# Patient Record
Sex: Male | Born: 1945 | Race: White | Hispanic: No | Marital: Married | Smoking: Former smoker
Health system: Southern US, Community
[De-identification: ages and names within clinical notes are randomized; demographics above are authoritative.]

## PROBLEM LIST (undated history)

## (undated) DIAGNOSIS — E785 Hyperlipidemia, unspecified: Secondary | ICD-10-CM

## (undated) DIAGNOSIS — N189 Chronic kidney disease, unspecified: Secondary | ICD-10-CM

## (undated) DIAGNOSIS — E119 Type 2 diabetes mellitus without complications: Secondary | ICD-10-CM

## (undated) DIAGNOSIS — K56609 Unspecified intestinal obstruction, unspecified as to partial versus complete obstruction: Secondary | ICD-10-CM

## (undated) DIAGNOSIS — N200 Calculus of kidney: Secondary | ICD-10-CM

## (undated) DIAGNOSIS — M1990 Unspecified osteoarthritis, unspecified site: Secondary | ICD-10-CM

## (undated) DIAGNOSIS — I2510 Atherosclerotic heart disease of native coronary artery without angina pectoris: Secondary | ICD-10-CM

## (undated) DIAGNOSIS — I10 Essential (primary) hypertension: Secondary | ICD-10-CM

## (undated) HISTORY — PX: ELBOW SURGERY: SHX618

## (undated) HISTORY — DX: Unspecified intestinal obstruction, unspecified as to partial versus complete obstruction: K56.609

## (undated) HISTORY — PX: ESOPHAGOGASTRODUODENOSCOPY: SHX1529

## (undated) HISTORY — DX: Unspecified osteoarthritis, unspecified site: M19.90

## (undated) HISTORY — PX: CARDIAC CATHETERIZATION: SHX172

## (undated) HISTORY — PX: FOOT SURGERY: SHX648

---

## 1998-05-14 ENCOUNTER — Ambulatory Visit (HOSPITAL_BASED_OUTPATIENT_CLINIC_OR_DEPARTMENT_OTHER): Admission: RE | Admit: 1998-05-14 | Discharge: 1998-05-14 | Payer: Self-pay | Admitting: Orthopedic Surgery

## 1999-04-25 ENCOUNTER — Encounter: Payer: Self-pay | Admitting: Emergency Medicine

## 1999-04-26 ENCOUNTER — Encounter: Payer: Self-pay | Admitting: Emergency Medicine

## 1999-04-26 ENCOUNTER — Inpatient Hospital Stay (HOSPITAL_COMMUNITY): Admission: EM | Admit: 1999-04-26 | Discharge: 1999-04-29 | Payer: Self-pay | Admitting: Emergency Medicine

## 1999-04-29 ENCOUNTER — Encounter: Payer: Self-pay | Admitting: Specialist

## 1999-05-10 ENCOUNTER — Encounter: Payer: Self-pay | Admitting: Specialist

## 1999-05-11 ENCOUNTER — Inpatient Hospital Stay (HOSPITAL_COMMUNITY): Admission: EM | Admit: 1999-05-11 | Discharge: 1999-05-13 | Payer: Self-pay | Admitting: Specialist

## 2000-07-22 ENCOUNTER — Ambulatory Visit (HOSPITAL_BASED_OUTPATIENT_CLINIC_OR_DEPARTMENT_OTHER): Admission: RE | Admit: 2000-07-22 | Discharge: 2000-07-22 | Payer: Self-pay | Admitting: Internal Medicine

## 2003-01-07 ENCOUNTER — Emergency Department (HOSPITAL_COMMUNITY): Admission: EM | Admit: 2003-01-07 | Discharge: 2003-01-07 | Payer: Self-pay | Admitting: *Deleted

## 2003-11-26 ENCOUNTER — Ambulatory Visit (HOSPITAL_BASED_OUTPATIENT_CLINIC_OR_DEPARTMENT_OTHER): Admission: RE | Admit: 2003-11-26 | Discharge: 2003-11-26 | Payer: Self-pay | Admitting: Urology

## 2003-11-26 ENCOUNTER — Ambulatory Visit (HOSPITAL_COMMUNITY): Admission: RE | Admit: 2003-11-26 | Discharge: 2003-11-26 | Payer: Self-pay | Admitting: Urology

## 2004-06-04 ENCOUNTER — Ambulatory Visit (HOSPITAL_COMMUNITY): Admission: RE | Admit: 2004-06-04 | Discharge: 2004-06-04 | Payer: Self-pay | Admitting: Gastroenterology

## 2004-06-12 ENCOUNTER — Emergency Department (HOSPITAL_COMMUNITY): Admission: EM | Admit: 2004-06-12 | Discharge: 2004-06-12 | Payer: Self-pay | Admitting: Emergency Medicine

## 2004-06-16 ENCOUNTER — Ambulatory Visit (HOSPITAL_BASED_OUTPATIENT_CLINIC_OR_DEPARTMENT_OTHER): Admission: RE | Admit: 2004-06-16 | Discharge: 2004-06-16 | Payer: Self-pay | Admitting: Urology

## 2004-06-16 ENCOUNTER — Ambulatory Visit (HOSPITAL_COMMUNITY): Admission: RE | Admit: 2004-06-16 | Discharge: 2004-06-16 | Payer: Self-pay | Admitting: Urology

## 2005-10-11 ENCOUNTER — Ambulatory Visit (HOSPITAL_COMMUNITY): Admission: RE | Admit: 2005-10-11 | Discharge: 2005-10-11 | Payer: Self-pay | Admitting: Internal Medicine

## 2009-07-17 ENCOUNTER — Emergency Department (HOSPITAL_BASED_OUTPATIENT_CLINIC_OR_DEPARTMENT_OTHER): Admission: EM | Admit: 2009-07-17 | Discharge: 2009-07-17 | Payer: Self-pay | Admitting: Emergency Medicine

## 2009-07-17 ENCOUNTER — Ambulatory Visit: Payer: Self-pay | Admitting: Radiology

## 2010-12-23 LAB — CBC
HCT: 42.6 % (ref 39.0–52.0)
Hemoglobin: 14.4 g/dL (ref 13.0–17.0)
MCHC: 33.8 g/dL (ref 30.0–36.0)
MCV: 92 fL (ref 78.0–100.0)
Platelets: 155 10*3/uL (ref 150–400)
RBC: 4.63 MIL/uL (ref 4.22–5.81)
RDW: 13.2 % (ref 11.5–15.5)
WBC: 5.8 10*3/uL (ref 4.0–10.5)

## 2010-12-23 LAB — URINE MICROSCOPIC-ADD ON

## 2010-12-23 LAB — URINALYSIS, ROUTINE W REFLEX MICROSCOPIC
Bilirubin Urine: NEGATIVE
Glucose, UA: 250 mg/dL — AB
Ketones, ur: 15 mg/dL — AB
Leukocytes, UA: NEGATIVE
Nitrite: NEGATIVE
Protein, ur: NEGATIVE mg/dL
Specific Gravity, Urine: 1.02 (ref 1.005–1.030)
Urobilinogen, UA: 0.2 mg/dL (ref 0.0–1.0)
pH: 6 (ref 5.0–8.0)

## 2010-12-23 LAB — DIFFERENTIAL
Basophils Absolute: 0.1 10*3/uL (ref 0.0–0.1)
Basophils Relative: 1 % (ref 0–1)
Eosinophils Absolute: 0.3 10*3/uL (ref 0.0–0.7)
Eosinophils Relative: 5 % (ref 0–5)
Lymphocytes Relative: 26 % (ref 12–46)
Lymphs Abs: 1.5 10*3/uL (ref 0.7–4.0)
Monocytes Absolute: 0.4 10*3/uL (ref 0.1–1.0)
Monocytes Relative: 6 % (ref 3–12)
Neutro Abs: 3.5 10*3/uL (ref 1.7–7.7)
Neutrophils Relative %: 62 % (ref 43–77)

## 2010-12-23 LAB — BASIC METABOLIC PANEL
BUN: 16 mg/dL (ref 6–23)
CO2: 26 mEq/L (ref 19–32)
Calcium: 9.4 mg/dL (ref 8.4–10.5)
Chloride: 106 mEq/L (ref 96–112)
Creatinine, Ser: 0.9 mg/dL (ref 0.4–1.5)
GFR calc Af Amer: >60 mL/min (ref >60)
GFR calc non Af Amer: >60 mL/min (ref >60)
Glucose, Bld: 219 mg/dL — ABNORMAL HIGH (ref 70–99)
Potassium: 4.2 mEq/L (ref 3.5–5.1)
Sodium: 142 mEq/L (ref 135–145)

## 2010-12-23 LAB — URINE CULTURE
Colony Count: NO GROWTH
Culture: NO GROWTH

## 2011-02-04 NOTE — Consult Note (Signed)
NAME:  Alex Mccarty, Alex Mccarty              ACCOUNT NO.:  [id number]   MEDICAL RECORD NO.:  [id number]          PATIENT TYPE:  EMS   LOCATION:  ED                           FACILITY:  [facility]   PHYSICIAN:  Sigmund I. Gaynelle Arabian, M.D.DATE OF BIRTH:  1946/04/14   DATE OF CONSULTATION:  06/12/2004  DATE OF DISCHARGE:                                   CONSULTATION   SUBJECTIVE:  This 65 year old married white male from [place], referred  by [facility] for evaluation of recurrent kidney stones.  The patient  awoke today with severe right flank pain and right lower quadrant pain.  His  pain level was 9/10, with nausea but no vomiting.  He was seen in the  emergency room.   PAST MEDICAL HISTORY:  1.  Kidney stones operated on by Dr. Serita Butcher in the past.  2.  GERD.  3.  Elevated cholesterol.  4.  Type 2 diabetes.   MEDICATIONS:  1.  Avandamet 2/500 mg b.i.d.  2.  Prevacid 30 mg/day.  3.  Mobic 15 mg/day.  4.  Vytorin 80 mg/day.  5.  Cosamine.   HABITS:  Alcohol is none.  Tobacco is none.   SOCIAL HISTORY:  The patient is married and lives at home with his wife.   ALLERGIES:  1.  PENICILLIN.  2.  CODEINE.   PHYSICAL EXAMINATION:  GENERAL:  A well-developed well-nourished white male  in no acute distress.  VITAL SIGNS:  Blood pressure 154/88, weight 196, pulse 77, temperature 97,  O2 saturation 98%.  NECK:  Supple and nontender.  No nodes.  CHEST:  Clear to P&A.  COR:  S1 and S2 normal without heaves, thrills, or murmurs.  ABDOMEN:  Soft, plus bowel sounds.  No organomegaly.  Without masses.  LYMPH NODES:  Nonpalpable.  GENITALIA:  Shows normal male external genitalia with testicles descended  bilaterally measuring 4 x 4 cm each.  The epididymis is normal.  The scrotum  is normal.  RECTAL:  Not indicated this examination.  EXTREMITIES:  No cyanosis or edema.  PSYCHOLOGIC:  Normal orientation to time, person, and place.   LABORATORIES:  A CT scan shows a 4.5 mm right lower  pole stone and a 4 mm  left lower pole stone.  There is also a 4 mm right UVJ stone.  There is mild  hydronephrosis.   ASSESSMENT:  The patient feels certainly better and I believe that his stone  may have turned.  He has not passed a stone in the emergency room.  He has  mild hydronephrosis and I have offered surgical relief of this kidney stone  versus conservative therapy with Flomax and Toradol.  The patient would like  to try to pass the stone tonight.  If he has another episode of colic he  will return to the emergency room.  However, if he does well, he will follow  up with Dr. Serita Butcher on Monday.      SIT/MEDQ  D:  06/12/2004  T:  06/13/2004  Job:  [id number]   cc:   Shanda Bumps., M.D.  [address] [place]  [place] [address]  Fax: [phone]

## 2011-02-04 NOTE — Op Note (Signed)
NAME:  Alex, Mccarty              ACCOUNT NO.:  [id number]   MEDICAL RECORD NO.:  [id number]          PATIENT TYPE:  AMB   LOCATION:  NESC                         FACILITY:  [place]:  Shanda Bumps., M.D.DATE OF BIRTH:  [birth year]   DATE OF PROCEDURE:  06/16/2004  DATE OF DISCHARGE:                                 OPERATIVE REPORT   PREOPERATIVE DIAGNOSIS:  Right distal ureteral stones with obstruction.   POSTOPERATIVE DIAGNOSIS:  Right distal ureteral stones with obstruction.   OPERATION:  1.  Cysto.  2.  Retrograde.  3.  Right ureteroscopy with stone extraction and insertion of right ureteral      stent.   ANESTHESIA:  General.   SURGEON:  Corky Downs, M.D.   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  They each measure about 5 mm  down the distal right ureter.  Had ureteroscopy March 2005 with a stricture  on the left side that had to be stented for 2 weeks.  He has type 2  diabetes.  He has had stones 25 and 30 years ago and enters now to have  these removed by ureteroscopy.   The patient was placed on the operating table in dorsal lithotomy position.  After satisfactory induction of general anesthesia, was prepped with  Betadine in the usual sterile fashion and given IV Cipro.  The  She 21  panendoscope was used to inspect the anterior urethra; no strictures were  seen.  The prostate lateral lobes were kissing in midline, and the bladder  was entered.  No bladder mucosal lesions were seen.  The right orifice was a  little bit small, and I had to use a Glidewire through an open-ended 6  ureteral catheter to get this into the orifice and then remove the  Glidewire.  An occlusive retrograde demonstrated the stones about 4-5 cm  from the UV junction on the right side with some moderate hydronephrosis.  I  passed a guidewire through the open-end catheter under  fluoroscopy up to the  level of the kidney and then removed the open-end catheter.  Over the  guidewire, I passed a 4 cm ureteral balloon dilator, passed this up to the  stone, and then inflated to 12 atmospheres for 3 times minutes.  The balloon  was then deflated and removed, leaving the guidewire as a safety wire in  place.  The 6 short ureteroscope was then passed under direct vision into  the right ureter and past the stones.  The most proximal stone was a little  smaller, had a dark part to it, and I was able to engage this with a nitinol  basket and then removed this intact.  Another passage with the scope, I was  able to remove the other more distal stone which was more yellow in  appearance.  After this was done, a third pass was done demonstrating no  other stones were seen.  He did not seem to have a stricture on this side,  and the scope  was then removed.  A cystoscope was then back-loaded over the  guidewire, and a 6 French x 26 cm length double-J ureteral stent with a  string on the distal end was then passed over the guidewire under  fluoroscopy up to the level of the kidney, and the guidewire was removed  with a nice coil in the renal pelvis and another coil in the bladder.  The  bladder was drained.  The scope was then removed, leaving the string coming  out  through the urethra which was then taped to his penis.  The patient was  given some Toradol and a B&O suppository and then taken to the recovery room  in good condition to be later discharged as an outpatient and will come back  in 2 days for string stent removal.      HMK/MEDQ  D:  06/16/2004  T:  06/16/2004  Job:  [id number]

## 2011-02-04 NOTE — Op Note (Signed)
NAME:  Alex Mccarty, Alex Mccarty                        ACCOUNT NO.:  [id number]   MEDICAL RECORD NO.:  [id number]                   PATIENT TYPE:  AMB   LOCATION:  N[facility]C                                 [facility]ACILITY:  [facility]   PHYSICIAN:  Shanda Bumps., M.D.      DATE OF BIRTH:  03-Mar-1946   DATE OF PROCEDURE:  11/26/2003  DATE OF DISCHARGE:                                 OPERATIVE REPORT   PREOPERATIVE DIAGNOSES:  Left distal ureteral stone with obstruction.   POSTOPERATIVE DIAGNOSES:  Left distal ureteral stone with obstruction. Left  ureteral stricture distal.   PROCEDURE:  Cystoscopy retrograde, left ureteroscopy with stone removal and  dilation and ureteral stricture with stent.   ANESTHESIA:  General.   SURGEON:  Corky Downs, M.D.   BRIEF HISTORY:  This 65 year old patient is admitted for removal of a 4-5 mm  stone in the distal left ureter with obstruction. He presented in mid  February with urethral bleeding but no pain until about five days ago.  CT  showed a right renal stone, a large right renal cyst and a distal left stone  with hydronephrosis.  He has an allergy to PENICILLIN and CODEINE makes him  nauseated. He comes in now for removal of this stone which is causing  persistent pain.   The patient was placed on the operating table in the dorsal lithotomy  position and after satisfactory induction of general anesthesia was prepped  and draped with Betadine in the usual sterile fashion. A 22 panendoscope was  inserted, no anterior stricture seen. The posterior urethra was  nonobstructing and the bladder was entered. The left orifice was somewhat  small and there was a little bit of blood seen coming from this orifice. I  had to put a guidewire through the six open ended ureteral catheter to be  able to get this into the ureter and an occlusive retrograde was done. This  demonstrated a stone in the distal ureter but a stricture just distal to the  stone.  I  was able to get a Glidewire through the stricture and then was  able to pass the open ended catheter through the stricture up pass the  stone.  The Glidewire was then removed and a guidewire was then inserted.  This was passed up to the level of the kidney under fluoroscopy.  The open  ended catheter was then removed and a 4 cm ureteral balloon dilator was then  passed into the orifice. Under fluoroscopic guidance, the balloon was  inflated, first to 12 and the to 14 and then finally 18 atmospheres. There  was still a waist present of the balloon that would completely fill. This  was probably at the area of the stricture.  After three timed minutes, the  balloon was deflated, pulled back and again the stricture was dilated again  at this point at the distal end of the balloon. Again another three minutes  timed ureteral dilation  was done. The ureteral balloon dilator was removed  leaving the guidewire in place. A six short ureteroscope was then passed  into the orifice and I could see the stricture but I was able to get through  it fairly easily with the scope. I was able to get pass the stone and then  with a nitinol basket was able to engage the stone and remove it intact. I  passed another time with the scope and I was worried about the stricture  which looked somewhat ratty and I was afraid would probably cause some  obstruction postop if I didn't leave a stent and at least dilate this with  the ureteral stent.  Over the guidewire, a 6 French x 26 cm length double J  ureteral stent was passed up to the level of the kidney under fluoroscopy.  The guidewire was removed with a good coil in the stent both in the kidney,  renal pelvis and  then in the bladder. The bladder was then drained, he was given B&O  suppository, taken to the recovery room in good condition. Plan to leave the  stent for at least a week and to dilate the stricture a little further and  then remove it in the office at a  later time. The stone was sent for  analysis.                                               Shanda Bumps., M.D.    HMK/MEDQ  D:  11/26/2003  T:  11/26/2003  Job:  [id number]

## 2011-02-04 NOTE — Op Note (Signed)
NAME:  Alex Mccarty, Alex Mccarty                        ACCOUNT NO.:  [id number]   MEDICAL RECORD NO.:  [id number]                   PATIENT TYPE:  AMB   LOCATION:  ENDO                                 FACILITY:  [facility]   PHYSICIAN:  James L. Rolla Flatten., M.D.          DATE OF BIRTH:  Jul 30, 1946   DATE OF PROCEDURE:  06/04/2004  DATE OF DISCHARGE:                                 OPERATIVE REPORT   PROCEDURE:  Colonoscopy.   MEDICATIONS GIVEN:  Fentanyl 100 mcg, Versed 6 mg IV.   INDICATIONS FOR PROCEDURE:  Colon cancer screening.   INSTRUMENT USED:  Olympus pediatric colonoscope.   DESCRIPTION OF PROCEDURE:  The procedure was explained and patient consent  obtained.  With the patient in the left lateral decubitus position, the  Olympus pediatric scope was inserted and advanced.  The cecum was easily  reached.  The ileocecal valve and appendiceal orifice were seen, the scope  was withdrawn.  The cecum, ascending colon, transverse colon, splenic  flexure, descending and sigmoid colon were seen well.  No polyps or other  lesions were seen.  The rectum was free of polyps.  The scope was withdrawn.  The patient tolerated the procedure well.   ASSESSMENT:  Normal screen colonoscopy, V76.51.   RECOMMENDATIONS:  Yearly hemoccults and repeat procedure in ten years or as  needed.                                               James L. Rolla Flatten., M.D.    Jaynie Bream  D:  06/04/2004  T:  06/04/2004  Job:  [id number]   cc:   Bevelyn Buckles, M.D.

## 2013-03-09 ENCOUNTER — Encounter (HOSPITAL_BASED_OUTPATIENT_CLINIC_OR_DEPARTMENT_OTHER): Payer: Self-pay

## 2013-03-09 ENCOUNTER — Emergency Department (HOSPITAL_BASED_OUTPATIENT_CLINIC_OR_DEPARTMENT_OTHER)
Admission: EM | Admit: 2013-03-09 | Discharge: 2013-03-09 | Disposition: A | Discharge status: Home / Self Care | Payer: BC Managed Care – PPO | Attending: Emergency Medicine | Admitting: Emergency Medicine

## 2013-03-09 ENCOUNTER — Emergency Department (HOSPITAL_BASED_OUTPATIENT_CLINIC_OR_DEPARTMENT_OTHER): Payer: BC Managed Care – PPO

## 2013-03-09 DIAGNOSIS — N179 Acute kidney failure, unspecified: Secondary | ICD-10-CM

## 2013-03-09 DIAGNOSIS — E785 Hyperlipidemia, unspecified: Secondary | ICD-10-CM | POA: Insufficient documentation

## 2013-03-09 DIAGNOSIS — R110 Nausea: Secondary | ICD-10-CM | POA: Insufficient documentation

## 2013-03-09 DIAGNOSIS — E119 Type 2 diabetes mellitus without complications: Secondary | ICD-10-CM | POA: Insufficient documentation

## 2013-03-09 DIAGNOSIS — N200 Calculus of kidney: Secondary | ICD-10-CM | POA: Insufficient documentation

## 2013-03-09 DIAGNOSIS — N189 Chronic kidney disease, unspecified: Secondary | ICD-10-CM | POA: Insufficient documentation

## 2013-03-09 DIAGNOSIS — Z79899 Other long term (current) drug therapy: Secondary | ICD-10-CM | POA: Insufficient documentation

## 2013-03-09 DIAGNOSIS — I129 Hypertensive chronic kidney disease with stage 1 through stage 4 chronic kidney disease, or unspecified chronic kidney disease: Secondary | ICD-10-CM | POA: Insufficient documentation

## 2013-03-09 DIAGNOSIS — Z880 Allergy status to penicillin: Secondary | ICD-10-CM | POA: Insufficient documentation

## 2013-03-09 HISTORY — DX: Calculus of kidney: N20.0

## 2013-03-09 HISTORY — DX: Hyperlipidemia, unspecified: E78.5

## 2013-03-09 HISTORY — DX: Chronic kidney disease, unspecified: N18.9

## 2013-03-09 HISTORY — DX: Essential (primary) hypertension: I10

## 2013-03-09 HISTORY — DX: Type 2 diabetes mellitus without complications: E11.9

## 2013-03-09 LAB — URINALYSIS, ROUTINE W REFLEX MICROSCOPIC
Bilirubin Urine: NEGATIVE
Glucose, UA: 250 mg/dL — AB
Ketones, ur: NEGATIVE mg/dL
Nitrite: NEGATIVE
Protein, ur: NEGATIVE mg/dL
Specific Gravity, Urine: 1.02 (ref 1.005–1.030)
Urobilinogen, UA: 0.2 mg/dL (ref 0.0–1.0)
pH: 5 (ref 5.0–8.0)

## 2013-03-09 LAB — URINE MICROSCOPIC-ADD ON

## 2013-03-09 LAB — CBC WITH DIFFERENTIAL/PLATELET
Basophils Absolute: 0 10*3/uL (ref 0.0–0.1)
Basophils Relative: 0 % (ref 0–1)
Eosinophils Absolute: 0.2 10*3/uL (ref 0.0–0.7)
Eosinophils Relative: 2 % (ref 0–5)
HCT: 37.3 % — ABNORMAL LOW (ref 39.0–52.0)
Hemoglobin: 12.6 g/dL — ABNORMAL LOW (ref 13.0–17.0)
Lymphocytes Relative: 14 % (ref 12–46)
Lymphs Abs: 1.3 10*3/uL (ref 0.7–4.0)
MCH: 31.3 pg (ref 26.0–34.0)
MCHC: 33.8 g/dL (ref 30.0–36.0)
MCV: 92.8 fL (ref 78.0–100.0)
Monocytes Absolute: 0.8 10*3/uL (ref 0.1–1.0)
Monocytes Relative: 9 % (ref 3–12)
Neutro Abs: 6.7 10*3/uL (ref 1.7–7.7)
Neutrophils Relative %: 75 % (ref 43–77)
Platelets: 167 10*3/uL (ref 150–400)
RBC: 4.02 MIL/uL — ABNORMAL LOW (ref 4.22–5.81)
RDW: 12.8 % (ref 11.5–15.5)
WBC: 8.9 10*3/uL (ref 4.0–10.5)

## 2013-03-09 LAB — BASIC METABOLIC PANEL
BUN: 26 mg/dL — ABNORMAL HIGH (ref 6–23)
CO2: 23 mEq/L (ref 19–32)
Calcium: 10 mg/dL (ref 8.4–10.5)
Chloride: 107 mEq/L (ref 96–112)
Creatinine, Ser: 2.4 mg/dL — ABNORMAL HIGH (ref 0.50–1.35)
GFR calc Af Amer: 31 mL/min — ABNORMAL LOW (ref >90)
GFR calc non Af Amer: 27 mL/min — ABNORMAL LOW (ref >90)
Glucose, Bld: 236 mg/dL — ABNORMAL HIGH (ref 70–99)
Potassium: 4.4 mEq/L (ref 3.5–5.1)
Sodium: 140 mEq/L (ref 135–145)

## 2013-03-09 MED ORDER — FENTANYL CITRATE 0.05 MG/ML IJ SOLN
50.0000 ug | Freq: Once | INTRAMUSCULAR | Status: AC
  Administered 2013-03-09: 50 ug via INTRAVENOUS
  Filled 2013-03-09: qty 2

## 2013-03-09 MED ORDER — PROMETHAZINE HCL 25 MG/ML IJ SOLN
12.5000 mg | Freq: Once | INTRAMUSCULAR | Status: AC
  Administered 2013-03-09: 12.5 mg via INTRAVENOUS
  Filled 2013-03-09: qty 1

## 2013-03-09 MED ORDER — OXYCODONE-ACETAMINOPHEN 5-325 MG PO TABS
1.0000 | ORAL_TABLET | Freq: Once | ORAL | Status: AC
  Administered 2013-03-09: 1 via ORAL
  Filled 2013-03-09 (×2): qty 1

## 2013-03-09 MED ORDER — ONDANSETRON HCL 4 MG/2ML IJ SOLN
4.0000 mg | Freq: Once | INTRAMUSCULAR | Status: AC
  Administered 2013-03-09: 4 mg via INTRAVENOUS
  Filled 2013-03-09: qty 2

## 2013-03-09 NOTE — ED Provider Notes (Signed)
I saw and evaluated the patient, reviewed the resident's note and I agree with the findings and plan.   .Face to face Exam:  General:  Awake HEENT:  Atraumatic Resp:  Normal effort Abd:  Nondistended Neuro:No focal weakness    Dot Lanes, MD 03/09/13 1514

## 2013-03-09 NOTE — ED Provider Notes (Signed)
History     CSN: [id number] Arrival date & time 03/09/13  1144 First MD Initiated Contact with Patient 03/09/13 1212      Chief Complaint  Patient presents with  . Abdominal Pain   HPI 68 year old male with history of nephrolithiasis presenting with left sided flank pain.   Left sided flank pain for 1.5 days. Known history kidney stones. Associated with nausea. Sharp pain comes in waves up to 8/10.  Last night did have some slight epigastric pain as well but that resolved after an hour. This morning pain still in left flank (gone from epigastric area and radiating to LLQ and groin. Feels like typical kidney stones. No hematuria. No vomiting. Normal bowel movements. Afebrile. No pain, numbness, tingling in legs bilaterally. No back pain. No dysuria or polyuria.   Spoke with [facility] who mentions baseline Cr at approximately 1.6 and recent KUB in February that did not reveal stones.   Past Medical History  Diagnosis Date  . Kidney stones   . Hypertension   . Hyperlipidemia   . Diabetes mellitus   . CKD (chronic kidney disease)     unkknown stage per patient   Past Surgical History  Procedure Laterality Date  . Foot surgery      crushed heel  . Elbow surgery      ulnar nerve repair    Family History  Problem Relation Age of Onset  . Kidney Stones Mother     History  Substance Use Topics  . Smoking status: Never Smoker   . Smokeless tobacco: Never Used  . Alcohol Use: Yes     Comment: occasional   Review of Systems A full 10 point review of symptoms was performed and was negative except as noted in HPI.   Allergies  Codeine; Penicillins; and Sulfa antibiotics  Home Medications   Current Outpatient Rx  Name  Route  Sig  Dispense  Refill  . Atorvastatin Calcium (LIPITOR PO)   Oral   Take by mouth.         [name] glucosamine-chondroitin 500-400 MG tablet   Oral   Take 1 tablet by mouth 3 (three) times daily.         . lansoprazole (PREVACID) 30 MG  capsule   Oral   Take 30 mg by mouth daily.         [name] METFORMIN HCL PO   Oral   Take by mouth.         [name] PRESCRIPTION MEDICATION      Rx medication for hypertension         No longer taking metformin. New diabetic medication that he has not yet started taking.   BP 176/81  Pulse 75  Temp(Src) 98 F (36.7 C) (Oral)  Resp 18  Ht 5\' 9"  (1.753 m)  Wt 176 lb (79.833 kg)  BMI 25.98 kg/m2  SpO2 99% Repeat BP 145/78 when not acutely in pain Physical Exam  Constitutional: He is oriented to person, place, and time. He appears well-developed and well-nourished.  HENT:  Head: Normocephalic and atraumatic.  Eyes: EOM are normal. Pupils are equal, round, and reactive to light.  Neck: Normal range of motion. Neck supple.  Cardiovascular: Normal rate and regular rhythm.  Exam reveals no gallop and no friction rub.   No murmur heard. Pulmonary/Chest: Effort normal and breath sounds normal. He has no wheezes. He has no rales.  Abdominal: Soft. Bowel sounds are normal. There is no tenderness. There  is no rebound.  Musculoskeletal: Normal range of motion. He exhibits tenderness (slight left sided CVA discomfort). He exhibits no edema.  Neurological: He is alert and oriented to person, place, and time. He exhibits normal muscle tone. Coordination normal.  Skin: Skin is warm and dry.   ED Course  Procedures (including critical care time)  Labs Reviewed  CBC WITH DIFFERENTIAL - Abnormal; Notable for the following:    RBC 4.02 (*)    Hemoglobin 12.6 (*)    HCT 37.3 (*)    All other components within normal limits  URINALYSIS, ROUTINE W REFLEX MICROSCOPIC - Abnormal; Notable for the following:    APPearance CLOUDY (*)    Glucose, UA 250 (*)    Hgb urine dipstick LARGE (*)    Leukocytes, UA TRACE (*)    All other components within normal limits  BASIC METABOLIC PANEL - Abnormal; Notable for the following:    Glucose, Bld 236 (*)    BUN 26 (*)    Creatinine, Ser 2.40 (*)    GFR  calc non Af Amer 27 (*)    GFR calc Af Amer 31 (*)    All other components within normal limits  URINE MICROSCOPIC-ADD ON - Abnormal; Notable for the following:    Squamous Epithelial / LPF FEW (*)    Bacteria, UA MANY (*)    All other components within normal limits  URINE CULTURE   Ct Abdomen Pelvis Wo Contrast  03/09/2013   *RADIOLOGY REPORT*  Clinical Data: Abdominal pain  CT ABDOMEN AND PELVIS WITHOUT CONTRAST  Technique:  Multidetector CT imaging of the abdomen and pelvis was performed following the standard protocol without intravenous contrast.  Comparison: None  Findings: Clustered tree in bud nodules are noted in the right lung base.  No pleural or pericardial effusion noted.  There is no focal liver abnormality identified.  The gallbladder appears normal.  No biliary dilatation.  Normal appearance of the pancreas.  The spleen is normal.  The adrenal glands are both unremarkable.  Multiple right renal calculi are noted.  The largest is in the upper pole measuring 9 mm, image 27/series two.  There is a large cyst within the inferior pole of the right kidney measuring 7.9 cm.  Multiple left renal calculi are identified.  The largest left renal stone is in the inferior pole measuring 3 mm, image 39/series 2.  A cyst is identified within the upper pole of the left kidney measuring 3.8 cm.  Left-sided hydronephrosis and hydroureter is identified. Within the distal left ureter there is a 4 mm stone, image 78/series 2.  Urinary bladder appears normal.  Prostate gland and seminal vesicles are unremarkable.  Calcified atherosclerotic disease affects the abdominal aorta. There is no aneurysm.  No upper abdominal adenopathy.  No pelvic or inguinal adenopathy noted.  There is a small hiatal hernia.  The small bowel loops appear unremarkable.  Normal appearance of the appendix.  The proximal colon appears normal.  A few scattered distal colonic diverticula are noted.  Review of the visualized bony structures  is significant for mild multilevel spondylosis.  No aggressive lytic or sclerotic bone lesions.  IMPRESSION:  1.  Left-sided hydronephrosis and hydroureter.  There is a 4 mm stone in the distal left ureter. 2.  Bilateral nephrolithiasis. 3.  Bilateral renal cysts which are incompletely characterized without IV contrast material. 4.  Clustered tree in bud nodules are identified in the right base which are favored to represent the sequelae of infectious  or inflammatory bronchiolitis.   Original Report Authenticated By: Kerby Moors, M.D.   1. Nephrolithiasis   2. Acute kidney injury   3. Chronic kidney disease, unspecified stage    MDM  Spoke with [facility] Dr. Thomasene Mohair who given AKI and 85mm stone in left ureter with hydronephrosis and hydroureter suggests patient come to [facility] as he will need intervention. Patient is in stable medical condition and is safe for transport by personal vehicle to [place] with plan to go immediately to [facility], patient advised NPO. Will print labs and give disc of images for patient to take to [facility] with him.         Marin Olp, MD 03/09/13 [phone]

## 2013-03-09 NOTE — ED Notes (Signed)
Pt states that he has hx of kidney stones, onset of pain last night about midnight, states that he had L flank pain, and now has L lower abd pain.  Pt states that he has had severe kidney stones since last ER visit for same, but passed them without difficulty.  Pt presents today for severe pain.  C/o mild nausea.

## 2013-03-11 LAB — URINE CULTURE
Colony Count: NO GROWTH
Culture: NO GROWTH

## 2013-05-08 ENCOUNTER — Ambulatory Visit
Admission: RE | Admit: 2013-05-08 | Discharge: 2013-05-08 | Disposition: A | Discharge status: Home / Self Care | Payer: BC Managed Care – PPO | Source: Ambulatory Visit | Attending: Chiropractic Medicine | Admitting: Chiropractic Medicine

## 2013-05-08 ENCOUNTER — Other Ambulatory Visit: Payer: Self-pay | Admitting: Chiropractic Medicine

## 2013-05-08 DIAGNOSIS — M545 Low back pain, unspecified: Secondary | ICD-10-CM

## 2014-09-09 ENCOUNTER — Encounter: Payer: Self-pay | Admitting: Neurology

## 2014-09-09 ENCOUNTER — Ambulatory Visit (INDEPENDENT_AMBULATORY_CARE_PROVIDER_SITE_OTHER): Payer: Medicare HMO | Admitting: Neurology

## 2014-09-09 VITALS — BP 124/74 | HR 78 | Ht 70.0 in | Wt 187.8 lb

## 2014-09-09 DIAGNOSIS — R51 Headache: Secondary | ICD-10-CM

## 2014-09-09 DIAGNOSIS — G453 Amaurosis fugax: Secondary | ICD-10-CM

## 2014-09-09 DIAGNOSIS — G44229 Chronic tension-type headache, not intractable: Secondary | ICD-10-CM

## 2014-09-09 DIAGNOSIS — H8110 Benign paroxysmal vertigo, unspecified ear: Secondary | ICD-10-CM | POA: Insufficient documentation

## 2014-09-09 DIAGNOSIS — H9312 Tinnitus, left ear: Secondary | ICD-10-CM

## 2014-09-09 DIAGNOSIS — R42 Dizziness and giddiness: Secondary | ICD-10-CM

## 2014-09-09 DIAGNOSIS — R519 Headache, unspecified: Secondary | ICD-10-CM | POA: Insufficient documentation

## 2014-09-09 MED ORDER — MECLIZINE HCL 25 MG PO TABS: 25.0000 mg | ORAL_TABLET | Freq: Three times a day (TID) | ORAL | Status: DC | PRN

## 2014-09-09 NOTE — Progress Notes (Signed)
[facility]    Provider:  Dr Jaynee Eagles Referring Provider: Delilah Shan, MD Primary Care Physician:  No primary care provider on file.  CC:  Vision loss  HPI:  Alex Mccarty is a 68 y.o. male here as a referral from Dr. Claiborne Billings for vision loss. PMHx HTN, HLD, DM, CKD. He lost his vision for an hour, right eye and loss of the temporal field. Happened 3 weeks ago. Has not happened again. But a few days earlier he had pressure in the ears with muffled hearing which lasted for 30 minutes twice in one day. Denies any further vision changes. It was just in one eye. Everything was "invisible" on that side of the eye. He has chronic headaches described as pressure in his head and a sore spot on the top of the head. The headaches happen daily and they last a few minutes to an hour. Headaches are not positional or dependent on time of day. He gets dizzy if he bends down. Sometimes when he lays back he gets vertigo, the room is spinning. Vertigo happens even when he is standing still, not dependent on head movement, the spinning can last all day long and he has to stay home. He has vomited. He has seen a neurologist about a year ago and was given some exercises which did not help. He was given some meclizine but never tried it because the vertigo stopped. The vertigo is random, sometimes it is daily other times it doesn't happen for months. Last was a month ago and lasted for an hour. No migrainous features with the headaches. Pulsatile tinnitus in the left ear. No jaw claudication.   Review of Systems: Patient complains of symptoms per HPI as well as the following symptoms: no CP, no SOB. Pertinent negatives per HPI. All others negative.   History   Social History  . Marital Status: Married    Spouse Name: [name]     Number of Children: 1  . Years of Education: 12+   Occupational History  . Not on file.   Social History Main Topics  . Smoking status: Former Research scientist (life sciences)  .  Smokeless tobacco: Never Used  . Alcohol Use: 0.0 oz/week    0 Not specified per week     Comment: occasional  . Drug Use: No  . Sexual Activity: Not on file   Other Topics Concern  . Not on file   Social History Narrative   Lives in [place].    Patient lives at home with wife    Patient has 1 child   Patient works at [employer]    Patient has 12+ years of education        Family History  Problem Relation Age of Onset  . Kidney Stones Mother   . Heart disease Mother     Past Medical History  Diagnosis Date  . Kidney stones   . Hypertension   . Hyperlipidemia   . Diabetes mellitus   . CKD (chronic kidney disease)     unkknown stage per patient    Past Surgical History  Procedure Laterality Date  . Foot surgery      crushed heel  . Elbow surgery      ulnar nerve repair    Current Outpatient Prescriptions  Medication Sig Dispense Refill  . aspirin EC 81 MG tablet Take 81 mg by mouth.    . ASPIRIN LOW DOSE 81 MG EC tablet     .  atorvastatin (LIPITOR) 80 MG tablet     . Atorvastatin Calcium (LIPITOR PO) Take by mouth.    [name] glucosamine-chondroitin (GLUCOSAMINE-CHONDROITIN DS) 500-400 MG tablet Take by mouth.    [name] glucosamine-chondroitin 500-400 MG tablet Take 1 tablet by mouth 3 (three) times daily.    . lansoprazole (PREVACID) 30 MG capsule Take 30 mg by mouth daily.    [name] LANTUS SOLOSTAR 100 UNIT/ML Solostar Pen     . losartan (COZAAR) 100 MG tablet     . PRESCRIPTION MEDICATION Rx medication for hypertension    . Vitamin D, Ergocalciferol, (DRISDOL) 50000 UNITS CAPS capsule      No current facility-administered medications for this visit.    Allergies as of 09/09/2014 - Review Complete 09/09/2014  Allergen Reaction Noted  . Codeine Nausea And Vomiting 03/09/2013  . Penicillins  03/09/2013  . Sulfa antibiotics Nausea And Vomiting 03/09/2013    Vitals: BP 124/74 mmHg  Pulse 78  Ht '5\' 10"'  (1.778 m)  Wt 187 lb 12.8 oz (85.186 kg)  BMI 26.95  kg/m2 Last Weight:  Wt Readings from Last 1 Encounters:  09/09/14 187 lb 12.8 oz (85.186 kg)   Last Height:   Ht Readings from Last 1 Encounters:  09/09/14 '5\' 10"'  (1.778 m)    Physical exam: Exam: Gen: NAD, conversant, well nourised, well groomed                     CV: RRR, no MRG. No Carotid Bruits. No peripheral edema, warm, nontender Eyes: Conjunctivae clear without exudates or hemorrhage  Neuro: Detailed Neurologic Exam  Speech:    Speech is normal; fluent and spontaneous with normal comprehension.  Cognition:    The patient is oriented to person, place, and time;     recent and remote memory intact;     language fluent;     normal attention, concentration,     fund of knowledge Cranial Nerves:    The pupils are equal, round, and reactive to light. The fundi are normal and spontaneous venous pulsations are present. Visual fields are full to finger confrontation. Extraocular movements are intact. Trigeminal sensation is intact and the muscles of mastication are normal. The face is symmetric. The palate elevates in the midline. Voice is normal. Shoulder shrug is normal. The tongue has normal motion without fasciculations.   Coordination:    Normal finger to nose and heel to shin. Normal rapid alternating movements.   Gait:    Heel-toe and tandem gait are normal.   Motor Observation:    Mild postural and intention tremor Tone:    Normal muscle tone.    Posture:    Posture is normal. normal erect    Strength:    Strength is V/V in the upper and lower limbs.      Sensation:     Intact to LT  Reflex Exam:  DTR's:    Deep tendon reflexes in the upper and lower extremities are normal bilaterally.   Toes:    The toes are downgoing bilaterally.   Clonus:    Clonus is absent.      Assessment/Plan:  68 year old male with Transient right monocular hemifield vision loss. Happened once 3 weeks ago for an hour. He has vascular risk factors including DM, HTN, HLD. He  also describes chronic tension-type headaches and episodic vertigo. Neuro exam unremarkable.   - Follow closely with PCP to manage vascular risk factors - Daily ASA for stroke prevention. Continue statin goal LDL <  70 - MRI of the brain w/wo contrast. MRA of the head and neck. Looking for ischemic disease, vascular occlusion/disease, optic nerve compression and a variety of other etiologies for vision loss.  - CRP and ESR for temporal arteritis - meclizine prn for vertigo  Sarina Ill, MD  [facility] [address] [place], [place] [address]  Phone [phone] Fax [phone]

## 2014-09-09 NOTE — Patient Instructions (Addendum)
Overall you are doing fairly well but I do want to suggest a few things today:   Remember to drink plenty of fluid, eat healthy meals and do not skip any meals. Try to eat protein with a every meal and eat a healthy snack such as fruit or nuts in between meals. Try to keep a regular sleep-wake schedule and try to exercise daily, particularly in the form of walking, 20-30 minutes a day, if you can.   As far as your medications are concerned, I would like to suggest: continue aspirin and lipitor. Meclizine as needed for vertigo.  As far as diagnostic testing: MRi of the brain, MRA of the head, labs  I would like to see you back in 3 months, sooner if we need to. Please call us with any interim questions, concerns, problems, updates or refill requests.   Please also call us for any test results so we can go over those with you on the phone.  My clinical assistant and will answer any of your questions and relay your messages to me and also relay most of my messages to you.   Our phone number is [phone]. We also have an after hours call service for urgent matters and there is a physician on-call for urgent questions. For any emergencies you know to call 911 or go to the nearest emergency room

## 2014-09-10 ENCOUNTER — Encounter: Payer: Self-pay | Admitting: Neurology

## 2014-09-10 LAB — BASIC METABOLIC PANEL
BUN/Creatinine Ratio: 17 (ref 10–22)
BUN: 33 mg/dL — ABNORMAL HIGH (ref 8–27)
CO2: 22 mmol/L (ref 18–29)
Calcium: 9.4 mg/dL (ref 8.6–10.2)
Chloride: 107 mmol/L (ref 97–108)
Creatinine, Ser: 1.89 mg/dL — ABNORMAL HIGH (ref 0.76–1.27)
GFR calc Af Amer: 42 mL/min/{1.73_m2} — ABNORMAL LOW (ref >59)
GFR calc non Af Amer: 36 mL/min/{1.73_m2} — ABNORMAL LOW (ref >59)
Glucose: 118 mg/dL — ABNORMAL HIGH (ref 65–99)
Potassium: 4.8 mmol/L (ref 3.5–5.2)
Sodium: 142 mmol/L (ref 134–144)

## 2014-09-10 LAB — SEDIMENTATION RATE: Sed Rate: 5 mm/hr (ref 0–30)

## 2014-09-10 LAB — HIGH SENSITIVITY CRP: CRP, High Sensitivity: 0.66 mg/L (ref 0.00–3.00)

## 2014-09-15 NOTE — Progress Notes (Signed)
Called patient and advised of normal labs, same as a year ago.

## 2014-09-20 ENCOUNTER — Other Ambulatory Visit: Payer: BC Managed Care – PPO

## 2014-09-23 DIAGNOSIS — H53139 Sudden visual loss, unspecified eye: Secondary | ICD-10-CM | POA: Insufficient documentation

## 2014-09-24 ENCOUNTER — Ambulatory Visit
Admission: RE | Admit: 2014-09-24 | Discharge: 2014-09-24 | Disposition: A | Discharge status: Home / Self Care | Payer: Self-pay | Source: Ambulatory Visit | Attending: Neurology | Admitting: Neurology

## 2014-09-24 ENCOUNTER — Ambulatory Visit
Admission: RE | Admit: 2014-09-24 | Discharge: 2014-09-24 | Disposition: A | Discharge status: Home / Self Care | Payer: PRIVATE HEALTH INSURANCE | Source: Ambulatory Visit | Attending: Neurology | Admitting: Neurology

## 2014-09-24 DIAGNOSIS — H9312 Tinnitus, left ear: Secondary | ICD-10-CM

## 2014-09-24 DIAGNOSIS — G453 Amaurosis fugax: Secondary | ICD-10-CM

## 2014-09-24 DIAGNOSIS — G44229 Chronic tension-type headache, not intractable: Secondary | ICD-10-CM

## 2014-09-24 DIAGNOSIS — R42 Dizziness and giddiness: Secondary | ICD-10-CM

## 2014-09-24 MED ORDER — GADOBENATE DIMEGLUMINE 529 MG/ML IV SOLN
8.0000 mL | Freq: Once | INTRAVENOUS | Status: AC | PRN
  Administered 2014-09-24: 8 mL via INTRAVENOUS

## 2014-09-29 ENCOUNTER — Telehealth: Payer: Self-pay | Admitting: Neurology

## 2014-09-29 NOTE — Telephone Encounter (Signed)
Patient's wife is calling to get MRI results.Please call and advise.

## 2014-09-30 NOTE — Telephone Encounter (Signed)
Called and spoke to patient and his wife. He was seen for transient monocular vision loss. Patient had a repeat episode of some blurry vision in the same eye but no vision loss like the first episode. If it happens again patient is to call me and will consider changing aspirin to plavix. Will hold on aspirin at current time.

## 2015-07-16 NOTE — Telephone Encounter (Signed)
Error

## 2015-10-15 DIAGNOSIS — Z794 Long term (current) use of insulin: Secondary | ICD-10-CM | POA: Diagnosis not present

## 2015-10-15 DIAGNOSIS — R05 Cough: Secondary | ICD-10-CM | POA: Diagnosis not present

## 2015-10-15 DIAGNOSIS — E7800 Pure hypercholesterolemia, unspecified: Secondary | ICD-10-CM | POA: Diagnosis not present

## 2015-10-15 DIAGNOSIS — E1121 Type 2 diabetes mellitus with diabetic nephropathy: Secondary | ICD-10-CM | POA: Diagnosis not present

## 2015-10-15 DIAGNOSIS — I10 Essential (primary) hypertension: Secondary | ICD-10-CM | POA: Diagnosis not present

## 2015-10-15 DIAGNOSIS — J3089 Other allergic rhinitis: Secondary | ICD-10-CM | POA: Diagnosis not present

## 2015-10-15 DIAGNOSIS — Z1211 Encounter for screening for malignant neoplasm of colon: Secondary | ICD-10-CM | POA: Diagnosis not present

## 2015-10-15 DIAGNOSIS — K219 Gastro-esophageal reflux disease without esophagitis: Secondary | ICD-10-CM | POA: Diagnosis not present

## 2015-10-15 DIAGNOSIS — Z23 Encounter for immunization: Secondary | ICD-10-CM | POA: Diagnosis not present

## 2015-10-21 HISTORY — PX: COLONOSCOPY: SHX174

## 2015-11-03 DIAGNOSIS — K219 Gastro-esophageal reflux disease without esophagitis: Secondary | ICD-10-CM | POA: Diagnosis not present

## 2015-11-03 DIAGNOSIS — Z1211 Encounter for screening for malignant neoplasm of colon: Secondary | ICD-10-CM | POA: Diagnosis not present

## 2015-11-03 DIAGNOSIS — R112 Nausea with vomiting, unspecified: Secondary | ICD-10-CM | POA: Diagnosis not present

## 2015-11-11 DIAGNOSIS — K317 Polyp of stomach and duodenum: Secondary | ICD-10-CM | POA: Diagnosis not present

## 2015-11-11 DIAGNOSIS — K219 Gastro-esophageal reflux disease without esophagitis: Secondary | ICD-10-CM | POA: Diagnosis not present

## 2015-11-11 DIAGNOSIS — K649 Unspecified hemorrhoids: Secondary | ICD-10-CM | POA: Diagnosis not present

## 2015-11-11 DIAGNOSIS — Z1211 Encounter for screening for malignant neoplasm of colon: Secondary | ICD-10-CM | POA: Diagnosis not present

## 2015-11-11 DIAGNOSIS — Z8371 Family history of colonic polyps: Secondary | ICD-10-CM | POA: Diagnosis not present

## 2015-11-13 ENCOUNTER — Encounter (HOSPITAL_BASED_OUTPATIENT_CLINIC_OR_DEPARTMENT_OTHER): Payer: Self-pay | Admitting: *Deleted

## 2015-11-13 ENCOUNTER — Emergency Department (HOSPITAL_BASED_OUTPATIENT_CLINIC_OR_DEPARTMENT_OTHER)
Admission: EM | Admit: 2015-11-13 | Discharge: 2015-11-13 | Disposition: A | Discharge status: Home / Self Care | Payer: PPO | Attending: Emergency Medicine | Admitting: Emergency Medicine

## 2015-11-13 DIAGNOSIS — Z87891 Personal history of nicotine dependence: Secondary | ICD-10-CM | POA: Insufficient documentation

## 2015-11-13 DIAGNOSIS — Y998 Other external cause status: Secondary | ICD-10-CM | POA: Insufficient documentation

## 2015-11-13 DIAGNOSIS — N189 Chronic kidney disease, unspecified: Secondary | ICD-10-CM | POA: Insufficient documentation

## 2015-11-13 DIAGNOSIS — Y9289 Other specified places as the place of occurrence of the external cause: Secondary | ICD-10-CM | POA: Diagnosis not present

## 2015-11-13 DIAGNOSIS — S199XXA Unspecified injury of neck, initial encounter: Secondary | ICD-10-CM | POA: Diagnosis present

## 2015-11-13 DIAGNOSIS — I129 Hypertensive chronic kidney disease with stage 1 through stage 4 chronic kidney disease, or unspecified chronic kidney disease: Secondary | ICD-10-CM | POA: Diagnosis not present

## 2015-11-13 DIAGNOSIS — Z87442 Personal history of urinary calculi: Secondary | ICD-10-CM | POA: Diagnosis not present

## 2015-11-13 DIAGNOSIS — E785 Hyperlipidemia, unspecified: Secondary | ICD-10-CM | POA: Diagnosis not present

## 2015-11-13 DIAGNOSIS — Z79899 Other long term (current) drug therapy: Secondary | ICD-10-CM | POA: Insufficient documentation

## 2015-11-13 DIAGNOSIS — Y9389 Activity, other specified: Secondary | ICD-10-CM | POA: Diagnosis not present

## 2015-11-13 DIAGNOSIS — E119 Type 2 diabetes mellitus without complications: Secondary | ICD-10-CM | POA: Insufficient documentation

## 2015-11-13 DIAGNOSIS — Z7982 Long term (current) use of aspirin: Secondary | ICD-10-CM | POA: Diagnosis not present

## 2015-11-13 DIAGNOSIS — S161XXA Strain of muscle, fascia and tendon at neck level, initial encounter: Secondary | ICD-10-CM | POA: Insufficient documentation

## 2015-11-13 DIAGNOSIS — X58XXXA Exposure to other specified factors, initial encounter: Secondary | ICD-10-CM | POA: Insufficient documentation

## 2015-11-13 MED ORDER — IBUPROFEN 400 MG PO TABS: 400.0000 mg | ORAL_TABLET | Freq: Three times a day (TID) | ORAL | Status: AC

## 2015-11-13 MED ORDER — DIAZEPAM 5 MG PO TABS: 5.0000 mg | ORAL_TABLET | Freq: Four times a day (QID) | ORAL | Status: DC | PRN

## 2015-11-13 NOTE — ED Notes (Signed)
MD at bedside. 

## 2015-11-13 NOTE — ED Notes (Signed)
Pt states he had an endoscopy and colonoscopy Wed and the neck discomfort started Wed afternoon. Denies numbness/tingling at present.

## 2015-11-13 NOTE — ED Notes (Signed)
Pt reports left side of neck stiff after endoscopy on Wed. Painful to turn head to side or tilt backwards

## 2015-11-13 NOTE — ED Provider Notes (Signed)
CSN: [id number]     Arrival date & time 11/13/15  1705 History  By signing my name below, I, Alex Mccarty, attest that this documentation has been prepared under the direction and in the presence of Merrily Pew, MD. Electronically Signed: Julien Nordmann, ED Scribe. 11/13/2015. 6:39 PM.    Chief Complaint  Patient presents with  . Neck Pain      The history is provided by the patient. No language interpreter was used.   HPI Comments: Alex Mccarty is a 70 y.o. male who has a hx of HTN, hyperlipidemia, DM and CKD presents to the Emergency Department complaining of constant, gradual worsening, moderate, sharp, left sided neck pain with associated stiffness onset two days ago.Pt had an upper endoscopy two days ago with no complications. He says before the procedure, they extended his head backwards that could have caused the pain. Pt reports having pain after the procedure and notes the same night he began to feel an electric shock feeling in his neck and states the first three fingers in his left hand were numb. He has used heat, ice, and took some flexeril that he was prescribed to alleviate the pain with no relief. Pt endorses felt an electric shock in his neck once that radiated down his arm however every other time it is in the same spot with certain movements that causes increased pain and does not radiate. Denies back pain and fever.  Past Medical History  Diagnosis Date  . Kidney stones   . Hypertension   . Hyperlipidemia   . Diabetes mellitus ([place])   . CKD (chronic kidney disease)     unkknown stage per patient   Past Surgical History  Procedure Laterality Date  . Foot surgery      crushed heel  . Elbow surgery      ulnar nerve repair   Family History  Problem Relation Age of Onset  . Kidney Stones Mother   . Heart disease Mother    Social History  Substance Use Topics  . Smoking status: Former Research scientist (life sciences)  . Smokeless tobacco: Never Used  . Alcohol Use: 0.0 oz/week    0  Standard drinks or equivalent per week     Comment: occasional    Review of Systems  Constitutional: Negative for fever.  Musculoskeletal: Positive for neck pain and neck stiffness. Negative for back pain.  All other systems reviewed and are negative.     Allergies  Codeine; Penicillins; and Sulfa antibiotics  Home Medications   Prior to Admission medications   Medication Sig Start Date End Date Taking? Authorizing Provider  ASPIRIN LOW DOSE 81 MG EC tablet  08/28/14  Yes Historical Provider, MD  atorvastatin (LIPITOR) 80 MG tablet  02/23/14  Yes Historical Provider, MD  cetirizine (ZYRTEC) 10 MG tablet Take 10 mg by mouth daily.   Yes Historical Provider, MD  glucosamine-chondroitin (GLUCOSAMINE-CHONDROITIN DS) 500-400 MG tablet Take by mouth.   Yes Historical Provider, MD  lansoprazole (PREVACID) 30 MG capsule Take 30 mg by mouth daily.   Yes Historical Provider, MD  LANTUS SOLOSTAR 100 UNIT/ML Solostar Pen  06/02/14  Yes Historical Provider, MD  losartan (COZAAR) 100 MG tablet  08/08/14  Yes Historical Provider, MD  potassium citrate (UROCIT-K) 10 MEQ (1080 MG) SR tablet Take 10 mEq by mouth 3 (three) times daily with meals.   Yes Historical Provider, MD  Vitamin D, Ergocalciferol, (DRISDOL) 50000 UNITS CAPS capsule  08/28/14  Yes Historical Provider, MD  Atorvastatin Calcium (  LIPITOR PO) Take by mouth.    Historical Provider, MD  diazepam (VALIUM) 5 MG tablet Take 1 tablet (5 mg total) by mouth every 6 (six) hours as needed (spasms). 11/13/15   Merrily Pew, MD  glucosamine-chondroitin 500-400 MG tablet Take 1 tablet by mouth 3 (three) times daily.    Historical Provider, MD  ibuprofen (ADVIL,MOTRIN) 400 MG tablet Take 1 tablet (400 mg total) by mouth 3 (three) times daily. 11/13/15 11/17/15  Merrily Pew, MD  meclizine (ANTIVERT) 25 MG tablet Take 1 tablet (25 mg total) by mouth 3 (three) times daily as needed for dizziness. 09/09/14   Melvenia Beam, MD  PRESCRIPTION MEDICATION Rx  medication for hypertension    Historical Provider, MD   Triage vitals: BP 179/84 mmHg  Pulse 86  Temp(Src) 98.6 F (37 C) (Oral)  Resp 16  Ht 5\' 9"  (1.753 m)  Wt 190 lb (86.183 kg)  BMI 28.05 kg/m2  SpO2 98% Physical Exam  Constitutional: He is oriented to person, place, and time. He appears well-developed and well-nourished.  HENT:  Head: Normocephalic.  Eyes: EOM are normal.  Neck: Normal range of motion.  No crepitus, trachea midline, no anterior neck pain, TTP at sternocleidomastoid where attaches to left side of neck  Pulmonary/Chest: Effort normal.  Abdominal: He exhibits no distension.  Musculoskeletal: Normal range of motion.  Distal pulses intact  Neurological: He is alert and oriented to person, place, and time. No cranial nerve deficit.  Cranial nerves intact, good grip and sensation  Psychiatric: He has a normal mood and affect.  Nursing note and vitals reviewed.   ED Course  Procedures  DIAGNOSTIC STUDIES: Oxygen Saturation is 98% on RA, normal by my interpretation.  COORDINATION OF CARE:  6:35 PM Discussed treatment plan which includes valium and apply heat the area with pt at bedside and pt agreed to plan.  Labs Review Labs Reviewed - No data to display  Imaging Review No results found. I have personally reviewed and evaluated these images and lab results as part of my medical decision-making.   EKG Interpretation None      MDM   Final diagnoses:  Neck strain, initial encounter    70 year old male here with left-sided likely muscle skeletal strain after a endoscopy. Had one episode where he had shooting pain down his arm however none since then. Doubt that the patient has any nerve damage or fractures. No crepitus to suggest any kind of esophageal injury. We'll treat as muscle spasm and have him follow with primary doctor in 3-4 days. Or return here for any new or worsening symptoms.  As an aside the patient had tremors and a pill-rolling tremor  at rest suspicious for possible Parkinson's disease I brought this up with the family and they will follow up with her primary doctor as he has a family history of the same.  I personally performed the services described in this documentation, which was scribed in my presence. The recorded information has been reviewed and is accurate.     Merrily Pew, MD 11/13/15 [phone]

## 2015-11-16 DIAGNOSIS — M542 Cervicalgia: Secondary | ICD-10-CM | POA: Diagnosis not present

## 2015-11-16 DIAGNOSIS — M62838 Other muscle spasm: Secondary | ICD-10-CM | POA: Diagnosis not present

## 2015-11-18 DIAGNOSIS — Z1159 Encounter for screening for other viral diseases: Secondary | ICD-10-CM | POA: Diagnosis not present

## 2015-11-18 DIAGNOSIS — Z794 Long term (current) use of insulin: Secondary | ICD-10-CM | POA: Diagnosis not present

## 2015-11-18 DIAGNOSIS — R251 Tremor, unspecified: Secondary | ICD-10-CM | POA: Diagnosis not present

## 2015-11-18 DIAGNOSIS — E1121 Type 2 diabetes mellitus with diabetic nephropathy: Secondary | ICD-10-CM | POA: Diagnosis not present

## 2015-11-26 DIAGNOSIS — R251 Tremor, unspecified: Secondary | ICD-10-CM | POA: Diagnosis not present

## 2015-11-26 DIAGNOSIS — M542 Cervicalgia: Secondary | ICD-10-CM | POA: Diagnosis not present

## 2015-11-26 DIAGNOSIS — G250 Essential tremor: Secondary | ICD-10-CM | POA: Diagnosis not present

## 2015-12-03 DIAGNOSIS — M542 Cervicalgia: Secondary | ICD-10-CM | POA: Diagnosis not present

## 2015-12-10 ENCOUNTER — Ambulatory Visit (INDEPENDENT_AMBULATORY_CARE_PROVIDER_SITE_OTHER): Payer: PRIVATE HEALTH INSURANCE | Admitting: Rehabilitative and Restorative Service Providers"

## 2015-12-10 DIAGNOSIS — M542 Cervicalgia: Secondary | ICD-10-CM

## 2015-12-10 DIAGNOSIS — Z7409 Other reduced mobility: Secondary | ICD-10-CM

## 2015-12-10 DIAGNOSIS — M797 Fibromyalgia: Secondary | ICD-10-CM | POA: Diagnosis not present

## 2015-12-10 DIAGNOSIS — M436 Torticollis: Secondary | ICD-10-CM

## 2015-12-10 DIAGNOSIS — M7918 Myalgia, other site: Secondary | ICD-10-CM

## 2015-12-10 NOTE — Patient Instructions (Signed)
Suboccipital Stretch (Supine)    With small towel roll at base of skull and upper neck. Gently tuck chin until stretch is felt at base of skull and upper neck. Hold __10__ seconds. Relax. Repeat _10___ times per set.  Do __several __ sessions per day.  Axial Extension (Chin Tuck)    Pull chin in and lengthen back of neck. Hold __10__ seconds while counting out loud. Repeat __10__ times. Do __several__ sessions per day.   Shoulder Blade Squeeze    Rotate shoulders back, then squeeze shoulder blades down and back. Repeat __10__ times. Do __several__ sessions per day.         TENS UNIT: This is helpful for muscle pain and spasm.   Search and Purchase a TENS 7000 2nd edition at www.tenspros.com. It should be less than $30.     TENS unit instructions: Do not shower or bathe with the unit on Turn the unit off before removing electrodes or batteries If the electrodes lose stickiness add a drop of water to the electrodes after they are disconnected from the unit and place on plastic sheet. If you continued to have difficulty, call the TENS unit company to purchase more electrodes. Do not apply lotion on the skin area prior to use. Make sure the skin is clean and dry as this will help prolong the life of the electrodes. After use, always check skin for unusual red areas, rash or other skin difficulties. If there are any skin problems, does not apply electrodes to the same area. Never remove the electrodes from the unit by pulling the wires. Do not use the TENS unit or electrodes other than as directed. Do not change electrode placement without consultating your therapist or physician. Keep 2 fingers with between each electrode.
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
## 2015-12-16 ENCOUNTER — Ambulatory Visit (INDEPENDENT_AMBULATORY_CARE_PROVIDER_SITE_OTHER): Payer: PRIVATE HEALTH INSURANCE | Admitting: Rehabilitative and Restorative Service Providers"

## 2015-12-16 ENCOUNTER — Encounter: Payer: Self-pay | Admitting: Rehabilitative and Restorative Service Providers"

## 2015-12-16 DIAGNOSIS — M542 Cervicalgia: Secondary | ICD-10-CM | POA: Diagnosis not present

## 2015-12-16 DIAGNOSIS — M7918 Myalgia, other site: Secondary | ICD-10-CM

## 2015-12-16 DIAGNOSIS — Z7409 Other reduced mobility: Secondary | ICD-10-CM

## 2015-12-16 DIAGNOSIS — M436 Torticollis: Secondary | ICD-10-CM

## 2015-12-16 DIAGNOSIS — M797 Fibromyalgia: Secondary | ICD-10-CM

## 2015-12-16 NOTE — Therapy (Signed)
[place], [place], [address] Phone: [phone]   Fax:  [phone]  Physical Therapy Treatment  Patient Details  Name: Alex Mccarty MRN: [id number] Date of Birth: 06-19-1946 Referring Provider: Dr. Thom Chimes   Encounter Date: 12/16/2015    Past Medical History  Diagnosis Date  . Kidney stones   . Hypertension   . Hyperlipidemia   . Diabetes mellitus ([place])   . CKD (chronic kidney disease)     unkknown stage per patient    Past Surgical History  Procedure Laterality Date  . Foot surgery      crushed heel  . Elbow surgery      ulnar nerve repair    There were no vitals filed for this visit.  Visit Diagnosis:  Cervicalgia  Stiffness of neck  Myofacial muscle pain  Impaired functional mobility and endurance      Subjective Assessment - 12/16/15 0812    Subjective Awakens each morning with numbness in the Lt little/ring/long fingers which resolves with shower and movement. He is no longer taking the muscle relaxant and pain meds and feels that he is improving.    Currently in Pain? No/denies   Pain Location Neck   Pain Orientation Left   Pain Descriptors / Indicators Tightness   Pain Type Acute pain   Pain Onset 1 to 4 weeks ago   Pain Frequency Constant                         [facility] Adult PT Treatment/Exercise - 12/16/15 0001    Neck Exercises: Standing   Neck Retraction 5 reps;10 secs   Neck Exercises: Supine   Neck Retraction 5 reps;10 secs   Shoulder Exercises: Standing   Extension Strengthening;Both;10 reps;Theraband   Theraband Level (Shoulder Extension) Level 2 (Red)   Row Strengthening;Both;10 reps;Theraband   Theraband Level (Shoulder Row) Level 2 (Red)   Retraction Strengthening;Both;10 reps;Theraband   Theraband Level (Shoulder Retraction) Level 2 (Red)   Other Standing Exercises scap squeeze with noodle 10 sec x 10    Shoulder  Exercises: Stretch   Other Shoulder Stretches 3 way doorway 30 sec x 3 each position    Moist Heat Therapy   Number Minutes Moist Heat 15 Minutes   Moist Heat Location Cervical   Electrical Stimulation   Electrical Stimulation Location bilat C5; Lt paracervical mm; :t posterior upper trap    Electrical Stimulation Action IFC   Electrical Stimulation Parameters to tolerance   Electrical Stimulation Goals Tone;Pain   Manual Therapy   Manual therapy comments pt supine   Joint Mobilization cervical CPA; UPA Lt > Rt grade II/III especially tight at C5 area    Soft tissue mobilization anterior/lateral/posterior cervical musculature    Myofascial Release pecs/anterior chest    Passive ROM cervical stretching fwd flexion and lateral flexion    Manual Traction cervical 3-4 reps 20-30 sec hold                 PT Education - 12/16/15 0827    Education provided Yes   Education Details HEP    Person(s) Educated Patient   Methods Explanation;Demonstration;Tactile cues;Verbal cues;Handout   Comprehension Verbalized understanding;Returned demonstration;Verbal cues required;Tactile cues required             PT Long Term Goals - 12/16/15 [id number]    PT LONG TERM GOAL #1   Title Improve posture and alignment with pt to demonstrate good  upright posture 01/21/16   Time 6   Period Weeks   Status On-going   PT LONG TERM GOAL #2   Title Improve cervical ROM with pt to demonstrate painfree ROM WNL's throughout 01/21/16   Time 6   Period Weeks   Status On-going   PT LONG TERM GOAL #3   Title Patient demonstrates painfree functional ROM to check traffic when stopped at a stop sign 01/21/16   Time 6   Period Weeks   Status On-going   PT LONG TERM GOAL #4   Title Improved soft tissue extensibility and spinal mobility through cervical spine to palpation 01/21/16   Time 6   Period Weeks   Status On-going   PT LONG TERM GOAL #5   Title I in HEP 01/21/16   Time 6   Period Weeks   Status On-going    PT LONG TERM GOAL #6   Title Improve FOTO to </= to 67% limitation 01/21/16   Time 6   Period Weeks   Status On-going               Plan - 12/16/15 0801    Clinical Impression Statement Patient reports that he awakens every morning with numbness and tingling in the Lt fingers which resolves with movement and hot shower by 10 am. Corrected exercise technique. Progressing well toward stated goals of therapy.    Pt will benefit from skilled therapeutic intervention in order to improve on the following deficits Postural dysfunction;Improper body mechanics;Decreased range of motion;Decreased mobility;Increased fascial restricitons;Increased muscle spasms;Decreased endurance;Decreased activity tolerance;Pain   Rehab Potential Good   PT Frequency 2x / week   PT Duration 6 weeks   PT Treatment/Interventions Patient/family education;ADLs/Self Care Home Management;Therapeutic exercise;Therapeutic activities;Manual techniques;Dry needling;Neuromuscular re-education;Cryotherapy;Electrical Stimulation;Iontophoresis 4mg /ml Dexamethasone;Moist Heat;Ultrasound;Traction   PT Next Visit Plan Progress with pec stretching; work on posture and alignment; posterior shoulder girdle strengthening; manual work through cervical spine; TDN as indicated; manual cervical traction and stretching   PT Home Exercise Plan postural correction; HEP    Consulted and Agree with Plan of Care Patient        Problem List Patient Active Problem List   Diagnosis Date Noted  . Vision, loss, sudden 09/23/2014  . Amaurosis fugax of right eye 09/09/2014  . Benign paroxysmal positional vertigo 09/09/2014  . Headache 09/09/2014  . Vertigo 09/09/2014    Alex Mccarty Nilda Simmer PT, MPH  12/16/2015, 8:48 AM  [facility] [place], [place], [address] Phone: [phone]   Fax:  [phone]  Name: Alex Mccarty MRN: [id number] Date of Birth:  1946-03-08

## 2015-12-16 NOTE — Patient Instructions (Signed)
Scapula Adduction With Pectoralis Stretch: Low - Standing   Shoulders at 45 hands even with shoulders, keeping weight through legs, shift weight forward until you feel pull or stretch through the front of your chest. Hold _30__ seconds. Do _3__ times, _2-4__ times per day.   Scapula Adduction With Pectoralis Stretch: Mid-Range - Standing   Shoulders at 90 elbows even with shoulders, keeping weight through legs, shift weight forward until you feel pull or strength through the front of your chest. Hold __30_ seconds. Do _3__ times, __2-4_ times per day.   Scapula Adduction With Pectoralis Stretch: High - Standing   Shoulders at 120 hands up high on the doorway, keeping weight on feet, shift weight forward until you feel pull or stretch through the front of your chest. Hold _30__ seconds. Do _3__ times, _2-3__ times per day.   Resisted External Rotation: in Neutral - Bilateral   PALMS UP Sit or stand, tubing in both hands, elbows at sides, bent to 90, forearms forward. Pinch shoulder blades together and rotate forearms out. Keep elbows at sides. Repeat __10__ times per set. Do _2-3___ sets per session. Do _2-3___ sessions per day.   Low Row: Standing   Face anchor, feet shoulder width apart. Palms up, pull arms back, squeezing shoulder blades together. Repeat 10__ times per set. Do 2-3__ sets per session. Do 2-3__ sessions per week. Anchor Height: Waist   Strengthening: Resisted Extension   Hold tubing in right hand, arm forward. Pull arm back, elbow straight. Repeat _10___ times per set. Do 2-3____ sets per session. Do 2-3____ sessions per day.

## 2015-12-18 ENCOUNTER — Encounter: Payer: Self-pay | Admitting: Physical Therapy

## 2015-12-18 ENCOUNTER — Ambulatory Visit (INDEPENDENT_AMBULATORY_CARE_PROVIDER_SITE_OTHER): Payer: PRIVATE HEALTH INSURANCE | Admitting: Physical Therapy

## 2015-12-18 DIAGNOSIS — Z7409 Other reduced mobility: Secondary | ICD-10-CM | POA: Diagnosis not present

## 2015-12-18 DIAGNOSIS — M7918 Myalgia, other site: Secondary | ICD-10-CM

## 2015-12-18 DIAGNOSIS — M436 Torticollis: Secondary | ICD-10-CM | POA: Diagnosis not present

## 2015-12-18 DIAGNOSIS — M797 Fibromyalgia: Secondary | ICD-10-CM

## 2015-12-18 NOTE — Therapy (Signed)
[place], [place], [address] Phone: [phone]   Fax:  [phone]  Physical Therapy Treatment  Patient Details  Name: Alex Mccarty MRN: [id number] Date of Birth: December 29, 1945 Referring Provider: Dr. Thom Chimes   Encounter Date: 12/18/2015      PT End of Session - 12/18/15 1521    Visit Number 3   Number of Visits 12   Date for PT Re-Evaluation 01/21/16   PT Start Time [id number]   PT Stop Time 1624   PT Time Calculation (min) 63 min      Past Medical History  Diagnosis Date  . Kidney stones   . Hypertension   . Hyperlipidemia   . Diabetes mellitus ([place])   . CKD (chronic kidney disease)     unkknown stage per patient    Past Surgical History  Procedure Laterality Date  . Foot surgery      crushed heel  . Elbow surgery      ulnar nerve repair    There were no vitals filed for this visit.  Visit Diagnosis:  Stiffness of neck  Myofacial muscle pain  Impaired functional mobility and endurance      Subjective Assessment - 12/18/15 1522    Subjective Pt states his shoulders and all felt better after the last treatment, he wasn't able to do his HEP as he had his 68 yo granddaughter. Still has trouble turning his head to the left with driving   Currently in Pain? Yes   Pain Score 2   up to 6/10 with turning and tilting Lt    Pain Location --  toothache    Pain Orientation Left   Pain Descriptors / Indicators Aching   Pain Type Acute pain            [facility] PT Assessment - 12/18/15 0001    Assessment   Medical Diagnosis Cervicalgia   AROM   Cervical - Right Rotation 69   Cervical - Left Rotation 55                     [facility] Adult PT Treatment/Exercise - 12/18/15 0001    Neck Exercises: Machines for Strengthening   UBE (Upper Arm Bike) L2x4' alt FWD/BWD   Neck Exercises: Standing   Other Standing Exercises shoulder shrugs bilat, 3# x15 reps   Neck  Exercises: Seated   Other Seated Exercise cerical sidebend stretchingx 20 sec bilat.    Neck Exercises: Prone   Other Prone Exercise POE,cervical diagonals each side x 10    Modalities   Modalities Electrical Stimulation;Moist Heat   Moist Heat Therapy   Number Minutes Moist Heat 15 Minutes   Moist Heat Location Cervical   Electrical Stimulation   Electrical Stimulation Location bilat C5; Lt paracervical mm; :t posterior upper trap    Electrical Stimulation Action IFC   Electrical Stimulation Parameters  to tolerance   Electrical Stimulation Goals Tone;Pain   Manual Therapy   Manual Therapy Joint mobilization   Manual therapy comments Pt prone   Joint Mobilization c-spine,   grade III CPA, UPA with focus on Lt C6-7   Soft tissue mobilization cervical paraspinals   Passive ROM in supine, all directions.    Manual Traction cervical 3-4 reps 20-30 sec hold           Trigger Point Dry Needling - 12/18/15 1611    Consent Given? Yes   Education Handout Provided No   Muscles  Treated Upper Body Longissimus  Lt cervical C4-7   Longissimus Response Palpable increased muscle length;Twitch response elicited                   PT Long Term Goals - 12/16/15 0832    PT LONG TERM GOAL #1   Title Improve posture and alignment with pt to demonstrate good upright posture 01/21/16   Time 6   Period Weeks   Status On-going   PT LONG TERM GOAL #2   Title Improve cervical ROM with pt to demonstrate painfree ROM WNL's throughout 01/21/16   Time 6   Period Weeks   Status On-going   PT LONG TERM GOAL #3   Title Patient demonstrates painfree functional ROM to check traffic when stopped at a stop sign 01/21/16   Time 6   Period Weeks   Status On-going   PT LONG TERM GOAL #4   Title Improved soft tissue extensibility and spinal mobility through cervical spine to palpation 01/21/16   Time 6   Period Weeks   Status On-going   PT LONG TERM GOAL #5   Title I in HEP 01/21/16   Time 6    Period Weeks   Status On-going   PT LONG TERM GOAL #6   Title Improve FOTO to </= to 67% limitation 01/21/16   Time 6   Period Weeks   Status On-going               Plan - 12/18/15 1612    Clinical Impression Statement Alex Mccarty had some good twitch responses to TDN in the Lt cervical paraspinals. He had slight increase in cervical motion after treatment.         Problem List Patient Active Problem List   Diagnosis Date Noted  . Vision, loss, sudden 09/23/2014  . Amaurosis fugax of right eye 09/09/2014  . Benign paroxysmal positional vertigo 09/09/2014  . Headache 09/09/2014  . Vertigo 09/09/2014    Jeral Pinch PT  12/18/2015, [place], [place], [address] Phone: [phone]   Fax:  [phone]  Name: Alex Mccarty MRN: [id number] Date of Birth: 1946/02/01

## 2015-12-18 NOTE — Patient Instructions (Signed)

## 2015-12-22 ENCOUNTER — Encounter: Payer: Self-pay | Admitting: Rehabilitative and Restorative Service Providers"

## 2015-12-22 ENCOUNTER — Ambulatory Visit (INDEPENDENT_AMBULATORY_CARE_PROVIDER_SITE_OTHER): Payer: PRIVATE HEALTH INSURANCE | Admitting: Rehabilitative and Restorative Service Providers"

## 2015-12-22 DIAGNOSIS — Z7409 Other reduced mobility: Secondary | ICD-10-CM | POA: Diagnosis not present

## 2015-12-22 DIAGNOSIS — M542 Cervicalgia: Secondary | ICD-10-CM

## 2015-12-22 DIAGNOSIS — R29898 Other symptoms and signs involving the musculoskeletal system: Secondary | ICD-10-CM

## 2015-12-22 DIAGNOSIS — M797 Fibromyalgia: Secondary | ICD-10-CM

## 2015-12-22 DIAGNOSIS — M791 Myalgia, unspecified site: Secondary | ICD-10-CM

## 2015-12-22 DIAGNOSIS — M436 Torticollis: Secondary | ICD-10-CM | POA: Diagnosis not present

## 2015-12-22 NOTE — Patient Instructions (Signed)
Neurovascular: Median Nerve Stretch - Supine    Lie with neck supported, tuck chin; side-bent away from moving arm. Hold left arm out to side, elbow bent, thumb down, fingers and wrist bent back. Slowly straighten elbow as far as possible without pain. Hold for __60__ seconds. Repeat __2__ times per set. Twice daily.  NO PAIN!

## 2015-12-22 NOTE — Therapy (Addendum)
[place], [place], [address] Phone: [phone]   Fax:  [phone]  Physical Therapy Treatment  Patient Details  Name: Alex Mccarty MRN: [id number] Date of Birth: 1946-08-29 Referring Provider: Dr. Thom Chimes  Encounter Date: 12/22/2015      PT End of Session - 12/22/15 0836    Visit Number 4   Number of Visits 12   Date for PT Re-Evaluation 01/21/16   PT Start Time [id number]   PT Stop Time 0938   PT Time Calculation (min) 56 min      Past Medical History  Diagnosis Date  . Kidney stones   . Hypertension   . Hyperlipidemia   . Diabetes mellitus ([place])   . CKD (chronic kidney disease)     unkknown stage per patient    Past Surgical History  Procedure Laterality Date  . Foot surgery      crushed heel  . Elbow surgery      ulnar nerve repair    There were no vitals filed for this visit.  Visit Diagnosis:  Other symptoms and signs of the musculoskeletal system   Myoalgia   Cervicalgia      Subjective Assessment - 12/22/15 0840    Subjective Patient reports good response to TDN for about a day and a half. He worked in the yard the following morning and noticed increased soreness afterward. Feels he is generally making progress with neck pain. He continues to have difficulty tilting and turning head to the Lt - feels a lump.    Currently in Pain? No/denies            [facility] PT Assessment - 12/22/15 0001    Assessment   Medical Diagnosis Cervicalgia   Referring Provider Dr. Thom Chimes   Onset Date/Surgical Date 11/02/15   Hand Dominance Right   Next MD Visit 3/17   Prior Therapy one visit at PT not in network    AROM   Cervical - Right Side Bend 36   Cervical - Left Side Bend 31   Cervical - Right Rotation 67   Cervical - Left Rotation 52                     [facility] Adult PT Treatment/Exercise - 12/22/15 0001    Neck Exercises: Machines for  Strengthening   UBE (Upper Arm Bike) L3x4' alt FWD/BWD   Neck Exercises: Standing   Neck Retraction 5 reps;10 secs   Neck Exercises: Seated   Other Seated Exercise cerical sidebend stretching/rotation x 20 sec bilat.    Neck Exercises: Supine   Neck Retraction 5 reps;10 secs   Other Supine Exercise neural tension stretch 1 min x 2 reps assist of PT for safety and teaching    Shoulder Exercises: Stretch   Other Shoulder Stretches 3 way doorway 30 sec x 3 each position    Modalities   Modalities Electrical Stimulation;Moist Heat   Moist Heat Therapy   Number Minutes Moist Heat 15 Minutes   Moist Heat Location Cervical   Electrical Stimulation   Electrical Stimulation Location bilat C5; Lt paracervical mm; :t posterior upper trap    Electrical Stimulation Action IFC   Electrical Stimulation Parameters to tolerance   Electrical Stimulation Goals Tone;Pain   Manual Therapy   Manual Therapy Joint mobilization   Manual therapy comments pt supine   Joint Mobilization c-spine,   grade III CPA, UPA with focus on Lt C5-6/6-7  Soft tissue mobilization cervical paraspinals   Myofascial Release pecs/anterior chest    Passive ROM cervical stretching into flexioin; flexioin with lateral component; scaleni stretches; neural mobilization    Manual Traction cervical 3-4 reps 20-30 sec hold                 PT Education - 12/22/15 0924    Education provided Yes   Education Details HEP; posture and alignment; neural mobilization    Person(s) Educated Patient   Methods Explanation;Demonstration;Tactile cues;Verbal cues;Handout   Comprehension Verbalized understanding;Returned demonstration;Verbal cues required;Tactile cues required             PT Long Term Goals - 12/16/15 0832    PT LONG TERM GOAL #1   Title Improve posture and alignment with pt to demonstrate good upright posture 01/21/16   Time 6   Period Weeks   Status On-going   PT LONG TERM GOAL #2   Title Improve cervical  ROM with pt to demonstrate painfree ROM WNL's throughout 01/21/16   Time 6   Period Weeks   Status On-going   PT LONG TERM GOAL #3   Title Patient demonstrates painfree functional ROM to check traffic when stopped at a stop sign 01/21/16   Time 6   Period Weeks   Status On-going   PT LONG TERM GOAL #4   Title Improved soft tissue extensibility and spinal mobility through cervical spine to palpation 01/21/16   Time 6   Period Weeks   Status On-going   PT LONG TERM GOAL #5   Title I in HEP 01/21/16   Time 6   Period Weeks   Status On-going   PT LONG TERM GOAL #6   Title Improve FOTO to </= to 67% limitation 01/21/16   Time 6   Period Weeks   Status On-going               Plan - 12/22/15 0925    Clinical Impression Statement Good response to TDN. Continued deep tightness in the Lt cervical musculature. Responded well to manual work and passive stretches. Positivie neural tension test. Added neural mobilization with difficulty. Progressing well toward stated goals of treatment.    Pt will benefit from skilled therapeutic intervention in order to improve on the following deficits Postural dysfunction;Improper body mechanics;Decreased range of motion;Decreased mobility;Increased fascial restricitons;Increased muscle spasms;Decreased endurance;Decreased activity tolerance;Pain   Rehab Potential Good   PT Frequency 2x / week   PT Duration 6 weeks   PT Treatment/Interventions Patient/family education;ADLs/Self Care Home Management;Therapeutic exercise;Therapeutic activities;Manual techniques;Dry needling;Neuromuscular re-education;Cryotherapy;Electrical Stimulation;Iontophoresis 4mg /ml Dexamethasone;Moist Heat;Ultrasound;Traction   PT Next Visit Plan Progress with pec stretching; work on posture and alignment; posterior shoulder girdle strengthening; manual work through cervical spine; TDN as indicated; manual cervical traction and stretching. Assess response to manual work and neural  mobilization    PT Home Exercise Plan postural correction; HEP    Consulted and Agree with Plan of Care Patient        Problem List Patient Active Problem List   Diagnosis Date Noted  . Vision, loss, sudden 09/23/2014  . Amaurosis fugax of right eye 09/09/2014  . Benign paroxysmal positional vertigo 09/09/2014  . Headache 09/09/2014  . Vertigo 09/09/2014    Trayquan Kolakowski Nilda Simmer PT, MPH  12/22/2015, 9:28 AM  [facility] [place], [place], [address] Phone: [phone]   Fax:  [phone]  Name: GABOR GAUNA MRN: [id number] Date of Birth: Oct 31, 1945

## 2015-12-24 ENCOUNTER — Ambulatory Visit (INDEPENDENT_AMBULATORY_CARE_PROVIDER_SITE_OTHER): Payer: PRIVATE HEALTH INSURANCE | Admitting: Rehabilitative and Restorative Service Providers"

## 2015-12-24 ENCOUNTER — Encounter: Payer: Self-pay | Admitting: Rehabilitative and Restorative Service Providers"

## 2015-12-24 DIAGNOSIS — M542 Cervicalgia: Secondary | ICD-10-CM | POA: Diagnosis not present

## 2015-12-24 DIAGNOSIS — M797 Fibromyalgia: Secondary | ICD-10-CM | POA: Diagnosis not present

## 2015-12-24 DIAGNOSIS — Z7409 Other reduced mobility: Secondary | ICD-10-CM

## 2015-12-24 DIAGNOSIS — M7918 Myalgia, other site: Secondary | ICD-10-CM

## 2015-12-24 DIAGNOSIS — M436 Torticollis: Secondary | ICD-10-CM

## 2015-12-24 NOTE — Patient Instructions (Addendum)
High Row: Standing    Face anchor, feet shoulder width apart. Palms down, pull arms back, squeezing shoulder blades down and back Repeat _10_ times per set. Do 1-3__ sets per session. Do _1_ sessions per day Anchor Height: Chest     Abdominal Bracing With Pelvic Floor (Hook-Lying)    With neutral spine, tighten pelvic floor and abdominals sucking belly button to back bone; tighten muscles in low back at waist. Hold 10 sec Repeat _10__ times. Do _several__ times a day. Progress to do this in sitting; standing; walking and with functional activities.   Trunk: Prone Extension (Press-Ups)    Lie on stomach on firm, flat surface. Relax bottom and legs. Raise chest in air with elbows straight. Keep hips flat on surface, sag stomach. Hold __2-3__ seconds. Repeat _10___ times. Do _2___ sessions per day. CAUTION: Movement should be gentle and slow.    Shoulder Blade Squeeze: Arms at Sides    Arms at sides, parallel, elbows straight, palms up. Press pelvis down. Squeeze backbone with shoulder blades, raising front of shoulders, chest, and arms. Keep head and neck neutral. Hold _2-3__ seconds. Relax. Repeat __10_ times.   Shoulder Blade Squeeze: Airplane    Arms out to sides at 90, elbows straight, palms down. Press pelvis down. Squeeze backbone with shoulder blades. Raise arms, front of shoulders, chest, and head. Keep neck neutral. Hold _2-3__ seconds. Relax. Repeat _10__ times.    Shoulder Blade Squeeze: W    Arms out to sides at 90 palms down. Bend elbows to 90. Press pelvis down. Squeeze backbone with shoulder blades. Raise arms, front of shoulders, chest, and head. Keep neck neutral. Hold __2-3_ seconds. Relax. Repeat _10__ times.

## 2015-12-24 NOTE — Therapy (Signed)
[place], [place], [address] Phone: [phone]   Fax:  [phone]  Physical Therapy Treatment  Patient Details  Name: Alex Mccarty MRN: [id number] Date of Birth: 1946-02-11 Referring Provider: Dr. Thom Chimes  Encounter Date: 12/24/2015      PT End of Session - 12/24/15 0809    Visit Number 5   Number of Visits 12   Date for PT Re-Evaluation 01/21/16   PT Start Time 0806   PT Stop Time 0901   PT Time Calculation (min) 55 min   Activity Tolerance Patient tolerated treatment well      Past Medical History  Diagnosis Date  . Kidney stones   . Hypertension   . Hyperlipidemia   . Diabetes mellitus ([place])   . CKD (chronic kidney disease)     unkknown stage per patient    Past Surgical History  Procedure Laterality Date  . Foot surgery      crushed heel  . Elbow surgery      ulnar nerve repair    There were no vitals filed for this visit.  Visit Diagnosis:  Stiffness of neck  Myofacial muscle pain  Impaired functional mobility and endurance  Cervicalgia      Subjective Assessment - 12/24/15 0810    Subjective Patient reports that he was sore following last treatment but did fine with no increase in pain or stiffness. He has morning stiffness but that resolves after he starts moving. He continues to have dic=fficulty turning head to the Lt.    Currently in Pain? No/denies                         [facility] Adult PT Treatment/Exercise - 12/24/15 0001    Neck Exercises: Machines for Strengthening   UBE (Upper Arm Bike) L3x4' alt FWD/BWD   Neck Exercises: Standing   Neck Retraction 5 reps;10 secs   Neck Exercises: Supine   Neck Retraction 5 reps;10 secs   Other Supine Exercise neural tension stretch 1 min x 2 reps   Other Supine Exercise supine on swim noodle arms at X 90 deg x 4 min    Neck Exercises: Prone   Other Prone Exercise prone airplane and goal  posts x 10 with scap squeeze    Shoulder Exercises: Standing   Extension Strengthening;Both;10 reps;Theraband   Theraband Level (Shoulder Extension) Level 2 (Red)   Row Strengthening;Both;10 reps;Theraband   Theraband Level (Shoulder Row) Level 2 (Red)   Retraction Strengthening;Both;10 reps;Theraband   Theraband Level (Shoulder Retraction) Level 2 (Red)   Other Standing Exercises scap squeeze with noodle 10 sec x 10    Shoulder Exercises: Stretch   Other Shoulder Stretches 3 way doorway 30 sec x 3 each position    Modalities   Modalities Electrical Stimulation;Moist Heat   Moist Heat Therapy   Number Minutes Moist Heat 15 Minutes   Moist Heat Location Cervical   Electrical Stimulation   Electrical Stimulation Location bilat C5; Lt paracervical mm; :t posterior upper trap    Electrical Stimulation Action IFC   Electrical Stimulation Parameters to tolerance   Electrical Stimulation Goals Tone;Pain   Manual Therapy   Manual Therapy Joint mobilization;Soft tissue mobilization;Myofascial release;Passive ROM;Manual Traction   Manual therapy comments pt supine   Joint Mobilization c-spine,   grade III CPA, UPA with focus on Lt C5-6/6-7   Soft tissue mobilization cervical paraspinals   Myofascial Release pecs/anterior chest  Passive ROM cervical stretching into flexioin; flexioin with lateral component; scaleni stretches; neural mobilization    Manual Traction cervical 3-4 reps 20-30 sec hold                 PT Education - 12/24/15 0826    Education provided Yes   Education Details HEP    Person(s) Educated Patient   Methods Explanation;Demonstration;Tactile cues;Verbal cues;Handout   Comprehension Verbalized understanding;Returned demonstration;Verbal cues required;Tactile cues required             PT Long Term Goals - 12/16/15 0832    PT LONG TERM GOAL #1   Title Improve posture and alignment with pt to demonstrate good upright posture 01/21/16   Time 6   Period  Weeks   Status On-going   PT LONG TERM GOAL #2   Title Improve cervical ROM with pt to demonstrate painfree ROM WNL's throughout 01/21/16   Time 6   Period Weeks   Status On-going   PT LONG TERM GOAL #3   Title Patient demonstrates painfree functional ROM to check traffic when stopped at a stop sign 01/21/16   Time 6   Period Weeks   Status On-going   PT LONG TERM GOAL #4   Title Improved soft tissue extensibility and spinal mobility through cervical spine to palpation 01/21/16   Time 6   Period Weeks   Status On-going   PT LONG TERM GOAL #5   Title I in HEP 01/21/16   Time 6   Period Weeks   Status On-going   PT LONG TERM GOAL #6   Title Improve FOTO to </= to 67% limitation 01/21/16   Time 6   Period Weeks   Status On-going               Plan - 12/24/15 [id number]    Clinical Impression Statement Progressing well with therapy and HEP. Gains in cervical mobility and ease of movement noted. Patient has less palpable tightness through cervical and thoracic spine. Progressing well toward stated goals of therapy.    Pt will benefit from skilled therapeutic intervention in order to improve on the following deficits Postural dysfunction;Improper body mechanics;Decreased range of motion;Decreased mobility;Increased fascial restricitons;Increased muscle spasms;Decreased endurance;Decreased activity tolerance;Pain   Rehab Potential Good   PT Frequency 2x / week   PT Duration 6 weeks   PT Treatment/Interventions Patient/family education;ADLs/Self Care Home Management;Therapeutic exercise;Therapeutic activities;Manual techniques;Dry needling;Neuromuscular re-education;Cryotherapy;Electrical Stimulation;Iontophoresis 4mg /ml Dexamethasone;Moist Heat;Ultrasound;Traction   PT Next Visit Plan Progress with pec stretching; work on posture and alignment; posterior shoulder girdle strengthening; manual work through cervical spine; TDN as indicated; manual cervical traction and stretching. Assess response  to manual work and neural mobilization    PT Home Exercise Plan postural correction; HEP    Consulted and Agree with Plan of Care Patient        Problem List Patient Active Problem List   Diagnosis Date Noted  . Vision, loss, sudden 09/23/2014  . Amaurosis fugax of right eye 09/09/2014  . Benign paroxysmal positional vertigo 09/09/2014  . Headache 09/09/2014  . Vertigo 09/09/2014    Jaran Sainz Nilda Simmer PT, MPH 12/24/2015, 9:13 AM  [facility] [place], [place], [address] Phone: [phone]   Fax:  [phone]  Name: Alex Mccarty MRN: [id number] Date of Birth: [birth year]

## 2015-12-25 NOTE — Addendum Note (Signed)
Addended by: Everardo All on: 12/25/2015 01:03 PM   Modules accepted: Orders

## 2015-12-28 ENCOUNTER — Encounter: Payer: Self-pay | Admitting: Rehabilitative and Restorative Service Providers"

## 2015-12-28 ENCOUNTER — Ambulatory Visit (INDEPENDENT_AMBULATORY_CARE_PROVIDER_SITE_OTHER): Payer: PRIVATE HEALTH INSURANCE | Admitting: Rehabilitative and Restorative Service Providers"

## 2015-12-28 DIAGNOSIS — M797 Fibromyalgia: Secondary | ICD-10-CM

## 2015-12-28 DIAGNOSIS — M791 Myalgia, unspecified site: Secondary | ICD-10-CM

## 2015-12-28 DIAGNOSIS — R29898 Other symptoms and signs involving the musculoskeletal system: Secondary | ICD-10-CM

## 2015-12-28 DIAGNOSIS — M436 Torticollis: Secondary | ICD-10-CM

## 2015-12-28 DIAGNOSIS — Z7409 Other reduced mobility: Secondary | ICD-10-CM | POA: Diagnosis not present

## 2015-12-28 DIAGNOSIS — M542 Cervicalgia: Secondary | ICD-10-CM | POA: Diagnosis not present

## 2015-12-28 DIAGNOSIS — M7918 Myalgia, other site: Secondary | ICD-10-CM

## 2015-12-28 NOTE — Therapy (Addendum)
[place], [place], [address] Phone: [phone]   Fax:  [phone]  Physical Therapy Treatment  Patient Details  Name: Alex Mccarty MRN: [id number] Date of Birth: Jul 09, 1946 Referring Provider: Dr. Thom Chimes  Encounter Date: 12/28/2015      PT End of Session - 12/28/15 0821    Visit Number 6   Number of Visits 12   Date for PT Re-Evaluation 01/21/16   PT Start Time 0801   PT Stop Time 2683   PT Time Calculation (min) 56 min   Activity Tolerance Patient tolerated treatment well      Past Medical History  Diagnosis Date  . Kidney stones   . Hypertension   . Hyperlipidemia   . Diabetes mellitus ([place])   . CKD (chronic kidney disease)     unkknown stage per patient    Past Surgical History  Procedure Laterality Date  . Foot surgery      crushed heel  . Elbow surgery      ulnar nerve repair    There were no vitals filed for this visit.      Subjective Assessment - 12/28/15 0804    Subjective Improving - still has that spot in the Lt side of his neck that is tight and tender. Has tried icing which seems to help.   Currently in Pain? No/denies            [facility] PT Assessment - 12/28/15 0001    Assessment   Medical Diagnosis Cervicalgia   Referring Provider Dr. Thom Chimes   Onset Date/Surgical Date 11/02/15   Hand Dominance Right   Next MD Visit 12/29/15   AROM   Cervical Flexion 60   Cervical Extension 34   Cervical - Right Side Bend 36   Cervical - Left Side Bend 40   Cervical - Right Rotation 67   Cervical - Left Rotation 52   Strength   Overall Strength Comments 5/5 bilat UE's    Palpation   Spinal mobility decreased tightness and pain with CPA mobs mid cervical musculature; UPA Lt C4/5/6   Palpation comment improving musculature tightness through anterior cervical area SCM;lateral cervical scaleni/trap; posterior cervical traps/cervical paraspinals as well  as pecs Lt > Rt                      [facility] Adult PT Treatment/Exercise - 12/28/15 0001    Neck Exercises: Machines for Strengthening   UBE (Upper Arm Bike) L3x4' alt FWD/BWD   Neck Exercises: Standing   Neck Retraction 5 reps;10 secs   Neck Exercises: Seated   Other Seated Exercise cerical sidebend stretching/rotation x 20 sec bilat.    Neck Exercises: Supine   Neck Retraction 5 reps;10 secs  2 sets - towel roll under cervical curve   Other Supine Exercise neural tension stretch 1 min x 2 reps   Shoulder Exercises: Standing   Extension Strengthening;Both;10 reps;Theraband   Theraband Level (Shoulder Extension) Level 3 (Green)   Row Strengthening;Both;10 reps;Theraband   Theraband Level (Shoulder Row) Level 3 (Green)   Row Limitations added row at 45 deg x 10 green TB    Retraction Strengthening;Both;10 reps;Theraband   Theraband Level (Shoulder Retraction) Level 2 (Red)   Other Standing Exercises scap squeeze with noodle 10 sec x 10    Shoulder Exercises: Stretch   Other Shoulder Stretches 3 way doorway 30 sec x 3 each position    Modalities  Modalities Electrical Stimulation;Moist Heat   Moist Heat Therapy   Number Minutes Moist Heat 15 Minutes   Moist Heat Location Cervical   Electrical Stimulation   Electrical Stimulation Location bilat C5; Lt paracervical mm; :t posterior upper trap    Electrical Stimulation Action IFC   Electrical Stimulation Parameters to tolerance   Electrical Stimulation Goals Tone;Pain   Ultrasound   Ultrasound Location Lt lateral cervical C3-6 area    Ultrasound Parameters 1 mHz; 100%; 1.5 w/cm2; 8 min    Ultrasound Goals Other (Comment)  tightness    Manual Therapy   Manual Therapy Joint mobilization;Soft tissue mobilization;Myofascial release;Passive ROM;Manual Traction   Manual therapy comments pt supine   Joint Mobilization c-spine,   grade III CPA, UPA with focus on Lt C5-6/6-7   Soft tissue mobilization cervical paraspinals    Myofascial Release pecs/anterior chest    Passive ROM cervical stretching into flexioin; flexioin with lateral component; scaleni stretches; neural mobilization    Manual Traction cervical 3-4 reps 20-30 sec hold                 PT Education - 12/28/15 0835    Education provided Yes   Education Details HEP   Person(s) Educated Patient   Methods Explanation;Demonstration;Tactile cues;Verbal cues;Handout   Comprehension Verbalized understanding;Returned demonstration;Verbal cues required;Tactile cues required             PT Long Term Goals - 12/28/15 0941    PT LONG TERM GOAL #1   Title Improve posture and alignment with pt to demonstrate good upright posture 01/21/16   Time 6   Period Weeks   Status On-going   PT LONG TERM GOAL #2   Title Improve cervical ROM with pt to demonstrate painfree ROM WNL's throughout 01/21/16   Time 6   Period Weeks   Status On-going   PT LONG TERM GOAL #3   Title Patient demonstrates painfree functional ROM to check traffic when stopped at a stop sign 01/21/16   Time 6   Period Weeks   Status On-going   PT LONG TERM GOAL #4   Title Improved soft tissue extensibility and spinal mobility through cervical spine to palpation 01/21/16   Time 6   Period Weeks   Status On-going   PT LONG TERM GOAL #5   Title I in HEP 01/21/16   Time 6   Period Weeks   Status On-going   PT LONG TERM GOAL #6   Title Improve FOTO to </= to 67% limitation 01/21/16   Time 6   Period Weeks   Status On-going               Plan - 12/28/15 2671    Clinical Impression Statement Patient is progressing well with cervical rehab. He has improved cervical ROM/mobility; decreased radicular Lt UE symptoms; less stiffness and tightness. He is progressing well toward stated goals of treatment.    Rehab Potential Good   PT Frequency 2x / week   PT Duration 6 weeks   PT Treatment/Interventions Patient/family education;ADLs/Self Care Home Management;Therapeutic  exercise;Therapeutic activities;Manual techniques;Dry needling;Neuromuscular re-education;Cryotherapy;Electrical Stimulation;Iontophoresis 46m/ml Dexamethasone;Moist Heat;Ultrasound;Traction   PT Next Visit Plan Progress with pec stretching; work on posture and alignment; posterior shoulder girdle strengthening; manual work through cervical spine; TDN as indicated; manual cervical traction and stretching. Assess response to manual work and neural mobilization    PT Home Exercise Plan postural correction; HEP    Consulted and Agree with Plan of Care Patient  Patient will benefit from skilled therapeutic intervention in order to improve the following deficits and impairments:  Postural dysfunction, Improper body mechanics, Decreased range of motion, Decreased mobility, Increased fascial restricitons, Increased muscle spasms, Decreased endurance, Decreased activity tolerance, Pain  Visit Diagnosis: Stiffness of neck  Myofacial muscle pain  Impaired functional mobility and endurance  Cervicalgia  Other symptoms and signs involving the musculoskeletal system  Myalgia     Problem List Patient Active Problem List   Diagnosis Date Noted  . Vision, loss, sudden 09/23/2014  . Amaurosis fugax of right eye 09/09/2014  . Benign paroxysmal positional vertigo 09/09/2014  . Headache 09/09/2014  . Vertigo 09/09/2014    Janesia Joswick Nilda Simmer PT, MPH  12/28/2015, 9:43 AM  [facility] [place], [place], [address] Phone: [phone]   Fax:  [phone]  Name: Alex Mccarty MRN: [id number] Date of Birth: December 22, 1945    PHYSICAL THERAPY DISCHARGE SUMMARY  Visits from Start of Care: 6  Current functional level related to goals / functional outcomes: Progressing gradually toward stated goals of therapy.    Remaining deficits: Needs to continue with exercise and postural correction    Education /  Equipment: HEP Plan: Patient agrees to discharge.  Patient goals were partially met. Patient is being discharged due to not returning since the last visit.  ?????    Brenlynn Fake P. Helene Kelp PT, MPH 03/10/2016 11:40 AM

## 2015-12-28 NOTE — Patient Instructions (Signed)
Scapular Retraction: Rowing (Eccentric) - Arms - 45 Degrees (Resistance Band)    Hold end of band in each hand. Pull back until elbows are even with trunk. Keep elbows out from sides at 45, thumbs up. Slowly release for 3-5 seconds. Use ___red or green_____ resistance band. _10__ reps per set, _1-3__ sets per day. 1 x /day

## 2015-12-29 DIAGNOSIS — R251 Tremor, unspecified: Secondary | ICD-10-CM | POA: Diagnosis not present

## 2015-12-29 DIAGNOSIS — M542 Cervicalgia: Secondary | ICD-10-CM | POA: Diagnosis not present

## 2015-12-29 DIAGNOSIS — G250 Essential tremor: Secondary | ICD-10-CM | POA: Diagnosis not present

## 2015-12-30 ENCOUNTER — Encounter: Payer: PRIVATE HEALTH INSURANCE | Admitting: Rehabilitative and Restorative Service Providers"

## 2016-01-01 DIAGNOSIS — M064 Inflammatory polyarthropathy: Secondary | ICD-10-CM | POA: Diagnosis not present

## 2016-01-01 DIAGNOSIS — M9971 Connective tissue and disc stenosis of intervertebral foramina of cervical region: Secondary | ICD-10-CM | POA: Diagnosis not present

## 2016-01-01 DIAGNOSIS — M542 Cervicalgia: Secondary | ICD-10-CM | POA: Diagnosis not present

## 2016-01-01 DIAGNOSIS — R609 Edema, unspecified: Secondary | ICD-10-CM | POA: Diagnosis not present

## 2016-01-01 DIAGNOSIS — M2578 Osteophyte, vertebrae: Secondary | ICD-10-CM | POA: Diagnosis not present

## 2016-01-01 DIAGNOSIS — M50221 Other cervical disc displacement at C4-C5 level: Secondary | ICD-10-CM | POA: Diagnosis not present

## 2016-01-27 DIAGNOSIS — M4692 Unspecified inflammatory spondylopathy, cervical region: Secondary | ICD-10-CM | POA: Diagnosis not present

## 2016-01-27 DIAGNOSIS — M4722 Other spondylosis with radiculopathy, cervical region: Secondary | ICD-10-CM | POA: Diagnosis not present

## 2016-01-27 DIAGNOSIS — M542 Cervicalgia: Secondary | ICD-10-CM | POA: Diagnosis not present

## 2016-02-05 DIAGNOSIS — R251 Tremor, unspecified: Secondary | ICD-10-CM | POA: Diagnosis not present

## 2016-02-05 DIAGNOSIS — R8299 Other abnormal findings in urine: Secondary | ICD-10-CM | POA: Diagnosis not present

## 2016-02-05 DIAGNOSIS — N183 Chronic kidney disease, stage 3 (moderate): Secondary | ICD-10-CM | POA: Diagnosis not present

## 2016-02-11 DIAGNOSIS — R8299 Other abnormal findings in urine: Secondary | ICD-10-CM | POA: Diagnosis not present

## 2016-02-11 DIAGNOSIS — N189 Chronic kidney disease, unspecified: Secondary | ICD-10-CM | POA: Diagnosis not present

## 2016-03-08 DIAGNOSIS — Z125 Encounter for screening for malignant neoplasm of prostate: Secondary | ICD-10-CM | POA: Diagnosis not present

## 2016-03-08 DIAGNOSIS — N189 Chronic kidney disease, unspecified: Secondary | ICD-10-CM | POA: Diagnosis not present

## 2016-03-15 DIAGNOSIS — R8299 Other abnormal findings in urine: Secondary | ICD-10-CM | POA: Diagnosis not present

## 2016-03-21 DIAGNOSIS — N183 Chronic kidney disease, stage 3 (moderate): Secondary | ICD-10-CM | POA: Diagnosis not present

## 2016-03-21 DIAGNOSIS — I129 Hypertensive chronic kidney disease with stage 1 through stage 4 chronic kidney disease, or unspecified chronic kidney disease: Secondary | ICD-10-CM | POA: Diagnosis not present

## 2016-03-23 DIAGNOSIS — N183 Chronic kidney disease, stage 3 (moderate): Secondary | ICD-10-CM | POA: Diagnosis not present

## 2016-03-23 DIAGNOSIS — E1122 Type 2 diabetes mellitus with diabetic chronic kidney disease: Secondary | ICD-10-CM | POA: Diagnosis not present

## 2016-03-23 DIAGNOSIS — I129 Hypertensive chronic kidney disease with stage 1 through stage 4 chronic kidney disease, or unspecified chronic kidney disease: Secondary | ICD-10-CM | POA: Diagnosis not present

## 2016-04-12 DIAGNOSIS — Z794 Long term (current) use of insulin: Secondary | ICD-10-CM | POA: Diagnosis not present

## 2016-04-12 DIAGNOSIS — E1121 Type 2 diabetes mellitus with diabetic nephropathy: Secondary | ICD-10-CM | POA: Diagnosis not present

## 2016-06-06 DIAGNOSIS — Z23 Encounter for immunization: Secondary | ICD-10-CM | POA: Diagnosis not present

## 2016-06-06 DIAGNOSIS — N200 Calculus of kidney: Secondary | ICD-10-CM | POA: Diagnosis not present

## 2016-06-06 DIAGNOSIS — N6489 Other specified disorders of breast: Secondary | ICD-10-CM | POA: Diagnosis not present

## 2016-06-13 DIAGNOSIS — J209 Acute bronchitis, unspecified: Secondary | ICD-10-CM | POA: Diagnosis not present

## 2016-06-13 DIAGNOSIS — R05 Cough: Secondary | ICD-10-CM | POA: Diagnosis not present

## 2016-06-15 DIAGNOSIS — J209 Acute bronchitis, unspecified: Secondary | ICD-10-CM | POA: Diagnosis not present

## 2016-06-15 DIAGNOSIS — N62 Hypertrophy of breast: Secondary | ICD-10-CM | POA: Diagnosis not present

## 2016-06-15 DIAGNOSIS — R928 Other abnormal and inconclusive findings on diagnostic imaging of breast: Secondary | ICD-10-CM | POA: Diagnosis not present

## 2016-06-15 DIAGNOSIS — R05 Cough: Secondary | ICD-10-CM | POA: Diagnosis not present

## 2016-06-15 DIAGNOSIS — J984 Other disorders of lung: Secondary | ICD-10-CM | POA: Diagnosis not present

## 2016-06-15 DIAGNOSIS — N6489 Other specified disorders of breast: Secondary | ICD-10-CM | POA: Diagnosis not present

## 2016-07-19 DIAGNOSIS — M5136 Other intervertebral disc degeneration, lumbar region: Secondary | ICD-10-CM | POA: Diagnosis not present

## 2016-07-19 DIAGNOSIS — R208 Other disturbances of skin sensation: Secondary | ICD-10-CM | POA: Diagnosis not present

## 2016-07-19 DIAGNOSIS — M48061 Spinal stenosis, lumbar region without neurogenic claudication: Secondary | ICD-10-CM | POA: Diagnosis not present

## 2016-07-19 DIAGNOSIS — M62838 Other muscle spasm: Secondary | ICD-10-CM | POA: Diagnosis not present

## 2016-07-19 DIAGNOSIS — M4726 Other spondylosis with radiculopathy, lumbar region: Secondary | ICD-10-CM | POA: Diagnosis not present

## 2016-08-01 DIAGNOSIS — E1121 Type 2 diabetes mellitus with diabetic nephropathy: Secondary | ICD-10-CM | POA: Diagnosis not present

## 2016-08-01 DIAGNOSIS — Z794 Long term (current) use of insulin: Secondary | ICD-10-CM | POA: Diagnosis not present

## 2016-08-09 DIAGNOSIS — M5136 Other intervertebral disc degeneration, lumbar region: Secondary | ICD-10-CM | POA: Diagnosis not present

## 2016-08-09 DIAGNOSIS — M4726 Other spondylosis with radiculopathy, lumbar region: Secondary | ICD-10-CM | POA: Diagnosis not present

## 2016-08-09 DIAGNOSIS — M5416 Radiculopathy, lumbar region: Secondary | ICD-10-CM | POA: Diagnosis not present

## 2016-09-15 DIAGNOSIS — N183 Chronic kidney disease, stage 3 (moderate): Secondary | ICD-10-CM | POA: Diagnosis not present

## 2016-09-20 DIAGNOSIS — I129 Hypertensive chronic kidney disease with stage 1 through stage 4 chronic kidney disease, or unspecified chronic kidney disease: Secondary | ICD-10-CM | POA: Diagnosis not present

## 2016-09-20 DIAGNOSIS — N184 Chronic kidney disease, stage 4 (severe): Secondary | ICD-10-CM | POA: Diagnosis not present

## 2016-09-20 DIAGNOSIS — N183 Chronic kidney disease, stage 3 (moderate): Secondary | ICD-10-CM | POA: Diagnosis not present

## 2016-09-20 DIAGNOSIS — E1122 Type 2 diabetes mellitus with diabetic chronic kidney disease: Secondary | ICD-10-CM | POA: Diagnosis not present

## 2016-09-20 DIAGNOSIS — Z794 Long term (current) use of insulin: Secondary | ICD-10-CM | POA: Diagnosis not present

## 2016-10-11 DIAGNOSIS — F419 Anxiety disorder, unspecified: Secondary | ICD-10-CM | POA: Diagnosis not present

## 2016-10-11 DIAGNOSIS — R42 Dizziness and giddiness: Secondary | ICD-10-CM | POA: Diagnosis not present

## 2016-10-11 DIAGNOSIS — T753XXA Motion sickness, initial encounter: Secondary | ICD-10-CM | POA: Diagnosis not present

## 2016-10-13 ENCOUNTER — Ambulatory Visit: Payer: PPO | Admitting: Family Medicine

## 2016-10-21 DIAGNOSIS — Z794 Long term (current) use of insulin: Secondary | ICD-10-CM | POA: Diagnosis not present

## 2016-10-21 DIAGNOSIS — I129 Hypertensive chronic kidney disease with stage 1 through stage 4 chronic kidney disease, or unspecified chronic kidney disease: Secondary | ICD-10-CM | POA: Diagnosis not present

## 2016-10-21 DIAGNOSIS — N183 Chronic kidney disease, stage 3 (moderate): Secondary | ICD-10-CM | POA: Diagnosis not present

## 2016-10-21 DIAGNOSIS — E1122 Type 2 diabetes mellitus with diabetic chronic kidney disease: Secondary | ICD-10-CM | POA: Diagnosis not present

## 2016-12-30 DIAGNOSIS — I10 Essential (primary) hypertension: Secondary | ICD-10-CM | POA: Diagnosis not present

## 2016-12-30 DIAGNOSIS — H52223 Regular astigmatism, bilateral: Secondary | ICD-10-CM | POA: Diagnosis not present

## 2016-12-30 DIAGNOSIS — H35033 Hypertensive retinopathy, bilateral: Secondary | ICD-10-CM | POA: Diagnosis not present

## 2016-12-30 DIAGNOSIS — G8929 Other chronic pain: Secondary | ICD-10-CM | POA: Diagnosis not present

## 2016-12-30 DIAGNOSIS — M25511 Pain in right shoulder: Secondary | ICD-10-CM | POA: Diagnosis not present

## 2016-12-30 DIAGNOSIS — E113393 Type 2 diabetes mellitus with moderate nonproliferative diabetic retinopathy without macular edema, bilateral: Secondary | ICD-10-CM | POA: Diagnosis not present

## 2016-12-30 DIAGNOSIS — H524 Presbyopia: Secondary | ICD-10-CM | POA: Diagnosis not present

## 2017-01-06 DIAGNOSIS — M25561 Pain in right knee: Secondary | ICD-10-CM | POA: Diagnosis not present

## 2017-01-06 DIAGNOSIS — M25511 Pain in right shoulder: Secondary | ICD-10-CM | POA: Diagnosis not present

## 2017-01-06 DIAGNOSIS — M1711 Unilateral primary osteoarthritis, right knee: Secondary | ICD-10-CM | POA: Diagnosis not present

## 2017-01-06 DIAGNOSIS — G8929 Other chronic pain: Secondary | ICD-10-CM | POA: Diagnosis not present

## 2017-01-12 DIAGNOSIS — M25511 Pain in right shoulder: Secondary | ICD-10-CM | POA: Diagnosis not present

## 2017-01-12 DIAGNOSIS — M7541 Impingement syndrome of right shoulder: Secondary | ICD-10-CM | POA: Diagnosis not present

## 2017-01-24 DIAGNOSIS — I10 Essential (primary) hypertension: Secondary | ICD-10-CM | POA: Diagnosis not present

## 2017-01-30 DIAGNOSIS — H35033 Hypertensive retinopathy, bilateral: Secondary | ICD-10-CM | POA: Diagnosis not present

## 2017-01-30 DIAGNOSIS — Z961 Presence of intraocular lens: Secondary | ICD-10-CM | POA: Diagnosis not present

## 2017-01-30 DIAGNOSIS — E113393 Type 2 diabetes mellitus with moderate nonproliferative diabetic retinopathy without macular edema, bilateral: Secondary | ICD-10-CM | POA: Diagnosis not present

## 2017-02-15 DIAGNOSIS — I10 Essential (primary) hypertension: Secondary | ICD-10-CM | POA: Diagnosis not present

## 2017-02-15 DIAGNOSIS — E875 Hyperkalemia: Secondary | ICD-10-CM | POA: Diagnosis not present

## 2017-02-15 DIAGNOSIS — Z794 Long term (current) use of insulin: Secondary | ICD-10-CM | POA: Diagnosis not present

## 2017-02-15 DIAGNOSIS — E1121 Type 2 diabetes mellitus with diabetic nephropathy: Secondary | ICD-10-CM | POA: Diagnosis not present

## 2017-02-16 DIAGNOSIS — E875 Hyperkalemia: Secondary | ICD-10-CM | POA: Diagnosis not present

## 2017-02-22 DIAGNOSIS — Z961 Presence of intraocular lens: Secondary | ICD-10-CM | POA: Diagnosis not present

## 2017-02-22 DIAGNOSIS — H35033 Hypertensive retinopathy, bilateral: Secondary | ICD-10-CM | POA: Diagnosis not present

## 2017-02-22 DIAGNOSIS — E113393 Type 2 diabetes mellitus with moderate nonproliferative diabetic retinopathy without macular edema, bilateral: Secondary | ICD-10-CM | POA: Diagnosis not present

## 2017-02-23 DIAGNOSIS — I129 Hypertensive chronic kidney disease with stage 1 through stage 4 chronic kidney disease, or unspecified chronic kidney disease: Secondary | ICD-10-CM | POA: Diagnosis not present

## 2017-02-23 DIAGNOSIS — E1122 Type 2 diabetes mellitus with diabetic chronic kidney disease: Secondary | ICD-10-CM | POA: Diagnosis not present

## 2017-02-23 DIAGNOSIS — Z794 Long term (current) use of insulin: Secondary | ICD-10-CM | POA: Diagnosis not present

## 2017-02-23 DIAGNOSIS — N183 Chronic kidney disease, stage 3 (moderate): Secondary | ICD-10-CM | POA: Diagnosis not present

## 2017-03-17 DIAGNOSIS — H35033 Hypertensive retinopathy, bilateral: Secondary | ICD-10-CM | POA: Diagnosis not present

## 2017-03-17 DIAGNOSIS — E113393 Type 2 diabetes mellitus with moderate nonproliferative diabetic retinopathy without macular edema, bilateral: Secondary | ICD-10-CM | POA: Diagnosis not present

## 2017-03-17 DIAGNOSIS — Z961 Presence of intraocular lens: Secondary | ICD-10-CM | POA: Diagnosis not present

## 2017-04-18 DIAGNOSIS — E113393 Type 2 diabetes mellitus with moderate nonproliferative diabetic retinopathy without macular edema, bilateral: Secondary | ICD-10-CM | POA: Diagnosis not present

## 2017-04-18 DIAGNOSIS — G453 Amaurosis fugax: Secondary | ICD-10-CM | POA: Diagnosis not present

## 2017-04-18 DIAGNOSIS — Z961 Presence of intraocular lens: Secondary | ICD-10-CM | POA: Diagnosis not present

## 2017-04-18 DIAGNOSIS — H35033 Hypertensive retinopathy, bilateral: Secondary | ICD-10-CM | POA: Diagnosis not present

## 2017-04-20 DIAGNOSIS — G453 Amaurosis fugax: Secondary | ICD-10-CM | POA: Diagnosis not present

## 2017-06-23 DIAGNOSIS — N183 Chronic kidney disease, stage 3 (moderate): Secondary | ICD-10-CM | POA: Diagnosis not present

## 2017-06-26 DIAGNOSIS — Z794 Long term (current) use of insulin: Secondary | ICD-10-CM | POA: Diagnosis not present

## 2017-06-26 DIAGNOSIS — N183 Chronic kidney disease, stage 3 (moderate): Secondary | ICD-10-CM | POA: Diagnosis not present

## 2017-06-26 DIAGNOSIS — E1122 Type 2 diabetes mellitus with diabetic chronic kidney disease: Secondary | ICD-10-CM | POA: Diagnosis not present

## 2017-06-26 DIAGNOSIS — I129 Hypertensive chronic kidney disease with stage 1 through stage 4 chronic kidney disease, or unspecified chronic kidney disease: Secondary | ICD-10-CM | POA: Diagnosis not present

## 2017-06-29 DIAGNOSIS — M48061 Spinal stenosis, lumbar region without neurogenic claudication: Secondary | ICD-10-CM | POA: Diagnosis not present

## 2017-06-29 DIAGNOSIS — M4722 Other spondylosis with radiculopathy, cervical region: Secondary | ICD-10-CM | POA: Diagnosis not present

## 2017-06-29 DIAGNOSIS — M5136 Other intervertebral disc degeneration, lumbar region: Secondary | ICD-10-CM | POA: Diagnosis not present

## 2017-06-29 DIAGNOSIS — M5416 Radiculopathy, lumbar region: Secondary | ICD-10-CM | POA: Diagnosis not present

## 2017-07-03 DIAGNOSIS — M48061 Spinal stenosis, lumbar region without neurogenic claudication: Secondary | ICD-10-CM | POA: Diagnosis not present

## 2017-07-03 DIAGNOSIS — M5136 Other intervertebral disc degeneration, lumbar region: Secondary | ICD-10-CM | POA: Diagnosis not present

## 2017-07-11 DIAGNOSIS — E1122 Type 2 diabetes mellitus with diabetic chronic kidney disease: Secondary | ICD-10-CM | POA: Diagnosis not present

## 2017-07-11 DIAGNOSIS — M48061 Spinal stenosis, lumbar region without neurogenic claudication: Secondary | ICD-10-CM | POA: Diagnosis not present

## 2017-07-11 DIAGNOSIS — M4726 Other spondylosis with radiculopathy, lumbar region: Secondary | ICD-10-CM | POA: Diagnosis not present

## 2017-07-11 DIAGNOSIS — M545 Low back pain: Secondary | ICD-10-CM | POA: Diagnosis not present

## 2017-07-12 DIAGNOSIS — R739 Hyperglycemia, unspecified: Secondary | ICD-10-CM | POA: Diagnosis not present

## 2017-07-12 DIAGNOSIS — H6983 Other specified disorders of Eustachian tube, bilateral: Secondary | ICD-10-CM | POA: Diagnosis not present

## 2017-07-12 DIAGNOSIS — R42 Dizziness and giddiness: Secondary | ICD-10-CM | POA: Diagnosis not present

## 2017-08-14 DIAGNOSIS — M4722 Other spondylosis with radiculopathy, cervical region: Secondary | ICD-10-CM | POA: Diagnosis not present

## 2017-08-14 DIAGNOSIS — M48061 Spinal stenosis, lumbar region without neurogenic claudication: Secondary | ICD-10-CM | POA: Diagnosis not present

## 2017-08-14 DIAGNOSIS — E1122 Type 2 diabetes mellitus with diabetic chronic kidney disease: Secondary | ICD-10-CM | POA: Diagnosis not present

## 2017-08-14 DIAGNOSIS — M5136 Other intervertebral disc degeneration, lumbar region: Secondary | ICD-10-CM | POA: Diagnosis not present

## 2017-08-18 DIAGNOSIS — S82001A Unspecified fracture of right patella, initial encounter for closed fracture: Secondary | ICD-10-CM | POA: Diagnosis not present

## 2017-08-18 DIAGNOSIS — M1711 Unilateral primary osteoarthritis, right knee: Secondary | ICD-10-CM | POA: Diagnosis not present

## 2017-08-18 DIAGNOSIS — M25561 Pain in right knee: Secondary | ICD-10-CM | POA: Diagnosis not present

## 2017-09-08 DIAGNOSIS — S82001D Unspecified fracture of right patella, subsequent encounter for closed fracture with routine healing: Secondary | ICD-10-CM | POA: Diagnosis not present

## 2017-09-28 ENCOUNTER — Emergency Department (HOSPITAL_BASED_OUTPATIENT_CLINIC_OR_DEPARTMENT_OTHER): Payer: PPO

## 2017-09-28 ENCOUNTER — Encounter (HOSPITAL_BASED_OUTPATIENT_CLINIC_OR_DEPARTMENT_OTHER): Payer: Self-pay | Admitting: *Deleted

## 2017-09-28 ENCOUNTER — Emergency Department: Payer: PPO

## 2017-09-28 ENCOUNTER — Inpatient Hospital Stay (HOSPITAL_BASED_OUTPATIENT_CLINIC_OR_DEPARTMENT_OTHER): Payer: PPO

## 2017-09-28 ENCOUNTER — Other Ambulatory Visit: Payer: Self-pay

## 2017-09-28 ENCOUNTER — Inpatient Hospital Stay (HOSPITAL_BASED_OUTPATIENT_CLINIC_OR_DEPARTMENT_OTHER)
Admission: EM | Admit: 2017-09-28 | Discharge: 2017-10-06 | DRG: 330 | Disposition: A | Discharge status: Home / Self Care | Payer: PPO | Attending: General Surgery | Admitting: General Surgery

## 2017-09-28 DIAGNOSIS — N179 Acute kidney failure, unspecified: Secondary | ICD-10-CM | POA: Diagnosis present

## 2017-09-28 DIAGNOSIS — E785 Hyperlipidemia, unspecified: Secondary | ICD-10-CM | POA: Diagnosis present

## 2017-09-28 DIAGNOSIS — Z7982 Long term (current) use of aspirin: Secondary | ICD-10-CM | POA: Diagnosis not present

## 2017-09-28 DIAGNOSIS — Z794 Long term (current) use of insulin: Secondary | ICD-10-CM

## 2017-09-28 DIAGNOSIS — K219 Gastro-esophageal reflux disease without esophagitis: Secondary | ICD-10-CM | POA: Diagnosis present

## 2017-09-28 DIAGNOSIS — H8110 Benign paroxysmal vertigo, unspecified ear: Secondary | ICD-10-CM | POA: Diagnosis present

## 2017-09-28 DIAGNOSIS — Z880 Allergy status to penicillin: Secondary | ICD-10-CM | POA: Diagnosis not present

## 2017-09-28 DIAGNOSIS — K56609 Unspecified intestinal obstruction, unspecified as to partial versus complete obstruction: Secondary | ICD-10-CM | POA: Diagnosis present

## 2017-09-28 DIAGNOSIS — Z882 Allergy status to sulfonamides status: Secondary | ICD-10-CM

## 2017-09-28 DIAGNOSIS — R140 Abdominal distension (gaseous): Secondary | ICD-10-CM | POA: Diagnosis not present

## 2017-09-28 DIAGNOSIS — E1122 Type 2 diabetes mellitus with diabetic chronic kidney disease: Secondary | ICD-10-CM | POA: Diagnosis present

## 2017-09-28 DIAGNOSIS — K56699 Other intestinal obstruction unspecified as to partial versus complete obstruction: Secondary | ICD-10-CM | POA: Diagnosis not present

## 2017-09-28 DIAGNOSIS — Z885 Allergy status to narcotic agent status: Secondary | ICD-10-CM

## 2017-09-28 DIAGNOSIS — K562 Volvulus: Principal | ICD-10-CM

## 2017-09-28 DIAGNOSIS — I159 Secondary hypertension, unspecified: Secondary | ICD-10-CM | POA: Diagnosis not present

## 2017-09-28 DIAGNOSIS — I129 Hypertensive chronic kidney disease with stage 1 through stage 4 chronic kidney disease, or unspecified chronic kidney disease: Secondary | ICD-10-CM | POA: Diagnosis present

## 2017-09-28 DIAGNOSIS — N184 Chronic kidney disease, stage 4 (severe): Secondary | ICD-10-CM | POA: Diagnosis present

## 2017-09-28 DIAGNOSIS — I10 Essential (primary) hypertension: Secondary | ICD-10-CM | POA: Diagnosis not present

## 2017-09-28 DIAGNOSIS — R1084 Generalized abdominal pain: Secondary | ICD-10-CM | POA: Diagnosis not present

## 2017-09-28 DIAGNOSIS — N183 Chronic kidney disease, stage 3 unspecified: Secondary | ICD-10-CM | POA: Diagnosis present

## 2017-09-28 DIAGNOSIS — E860 Dehydration: Secondary | ICD-10-CM | POA: Diagnosis present

## 2017-09-28 DIAGNOSIS — E1121 Type 2 diabetes mellitus with diabetic nephropathy: Secondary | ICD-10-CM | POA: Diagnosis present

## 2017-09-28 DIAGNOSIS — Z87891 Personal history of nicotine dependence: Secondary | ICD-10-CM

## 2017-09-28 DIAGNOSIS — Z79899 Other long term (current) drug therapy: Secondary | ICD-10-CM | POA: Diagnosis not present

## 2017-09-28 DIAGNOSIS — K559 Vascular disorder of intestine, unspecified: Secondary | ICD-10-CM | POA: Diagnosis not present

## 2017-09-28 DIAGNOSIS — K55039 Acute (reversible) ischemia of large intestine, extent unspecified: Secondary | ICD-10-CM | POA: Diagnosis not present

## 2017-09-28 DIAGNOSIS — Z0189 Encounter for other specified special examinations: Secondary | ICD-10-CM

## 2017-09-28 DIAGNOSIS — Z8249 Family history of ischemic heart disease and other diseases of the circulatory system: Secondary | ICD-10-CM | POA: Diagnosis not present

## 2017-09-28 DIAGNOSIS — R110 Nausea: Secondary | ICD-10-CM | POA: Diagnosis not present

## 2017-09-28 DIAGNOSIS — Z4682 Encounter for fitting and adjustment of non-vascular catheter: Secondary | ICD-10-CM | POA: Diagnosis not present

## 2017-09-28 DIAGNOSIS — N281 Cyst of kidney, acquired: Secondary | ICD-10-CM | POA: Diagnosis not present

## 2017-09-28 LAB — COMPREHENSIVE METABOLIC PANEL
ALT: 23 U/L (ref 17–63)
AST: 17 U/L (ref 15–41)
Albumin: 3.8 g/dL (ref 3.5–5.0)
Alkaline Phosphatase: 65 U/L (ref 38–126)
Anion gap: 9 (ref 5–15)
BUN: 27 mg/dL — ABNORMAL HIGH (ref 6–20)
CO2: 22 mmol/L (ref 22–32)
Calcium: 9.4 mg/dL (ref 8.9–10.3)
Chloride: 107 mmol/L (ref 101–111)
Creatinine, Ser: 2.12 mg/dL — ABNORMAL HIGH (ref 0.61–1.24)
GFR calc Af Amer: 35 mL/min — ABNORMAL LOW (ref >60)
GFR calc non Af Amer: 30 mL/min — ABNORMAL LOW (ref >60)
Glucose, Bld: 151 mg/dL — ABNORMAL HIGH (ref 65–99)
Potassium: 4 mmol/L (ref 3.5–5.1)
Sodium: 138 mmol/L (ref 135–145)
Total Bilirubin: 1.1 mg/dL (ref 0.3–1.2)
Total Protein: 7.5 g/dL (ref 6.5–8.1)

## 2017-09-28 LAB — CBC
HCT: 43.8 % (ref 39.0–52.0)
Hemoglobin: 14.4 g/dL (ref 13.0–17.0)
MCH: 30.8 pg (ref 26.0–34.0)
MCHC: 32.9 g/dL (ref 30.0–36.0)
MCV: 93.8 fL (ref 78.0–100.0)
Platelets: 138 10*3/uL — ABNORMAL LOW (ref 150–400)
RBC: 4.67 MIL/uL (ref 4.22–5.81)
RDW: 13.8 % (ref 11.5–15.5)
WBC: 6.7 10*3/uL (ref 4.0–10.5)

## 2017-09-28 LAB — URINALYSIS, ROUTINE W REFLEX MICROSCOPIC
Bilirubin Urine: NEGATIVE
Glucose, UA: NEGATIVE mg/dL
Hgb urine dipstick: NEGATIVE
Ketones, ur: NEGATIVE mg/dL
Leukocytes, UA: NEGATIVE
Nitrite: NEGATIVE
Protein, ur: 100 mg/dL — AB
Specific Gravity, Urine: 1.02 (ref 1.005–1.030)
pH: 6 (ref 5.0–8.0)

## 2017-09-28 LAB — CBC WITH DIFFERENTIAL/PLATELET
Basophils Absolute: 0 10*3/uL (ref 0.0–0.1)
Basophils Relative: 0 %
Eosinophils Absolute: 0.2 10*3/uL (ref 0.0–0.7)
Eosinophils Relative: 3 %
HCT: 44.9 % (ref 39.0–52.0)
Hemoglobin: 15 g/dL (ref 13.0–17.0)
Lymphocytes Relative: 23 %
Lymphs Abs: 1.6 10*3/uL (ref 0.7–4.0)
MCH: 31 pg (ref 26.0–34.0)
MCHC: 33.4 g/dL (ref 30.0–36.0)
MCV: 92.8 fL (ref 78.0–100.0)
Monocytes Absolute: 0.5 10*3/uL (ref 0.1–1.0)
Monocytes Relative: 7 %
Neutro Abs: 4.8 10*3/uL (ref 1.7–7.7)
Neutrophils Relative %: 67 %
Platelets: 146 10*3/uL — ABNORMAL LOW (ref 150–400)
RBC: 4.84 MIL/uL (ref 4.22–5.81)
RDW: 14.2 % (ref 11.5–15.5)
WBC: 7.2 10*3/uL (ref 4.0–10.5)

## 2017-09-28 LAB — URINALYSIS, MICROSCOPIC (REFLEX): RBC / HPF: NONE SEEN RBC/hpf (ref 0–5)

## 2017-09-28 LAB — CREATININE, SERUM
Creatinine, Ser: 2.09 mg/dL — ABNORMAL HIGH (ref 0.61–1.24)
GFR calc Af Amer: 36 mL/min — ABNORMAL LOW (ref >60)
GFR calc non Af Amer: 31 mL/min — ABNORMAL LOW (ref >60)

## 2017-09-28 LAB — MAGNESIUM: Magnesium: 1.7 mg/dL (ref 1.7–2.4)

## 2017-09-28 LAB — HEMOGLOBIN A1C
Hgb A1c MFr Bld: 7 % — ABNORMAL HIGH (ref 4.8–5.6)
Mean Plasma Glucose: 154.2 mg/dL

## 2017-09-28 LAB — PHOSPHORUS: Phosphorus: 3.4 mg/dL (ref 2.5–4.6)

## 2017-09-28 LAB — LIPASE, BLOOD: Lipase: 35 U/L (ref 11–51)

## 2017-09-28 MED ORDER — HEPARIN SODIUM (PORCINE) 5000 UNIT/ML IJ SOLN
5000.0000 [IU] | Freq: Three times a day (TID) | INTRAMUSCULAR | Status: DC
  Administered 2017-09-28 – 2017-10-06 (×22): 5000 [IU] via SUBCUTANEOUS
  Filled 2017-09-28 (×21): qty 1

## 2017-09-28 MED ORDER — MENTHOL 3 MG MT LOZG: 1.0000 | LOZENGE | OROMUCOSAL | Status: DC | PRN

## 2017-09-28 MED ORDER — ACETAMINOPHEN 325 MG PO TABS: 650.0000 mg | ORAL_TABLET | Freq: Four times a day (QID) | ORAL | Status: DC | PRN

## 2017-09-28 MED ORDER — MORPHINE SULFATE (PF) 2 MG/ML IV SOLN
2.0000 mg | INTRAVENOUS | Status: DC | PRN
  Administered 2017-09-28: 2 mg via INTRAVENOUS
  Filled 2017-09-28: qty 1

## 2017-09-28 MED ORDER — INSULIN ASPART 100 UNIT/ML ~~LOC~~ SOLN
0.0000 [IU] | Freq: Three times a day (TID) | SUBCUTANEOUS | Status: DC
  Administered 2017-09-30 (×3): 2 [IU] via SUBCUTANEOUS
  Administered 2017-10-01 – 2017-10-02 (×2): 1 [IU] via SUBCUTANEOUS
  Administered 2017-10-02 (×2): 2 [IU] via SUBCUTANEOUS
  Administered 2017-10-03 – 2017-10-05 (×5): 1 [IU] via SUBCUTANEOUS
  Administered 2017-10-05: 2 [IU] via SUBCUTANEOUS

## 2017-09-28 MED ORDER — PHENOL 1.4 % MT LIQD
1.0000 | OROMUCOSAL | Status: DC | PRN
  Administered 2017-09-29: 1 via OROMUCOSAL
  Filled 2017-09-28: qty 177

## 2017-09-28 MED ORDER — ACETAMINOPHEN 650 MG RE SUPP: 650.0000 mg | Freq: Four times a day (QID) | RECTAL | Status: DC | PRN

## 2017-09-28 MED ORDER — INSULIN GLARGINE 100 UNIT/ML ~~LOC~~ SOLN
15.0000 [IU] | Freq: Every day | SUBCUTANEOUS | Status: DC
  Administered 2017-09-29 – 2017-10-05 (×8): 15 [IU] via SUBCUTANEOUS
  Filled 2017-09-28 (×9): qty 0.15

## 2017-09-28 MED ORDER — CHLORHEXIDINE GLUCONATE 0.12 % MT SOLN
15.0000 mL | Freq: Two times a day (BID) | OROMUCOSAL | Status: DC
  Administered 2017-09-28 – 2017-10-05 (×14): 15 mL via OROMUCOSAL
  Filled 2017-09-28 (×14): qty 15

## 2017-09-28 MED ORDER — ONDANSETRON HCL 4 MG/2ML IJ SOLN: 4.0000 mg | Freq: Four times a day (QID) | INTRAMUSCULAR | Status: DC | PRN

## 2017-09-28 MED ORDER — SODIUM CHLORIDE 0.9 % IV BOLUS (SEPSIS)
1000.0000 mL | Freq: Once | INTRAVENOUS | Status: AC
  Administered 2017-09-28: 1000 mL via INTRAVENOUS

## 2017-09-28 MED ORDER — ORAL CARE MOUTH RINSE
15.0000 mL | Freq: Two times a day (BID) | OROMUCOSAL | Status: DC
  Administered 2017-09-28 – 2017-10-05 (×11): 15 mL via OROMUCOSAL

## 2017-09-28 MED ORDER — HYDRALAZINE HCL 20 MG/ML IJ SOLN: 10.0000 mg | Freq: Three times a day (TID) | INTRAMUSCULAR | Status: DC | PRN

## 2017-09-28 MED ORDER — SODIUM CHLORIDE 0.9 % IV SOLN
INTRAVENOUS | Status: DC
  Administered 2017-09-28: 100 mL/h via INTRAVENOUS
  Administered 2017-09-29: 23:00:00 via INTRAVENOUS

## 2017-09-28 MED ORDER — BENZOCAINE (TOPICAL) 20 % EX AERO
INHALATION_SPRAY | Freq: Four times a day (QID) | CUTANEOUS | Status: DC | PRN
  Administered 2017-09-28: 1 via OROMUCOSAL
  Filled 2017-09-28 (×2): qty 57

## 2017-09-28 MED ORDER — PANTOPRAZOLE SODIUM 40 MG IV SOLR
40.0000 mg | Freq: Every day | INTRAVENOUS | Status: DC
  Administered 2017-09-28 – 2017-10-02 (×4): 40 mg via INTRAVENOUS
  Filled 2017-09-28 (×4): qty 40

## 2017-09-28 MED ORDER — PHENOL 1.4 % MT LIQD
1.0000 | OROMUCOSAL | Status: DC | PRN
  Filled 2017-09-28: qty 177
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
## 2017-09-28 NOTE — ED Notes (Signed)
NG tube advanced per XRay result. NAD Noted. Patient tolerated advancement

## 2017-09-28 NOTE — ED Triage Notes (Signed)
Pt c/o diffuse abd pain /distention   woth nausea X 1 day

## 2017-09-28 NOTE — ED Provider Notes (Signed)
[facility] EMERGENCY DEPARTMENT Provider Note  CSN: [id number] Arrival date & time: 09/28/17 4268  Chief Complaint(s) Abdominal Pain  HPI Alex Mccarty is a 72 y.o. male   The history is provided by the patient.  Abdominal Pain   This is a new problem. The current episode started yesterday. The problem occurs constantly. The problem has been gradually worsening. The pain is associated with an unknown factor. The pain is located in the generalized abdominal region. The quality of the pain is aching. The pain is moderate. Associated symptoms include nausea. Pertinent negatives include fever, diarrhea, flatus (since yesterday, which is unusual for patient), hematochezia, melena, vomiting, constipation and dysuria. The symptoms are aggravated by palpation and certain positions. The symptoms are relieved by being still.    Past Medical History Past Medical History:  Diagnosis Date  . CKD (chronic kidney disease)    unkknown stage per patient  . Diabetes mellitus ([place])   . Hyperlipidemia   . Hypertension   . Kidney stones    Patient Active Problem List   Diagnosis Date Noted  . Vision, loss, sudden 09/23/2014  . Amaurosis fugax of right eye 09/09/2014  . Benign paroxysmal positional vertigo 09/09/2014  . Headache 09/09/2014  . Vertigo 09/09/2014   Home Medication(s) Prior to Admission medications   Medication Sig Start Date End Date Taking? Authorizing Provider  ASPIRIN LOW DOSE 81 MG EC tablet  08/28/14   [provider]  atorvastatin (LIPITOR) 80 MG tablet  02/23/14   [provider]  Atorvastatin Calcium (LIPITOR PO) Take by mouth.    [provider]  cetirizine (ZYRTEC) 10 MG tablet Take 10 mg by mouth daily.    [provider]  diazepam (VALIUM) 5 MG tablet Take 1 tablet (5 mg total) by mouth every 6 (six) hours as needed (spasms). Patient not taking: Reported on 12/10/2015 11/13/15   Mesner, Corene Cornea, MD  glucosamine-chondroitin  (GLUCOSAMINE-CHONDROITIN DS) 500-400 MG tablet Take by mouth.    [provider]  glucosamine-chondroitin 500-400 MG tablet Take 1 tablet by mouth 3 (three) times daily.    [provider]  lansoprazole (PREVACID) 30 MG capsule Take 30 mg by mouth daily.    [provider]  LANTUS SOLOSTAR 100 UNIT/ML Solostar Pen  06/02/14   [provider]  losartan (COZAAR) 100 MG tablet  08/08/14   [provider]  meclizine (ANTIVERT) 25 MG tablet Take 1 tablet (25 mg total) by mouth 3 (three) times daily as needed for dizziness. Patient not taking: Reported on 12/10/2015 09/09/14   Melvenia Beam, MD  potassium citrate (UROCIT-K) 10 MEQ (1080 MG) SR tablet Take 10 mEq by mouth 3 (three) times daily with meals.    [provider]  PRESCRIPTION MEDICATION Rx medication for hypertension    [provider]  Vitamin D, Ergocalciferol, (DRISDOL) 50000 UNITS CAPS capsule  08/28/14   [provider]  Past Surgical History Past Surgical History:  Procedure Laterality Date  . ELBOW SURGERY     ulnar nerve repair  . FOOT SURGERY     crushed heel   Family History Family History  Problem Relation Age of Onset  . Kidney Stones Mother   . Heart disease Mother     Social History Social History   Tobacco Use  . Smoking status: Former Research scientist (life sciences)  . Smokeless tobacco: Never Used  Substance Use Topics  . Alcohol use: Yes    Alcohol/week: 0.0 oz    Comment: occasional  . Drug use: No   Allergies Codeine; Penicillins; and Sulfa antibiotics  Review of Systems Review of Systems  Constitutional: Negative for fever.  Gastrointestinal: Positive for abdominal pain and nausea. Negative for constipation, diarrhea, flatus (since yesterday, which is unusual for patient), hematochezia, melena and vomiting.    Genitourinary: Negative for dysuria.   All other systems are reviewed and are negative for acute change except as noted in the HPI  Physical Exam Vital Signs  I have reviewed the triage vital signs BP (!) 146/84 (BP Location: Right Arm)   Pulse 89   Temp 97.7 F (36.5 C) (Oral)   Resp 18   Ht 5\' 9"  (1.753 m)   Wt 85.3 kg (188 lb)   SpO2 100%   BMI 27.76 kg/m   Physical Exam  Constitutional: He is oriented to person, place, and time. He appears well-developed and well-nourished. No distress.  HENT:  Head: Normocephalic and atraumatic.  Nose: Nose normal.  Eyes: Conjunctivae and EOM are normal. Pupils are equal, round, and reactive to light. Right eye exhibits no discharge. Left eye exhibits no discharge. No scleral icterus.  Neck: Normal range of motion. Neck supple.  Cardiovascular: Normal rate and regular rhythm. Exam reveals no gallop and no friction rub.  No murmur heard. Pulmonary/Chest: Effort normal and breath sounds normal. No stridor. No respiratory distress. He has no rales.  Abdominal: Soft. He exhibits distension. There is generalized tenderness. There is no rigidity, no rebound and no guarding.  Hyperechoic BS  Musculoskeletal: He exhibits no edema or tenderness.  Neurological: He is alert and oriented to person, place, and time.  Skin: Skin is warm and dry. No rash noted. He is not diaphoretic. No erythema.  Psychiatric: He has a normal mood and affect.  Vitals reviewed.   ED Results and Treatments Labs (all labs ordered are listed, but only abnormal results are displayed) Labs Reviewed  CBC WITH DIFFERENTIAL/PLATELET - Abnormal; Notable for the following components:      Result Value   Platelets 146 (*)    All other components within normal limits  COMPREHENSIVE METABOLIC PANEL - Abnormal; Notable for the following components:   Glucose, Bld 151 (*)    BUN 27 (*)    Creatinine, Ser 2.12 (*)    GFR calc non Af Amer 30 (*)    GFR calc Af Amer 35 (*)     All other components within normal limits  URINALYSIS, ROUTINE W REFLEX MICROSCOPIC - Abnormal; Notable for the following components:   Protein, ur 100 (*)    All other components within normal limits  URINALYSIS, MICROSCOPIC (REFLEX) - Abnormal; Notable for the following components:   Bacteria, UA RARE (*)    Squamous Epithelial / LPF 0-5 (*)    All other components within normal limits  LIPASE, BLOOD  EKG  EKG Interpretation  Date/Time:    Ventricular Rate:    PR Interval:    QRS Duration:   QT Interval:    QTC Calculation:   R Axis:     Text Interpretation:        Radiology Ct Abdomen Pelvis Wo Contrast  Addendum Date: 09/28/2017   ADDENDUM REPORT: 09/28/2017 12:34 ADDENDUM: A dilated loop of distal small bowel present in the mid abdomen. This measures up to 9.1 cm in transverse diameter. More distal small bowel and colon are collapsed. This represents a focal distal small bowel obstruction without mass. Given that the dilated bowel extends anterior to the colon, internal hernia should be considered. These results were discussed by telephone on 09/28/2017 at 12:33 pm with Dr. Addison Lank . Electronically Signed   By: San Morelle M.D.   On: 09/28/2017 12:34   Result Date: 09/28/2017 CLINICAL DATA:  Abdominal pain and distention. Nausea beginning at 9 a.m. yesterday. EXAM: CT ABDOMEN AND PELVIS WITHOUT CONTRAST TECHNIQUE: Multidetector CT imaging of the abdomen and pelvis was performed following the standard protocol without IV contrast. COMPARISON:  CT of the abdomen 02/14/2015. MRI of the lumbar spine 07/03/2017. CT of the abdomen and pelvis 03/09/2013 FINDINGS: Lower chest: The lung bases are clear without focal nodule, mass, or airspace disease. The a the heart size is normal. A small hiatal hernia is present. Hepatobiliary: No focal liver abnormality is  seen. No gallstones, gallbladder wall thickening, or biliary dilatation. Pancreas: Atrophy is stable.  No discrete mass lesion is present. Spleen: Normal in size without focal abnormality. Adrenals/Urinary Tract: Adrenal glands are normal bilaterally. Multiple bilateral renal cysts are stable. There is a complex cyst at the lower pole of the left kidney which measures 2.7 x 2.2 x 2.9 cm. This lesion is slightly more prominent than on prior studies. Recommend follow-up MRI with and without contrast further evaluation. Given the patient's renal insufficiency, a partial dose of MultiHance could be administered. Stomach/Bowel: Stomach and duodenum are within normal limits. Small bowel is unremarkable. The terminal ileum is within normal limits. The ascending and transverse colon are mostly collapsed. The descending and sigmoid colon are normal. Vascular/Lymphatic: Dense atherosclerotic calcifications are present within the aorta and branch vessels without aneurysm. Reproductive: Prostate is unremarkable. Other: Fat herniates into the inguinal canals bilaterally. There is no associated bowel. A small umbilical hernia contains fat. There is no herniation of bowel. No obstruction is present. No free fluid or free air is present. Musculoskeletal: S-shaped curvature of the lumbar spine is convex to the right at L2 and to the left at L4-5 associated endplate changes are present. AP alignment is anatomic. No focal lytic or blastic lesions are present. Degenerative changes are noted in the sacroiliac joints bilaterally. Pelvis is otherwise within normal limits. The hips are located. IMPRESSION: 1. No acute or focal lesion to account for the patient's symptoms of abdominal pain, distention, or nausea. 2. Complex cyst at the lower pole of the left kidney demonstrates some interval growth since 2014. Recommend follow-up MRI of the abdomen without and with contrast for further evaluation. Given the patient's renal insufficiency, a  partial dose of MultiHance could be administered. 3. Fat herniates into the inguinal canals bilaterally without associated bowel. 4. 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 Electronically Signed: By: San Morelle M.D. On: 09/28/2017 10:46   Pertinent labs & imaging results that were available during my care of the patient were reviewed by me and considered in  my medical decision making (see chart for details).  Medications Ordered in ED Medications  sodium chloride 0.9 % bolus 1,000 mL (0 mLs Intravenous Stopped 09/28/17 1011)                                                                                                                                    Procedures Procedures  (including critical care time)  Medical Decision Making / ED Course I have reviewed the nursing notes for this encounter and the patient's prior records (if available in EHR or on provided paperwork).    Workup consistent with small bowel obstruction confirmed with CT scan.  NG tube in place.  Patient is well-appearing, afebrile with stable vital signs.  Labs with known renal insufficiency otherwise grossly reassuring.  Case was discussed with Dr. Marlou Starks who recommended medicine admission and they will follow along.  Will discuss case with medicine for admission and further management.  Final Clinical Impression(s) / ED Diagnoses Final diagnoses:  SBO (small bowel obstruction) ([place])    This chart was dictated using voice recognition software.  Despite best efforts to proofread,  errors can occur which can change the documentation meaning.   Fatima Blank, MD 09/28/17 1240

## 2017-09-28 NOTE — H&P (Signed)
History and Physical    JOS CYGAN WJX:[id number] DOB: Nov 19, 1945 DOA: 09/28/2017  PCP: Delilah Shan, MD   I have briefly reviewed patients previous medical reports in [facility].  Patient coming from: Home  Chief Complaint: abd pain and nausea.   HPI: Alex Mccarty is a 72 y/o with PMH significant for type 2 diabetes with nephropathy, HTN, HLD and GERD; who presented to ED complaining of abd pain and nausea. Patient reproted symptoms present for the last 48 hours and worsening. Diffuse in location, but mainly affecting mid abdomen; no moving slightly improve pain; patient reported intensity 8-9/10 at its worse and reported associated nausea, anorexia and general malaise.  Patient denies CP, SOB, dysuria, hematuria, melena, hematochezia and HA's.   Of note, last BM over 24 hours ago and reported no flatus.   ED Course: patient received IVF's and IV antiemetics. CT abd/pelvis demonstrated SBO. NGT placed and CCS consulted. [facility] contacted to admit patient for further evaluation and management.  Review of Systems:  All other systems reviewed and apart from HPI, are negative.  Past Medical History:  Diagnosis Date  . CKD (chronic kidney disease)    unkknown stage per patient  . Diabetes mellitus ([place])   . Hyperlipidemia   . Hypertension   . Kidney stones     Past Surgical History:  Procedure Laterality Date  . ELBOW SURGERY     ulnar nerve repair  . FOOT SURGERY     crushed heel    Social History  reports that he has quit smoking. he has never used smokeless tobacco. He reports that he drinks alcohol. He reports that he does not use drugs.  Allergies  Allergen Reactions  . Codeine Nausea And Vomiting  . Penicillins Other (See Comments)    Childhood reaction Has patient had a PCN reaction causing immediate rash, facial/tongue/throat swelling, SOB or lightheadedness with hypotension: Unknown Has patient had a PCN reaction causing severe rash involving  mucus membranes or skin necrosis: Unknown Has patient had a PCN reaction that required hospitalization: Unknown Has patient had a PCN reaction occurring within the last 10 years: Unknown If all of the above answers are "NO", then may proceed with Cephalosporin use.    . Sulfa Antibiotics Nausea And Vomiting    Family History  Problem Relation Age of Onset  . Kidney Stones Mother   . Heart disease Mother      Prior to Admission medications   Medication Sig Start Date End Date Taking? Authorizing Provider  ASPIRIN LOW DOSE 81 MG EC tablet Take 81 mg by mouth at bedtime.  08/28/14  Yes [provider]  atorvastatin (LIPITOR) 80 MG tablet Take 80 mg by mouth at bedtime.  02/23/14  Yes [provider]  cetirizine (ZYRTEC) 10 MG tablet Take 10 mg by mouth daily as needed.    Yes [provider]  Cholecalciferol (VITAMIN D) 2000 units tablet Take 2,000 Units by mouth at bedtime.   Yes [provider]  cyclobenzaprine (FLEXERIL) 10 MG tablet Take 10 mg by mouth 3 (three) times daily as needed (muscle pain).   Yes [provider]  gabapentin (NEURONTIN) 300 MG capsule Take 300 mg by mouth at bedtime.   Yes [provider]  Glucosamine-Chondroitin (COSAMIN DS PO) Take 1 capsule by mouth at bedtime.   Yes [provider]  LANTUS SOLOSTAR 100 UNIT/ML Solostar Pen Inject 40 Units into the skin every morning.  06/02/14  Yes [provider]  losartan (COZAAR) 100 MG tablet Take 100 mg by mouth daily.  08/08/14  Yes [provider]  meclizine (ANTIVERT) 25 MG tablet Take 1 tablet (25 mg total) by mouth 3 (three) times daily as needed for dizziness. Patient taking differently: Take 25 mg by mouth 3 (three) times daily as needed for dizziness. dizziness 09/09/14  Yes Melvenia Beam, MD  potassium citrate (UROCIT-K) 10 MEQ (1080 MG) SR tablet Take 15 mEq by mouth daily before breakfast.    Yes [provider]    propranolol (INDERAL) 10 MG tablet Take 10 mg by mouth daily.   Yes [provider]  spironolactone (ALDACTONE) 25 MG tablet Take 12.5 mg by mouth daily.   Yes [provider]  tiZANidine (ZANAFLEX) 4 MG tablet Take 4 mg by mouth every 6 (six) hours as needed for muscle spasms.   Yes [provider]    Physical Exam: Vitals:   09/28/17 1530 09/28/17 1532 09/28/17 1534 09/28/17 1650  BP:  134/87 134/87 (!) 164/85  Pulse: 79 79 80 85  Resp:   18 18  Temp:   98 F (36.7 C) 98.6 F (37 C)  TempSrc:   Oral Oral  SpO2: 99% 99% 99% 98%  Weight:      Height:        Constitutional: afebrile, no CP, no SOB. Complaining of abd pain and nausea; NGT in place and mild fluid collection appreciated inside canister. Eyes: PERTLA, lids and conjunctivae normal, no nystagmus, no icterus ENMT: Mucous membranes slightly dry on exam, Posterior pharynx clear of any exudate or lesions. NGT in place. Neck: supple, no masses, no thyromegaly Respiratory: clear to auscultation bilaterally, no wheezing, no crackles. Normal respiratory effort. No accessory muscle use.  Cardiovascular: S1 & S2 heard, regular rate and rhythm, no murmurs / rubs / gallops. No JVD. No carotid bruits.  Abdomen: positive distension, mild epigastric and mid abd pain on palpation, hypoactive BS; deneis flatus and last BM > 24 hours ago.   Musculoskeletal: no clubbing / cyanosis. No joint deformity upper and lower extremities. Good ROM, no contractures. Normal muscle tone.  Skin: no rashes, lesions, ulcers. No induration Neurologic: CN 2-12 grossly intact. Sensation intact, DTR normal. Strength 5/5 in all 4 limbs.  Psychiatric: Normal judgment and insight. Alert and oriented x 3. Normal mood.    Labs on Admission: I have personally reviewed following labs and imaging studies  CBC: Recent Labs  Lab 09/28/17 0811  WBC 7.2  NEUTROABS 4.8  HGB 15.0  HCT 44.9  MCV 92.8  PLT 510*   Basic Metabolic  Panel: Recent Labs  Lab 09/28/17 0811  NA 138  K 4.0  CL 107  CO2 22  GLUCOSE 151*  BUN 27*  CREATININE 2.12*  CALCIUM 9.4   Liver Function Tests: Recent Labs  Lab 09/28/17 0811  AST 17  ALT 23  ALKPHOS 65  BILITOT 1.1  PROT 7.5  ALBUMIN 3.8   Urine analysis:    Component Value Date/Time   COLORURINE YELLOW 09/28/2017 [place] 09/28/2017 0819   LABSPEC 1.020 09/28/2017 0819   PHURINE 6.0 09/28/2017 [place] 09/28/2017 0819   HGBUR NEGATIVE 09/28/2017 [place] 09/28/2017 0819   KETONESUR NEGATIVE 09/28/2017 0819   PROTEINUR 100 (A) 09/28/2017 0819   UROBILINOGEN 0.2 03/09/2013 1157   NITRITE NEGATIVE 09/28/2017 0819   LEUKOCYTESUR NEGATIVE 09/28/2017 0819    Radiological Exams on Admission: Ct Abdomen Pelvis Wo  Contrast  Addendum Date: 09/28/2017   ADDENDUM REPORT: 09/28/2017 12:34 ADDENDUM: A dilated loop of distal small bowel present in the mid abdomen. This measures up to 9.1 cm in transverse diameter. More distal small bowel and colon are collapsed. This represents a focal distal small bowel obstruction without mass. Given that the dilated bowel extends anterior to the colon, internal hernia should be considered. These results were discussed by telephone on 09/28/2017 at 12:33 pm with Dr. Addison Lank . Electronically Signed   By: San Morelle M.D.   On: 09/28/2017 12:34   Result Date: 09/28/2017 CLINICAL DATA:  Abdominal pain and distention. Nausea beginning at 9 a.m. yesterday. EXAM: CT ABDOMEN AND PELVIS WITHOUT CONTRAST TECHNIQUE: Multidetector CT imaging of the abdomen and pelvis was performed following the standard protocol without IV contrast. COMPARISON:  CT of the abdomen 02/14/2015. MRI of the lumbar spine 07/03/2017. CT of the abdomen and pelvis 03/09/2013 FINDINGS: Lower chest: The lung bases are clear without focal nodule, mass, or airspace disease. The a the heart size is normal. A small hiatal  hernia is present. Hepatobiliary: No focal liver abnormality is seen. No gallstones, gallbladder wall thickening, or biliary dilatation. Pancreas: Atrophy is stable.  No discrete mass lesion is present. Spleen: Normal in size without focal abnormality. Adrenals/Urinary Tract: Adrenal glands are normal bilaterally. Multiple bilateral renal cysts are stable. There is a complex cyst at the lower pole of the left kidney which measures 2.7 x 2.2 x 2.9 cm. This lesion is slightly more prominent than on prior studies. Recommend follow-up MRI with and without contrast further evaluation. Given the patient's renal insufficiency, a partial dose of MultiHance could be administered. Stomach/Bowel: Stomach and duodenum are within normal limits. Small bowel is unremarkable. The terminal ileum is within normal limits. The ascending and transverse colon are mostly collapsed. The descending and sigmoid colon are normal. Vascular/Lymphatic: Dense atherosclerotic calcifications are present within the aorta and branch vessels without aneurysm. Reproductive: Prostate is unremarkable. Other: Fat herniates into the inguinal canals bilaterally. There is no associated bowel. A small umbilical hernia contains fat. There is no herniation of bowel. No obstruction is present. No free fluid or free air is present. Musculoskeletal: S-shaped curvature of the lumbar spine is convex to the right at L2 and to the left at L4-5 associated endplate changes are present. AP alignment is anatomic. No focal lytic or blastic lesions are present. Degenerative changes are noted in the sacroiliac joints bilaterally. Pelvis is otherwise within normal limits. The hips are located. IMPRESSION: 1. No acute or focal lesion to account for the patient's symptoms of abdominal pain, distention, or nausea. 2. Complex cyst at the lower pole of the left kidney demonstrates some interval growth since 2014. Recommend follow-up MRI of the abdomen without and with contrast for  further evaluation. Given the patient's renal insufficiency, a partial dose of MultiHance could be administered. 3. Fat herniates into the inguinal canals bilaterally without associated bowel. 4. 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 Electronically Signed: By: San Morelle M.D. On: 09/28/2017 10:46   Dg Abd Portable 1 View  Result Date: 09/28/2017 CLINICAL DATA:  NG tube placement.  Bowel obstruction. EXAM: PORTABLE ABDOMEN - 1 VIEW COMPARISON:  One-view abdomen from the same day at 12:52 p.m. FINDINGS: The NG tube has been advanced. The side port is now in the stomach. The lung bases are clear. The distended loop of distal small bowel is again noted. The stomach is decompressed. There is no free  air. Degenerative changes are again noted in the lumbar spine. IMPRESSION: 1. The side port of the NG tube is now in the stomach with decompression of the stomach. 2. Dilated loop of distal small bowel is unchanged. Electronically Signed   By: San Morelle M.D.   On: 09/28/2017 15:27   Dg Abd Portable 1v  Result Date: 09/28/2017 CLINICAL DATA:  NG tube placement. EXAM: PORTABLE ABDOMEN - 1 VIEW COMPARISON:  CT 09/28/2017. FINDINGS: NG tube noted with tip below left hemidiaphragm. Side hole is at the gastroesophageal junction. Advancement of approximately 10 cm should be considered. Air-filled loops of small bowel containing oral contrast from prior CT noted. Follow-up abdominal series suggested for continued evaluation. No acute bony abnormality. Mild lumbar spine scoliosis. IMPRESSION: 1. NG tube noted with tip below left hemidiaphragm. Side-hole is a gastroesophageal junction. Advancement of approximately 10 cm should be considered. 2. Air-filled loops of small bowel containing oral contrast noted from prior CT. Follow-up abdominal series suggested for continued evaluation. Electronically Signed   By: Marcello Moores  Register   On: 09/28/2017 13:08    EKG:  None    Assessment/Plan 1-SBO (small bowel obstruction) (HCC) -conservative management to start -NGT inserted, low intermittent suction  -PRN analgesics and antiemetics -will check Mg and K level; aiming for Mg > and K > 4 -IVF's and follow abd x-ray in am   2-Benign paroxysmal positional vertigo -stable -holding oral meds currently -will resume meclizine when tolerating PO's  3-HTN (hypertension) -holding oral antihypertensive regimen  -will use hydralazine IV as needed  -BP is well controlled currently   4-HLD (hyperlipidemia) -holding statins while NPO   5-Type 2 diabetes mellitus with nephropathy ([place]) -patient is NPO -will use adjusted dose of lantus and SSI -follow A1C  6-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  Consults called: CCS  Admission status: inpatient, med-surg bed; LOS > 2 midnights     Barton Dubois MD [facility] Pager [phone]  If 7PM-7AM, please contact night-coverage [email or web address] Password [email or web address]  09/28/2017, 7:04 PM

## 2017-09-28 NOTE — ED Notes (Signed)
Report given the [place], RN with Karen Chafe

## 2017-09-28 NOTE — ED Notes (Signed)
Patient transported to CT 

## 2017-09-29 ENCOUNTER — Encounter (HOSPITAL_COMMUNITY): Admission: EM | Disposition: A | Discharge status: Home / Self Care | Payer: Self-pay | Source: Home / Self Care

## 2017-09-29 ENCOUNTER — Inpatient Hospital Stay (HOSPITAL_COMMUNITY): Payer: PPO | Admitting: Anesthesiology

## 2017-09-29 ENCOUNTER — Inpatient Hospital Stay (HOSPITAL_COMMUNITY): Payer: PPO

## 2017-09-29 ENCOUNTER — Encounter (HOSPITAL_COMMUNITY): Payer: Self-pay

## 2017-09-29 DIAGNOSIS — I159 Secondary hypertension, unspecified: Secondary | ICD-10-CM

## 2017-09-29 DIAGNOSIS — I129 Hypertensive chronic kidney disease with stage 1 through stage 4 chronic kidney disease, or unspecified chronic kidney disease: Secondary | ICD-10-CM | POA: Diagnosis not present

## 2017-09-29 DIAGNOSIS — K562 Volvulus: Secondary | ICD-10-CM | POA: Diagnosis not present

## 2017-09-29 DIAGNOSIS — K56609 Unspecified intestinal obstruction, unspecified as to partial versus complete obstruction: Secondary | ICD-10-CM | POA: Diagnosis not present

## 2017-09-29 DIAGNOSIS — N183 Chronic kidney disease, stage 3 (moderate): Secondary | ICD-10-CM | POA: Diagnosis not present

## 2017-09-29 HISTORY — PX: LAPAROTOMY: SHX154

## 2017-09-29 HISTORY — PX: COLOSTOMY REVISION: SHX5232

## 2017-09-29 LAB — BASIC METABOLIC PANEL
Anion gap: 4 — ABNORMAL LOW (ref 5–15)
BUN: 23 mg/dL — ABNORMAL HIGH (ref 6–20)
CO2: 25 mmol/L (ref 22–32)
Calcium: 8.9 mg/dL (ref 8.9–10.3)
Chloride: 112 mmol/L — ABNORMAL HIGH (ref 101–111)
Creatinine, Ser: 1.91 mg/dL — ABNORMAL HIGH (ref 0.61–1.24)
GFR calc Af Amer: 40 mL/min — ABNORMAL LOW (ref >60)
GFR calc non Af Amer: 34 mL/min — ABNORMAL LOW (ref >60)
Glucose, Bld: 83 mg/dL (ref 65–99)
Potassium: 4.3 mmol/L (ref 3.5–5.1)
Sodium: 141 mmol/L (ref 135–145)

## 2017-09-29 LAB — GLUCOSE, CAPILLARY
Glucose-Capillary: 89 mg/dL (ref 65–99)
Glucose-Capillary: 75 mg/dL (ref 65–99)
Glucose-Capillary: 76 mg/dL (ref 65–99)
Glucose-Capillary: 100 mg/dL — ABNORMAL HIGH (ref 65–99)
Glucose-Capillary: 153 mg/dL — ABNORMAL HIGH (ref 65–99)

## 2017-09-29 LAB — CBC
HCT: 41.3 % (ref 39.0–52.0)
Hemoglobin: 13.4 g/dL (ref 13.0–17.0)
MCH: 30.5 pg (ref 26.0–34.0)
MCHC: 32.4 g/dL (ref 30.0–36.0)
MCV: 94.1 fL (ref 78.0–100.0)
Platelets: 130 10*3/uL — ABNORMAL LOW (ref 150–400)
RBC: 4.39 MIL/uL (ref 4.22–5.81)
RDW: 14 % (ref 11.5–15.5)
WBC: 5.8 10*3/uL (ref 4.0–10.5)

## 2017-09-29 LAB — SURGICAL PCR SCREEN
MRSA, PCR: NEGATIVE
Staphylococcus aureus: NEGATIVE

## 2017-09-29 SURGERY — LAPAROTOMY, EXPLORATORY
Anesthesia: General | Laterality: Right

## 2017-09-29 MED ORDER — LACTATED RINGERS IV SOLN
INTRAVENOUS | Status: DC | PRN
  Administered 2017-09-29: 15:00:00 via INTRAVENOUS

## 2017-09-29 MED ORDER — LIDOCAINE 2% (20 MG/ML) 5 ML SYRINGE
INTRAMUSCULAR | Status: AC
  Filled 2017-09-29: qty 5

## 2017-09-29 MED ORDER — DEXAMETHASONE SODIUM PHOSPHATE 10 MG/ML IJ SOLN
INTRAMUSCULAR | Status: AC
  Filled 2017-09-29: qty 1

## 2017-09-29 MED ORDER — 0.9 % SODIUM CHLORIDE (POUR BTL) OPTIME
TOPICAL | Status: DC | PRN
  Administered 2017-09-29: 1000 mL
  Administered 2017-09-29: 2000 mL

## 2017-09-29 MED ORDER — PROMETHAZINE HCL 25 MG/ML IJ SOLN
6.2500 mg | INTRAMUSCULAR | Status: DC | PRN
  Administered 2017-09-29: 6.25 mg via INTRAVENOUS

## 2017-09-29 MED ORDER — CEFOTETAN DISODIUM-DEXTROSE 2-2.08 GM-%(50ML) IV SOLR
INTRAVENOUS | Status: AC
  Filled 2017-09-29: qty 50

## 2017-09-29 MED ORDER — SCOPOLAMINE 1 MG/3DAYS TD PT72
1.0000 | MEDICATED_PATCH | TRANSDERMAL | Status: DC
  Administered 2017-09-29: 1 via TRANSDERMAL
  Filled 2017-09-29: qty 1

## 2017-09-29 MED ORDER — FENTANYL CITRATE (PF) 250 MCG/5ML IJ SOLN
INTRAMUSCULAR | Status: AC
Start: 2017-09-29 — End: 2017-09-29
  Filled 2017-09-29: qty 5

## 2017-09-29 MED ORDER — HYDROMORPHONE HCL 1 MG/ML IJ SOLN
INTRAMUSCULAR | Status: AC
  Administered 2017-09-29: 0.25 mg via INTRAVENOUS
  Filled 2017-09-29: qty 1

## 2017-09-29 MED ORDER — HYDROMORPHONE HCL 1 MG/ML IJ SOLN
0.2500 mg | INTRAMUSCULAR | Status: DC | PRN
  Administered 2017-09-29 (×2): 0.25 mg via INTRAVENOUS
  Administered 2017-09-29 (×2): 0.5 mg via INTRAVENOUS

## 2017-09-29 MED ORDER — DEXTROSE 5 % IV SOLN
INTRAVENOUS | Status: DC | PRN
  Administered 2017-09-29: 2 g via INTRAVENOUS

## 2017-09-29 MED ORDER — LABETALOL HCL 5 MG/ML IV SOLN
10.0000 mg | Freq: Once | INTRAVENOUS | Status: AC | PRN
Start: 2017-09-29 — End: 2017-09-29
  Administered 2017-09-29: 10 mg via INTRAVENOUS

## 2017-09-29 MED ORDER — LIDOCAINE HCL (CARDIAC) 20 MG/ML IV SOLN
INTRAVENOUS | Status: DC | PRN
  Administered 2017-09-29: 50 mg via INTRAVENOUS

## 2017-09-29 MED ORDER — DIPHENHYDRAMINE HCL 12.5 MG/5ML PO ELIX: 12.5000 mg | ORAL_SOLUTION | Freq: Four times a day (QID) | ORAL | Status: DC | PRN

## 2017-09-29 MED ORDER — LIDOCAINE 2% (20 MG/ML) 5 ML SYRINGE
INTRAMUSCULAR | Status: DC | PRN
  Administered 2017-09-29: 1.5 mg/kg/h via INTRAVENOUS

## 2017-09-29 MED ORDER — ROCURONIUM BROMIDE 100 MG/10ML IV SOLN
INTRAVENOUS | Status: DC | PRN
  Administered 2017-09-29: 30 mg via INTRAVENOUS

## 2017-09-29 MED ORDER — LIDOCAINE 2% (20 MG/ML) 5 ML SYRINGE
INTRAMUSCULAR | Status: AC
  Filled 2017-09-29: qty 10

## 2017-09-29 MED ORDER — HYDROMORPHONE HCL 1 MG/ML IJ SOLN
INTRAMUSCULAR | Status: AC
  Filled 2017-09-29: qty 1

## 2017-09-29 MED ORDER — KETAMINE HCL 10 MG/ML IJ SOLN
INTRAMUSCULAR | Status: AC
  Filled 2017-09-29: qty 1

## 2017-09-29 MED ORDER — LABETALOL HCL 5 MG/ML IV SOLN
INTRAVENOUS | Status: AC
  Filled 2017-09-29: qty 4

## 2017-09-29 MED ORDER — SODIUM CHLORIDE 0.9% FLUSH: 9.0000 mL | INTRAVENOUS | Status: DC | PRN

## 2017-09-29 MED ORDER — PROPOFOL 10 MG/ML IV BOLUS
INTRAVENOUS | Status: AC
Start: 2017-09-29 — End: 2017-09-29
  Filled 2017-09-29: qty 20

## 2017-09-29 MED ORDER — FENTANYL 40 MCG/ML IV SOLN
INTRAVENOUS | Status: DC
  Administered 2017-09-29: 15 ug via INTRAVENOUS
  Administered 2017-09-29: 1000 ug via INTRAVENOUS
  Administered 2017-09-30 (×2): 90 ug via INTRAVENOUS
  Administered 2017-09-30: 190 ug via INTRAVENOUS
  Administered 2017-09-30: 60 ug via INTRAVENOUS
  Filled 2017-09-29: qty 25

## 2017-09-29 MED ORDER — DEXAMETHASONE SODIUM PHOSPHATE 10 MG/ML IJ SOLN
INTRAMUSCULAR | Status: DC | PRN
  Administered 2017-09-29: 10 mg via INTRAVENOUS

## 2017-09-29 MED ORDER — KETAMINE HCL 10 MG/ML IJ SOLN
INTRAMUSCULAR | Status: DC | PRN
  Administered 2017-09-29: 40 mg via INTRAVENOUS

## 2017-09-29 MED ORDER — ONDANSETRON HCL 4 MG/2ML IJ SOLN
INTRAMUSCULAR | Status: AC
  Filled 2017-09-29: qty 2

## 2017-09-29 MED ORDER — NALOXONE HCL 0.4 MG/ML IJ SOLN: 0.4000 mg | INTRAMUSCULAR | Status: DC | PRN

## 2017-09-29 MED ORDER — SUGAMMADEX SODIUM 200 MG/2ML IV SOLN
INTRAVENOUS | Status: AC
  Filled 2017-09-29: qty 2

## 2017-09-29 MED ORDER — FENTANYL CITRATE (PF) 100 MCG/2ML IJ SOLN
INTRAMUSCULAR | Status: DC | PRN
  Administered 2017-09-29 (×3): 50 ug via INTRAVENOUS
  Administered 2017-09-29: 100 ug via INTRAVENOUS

## 2017-09-29 MED ORDER — ROCURONIUM BROMIDE 50 MG/5ML IV SOSY
PREFILLED_SYRINGE | INTRAVENOUS | Status: AC
  Filled 2017-09-29: qty 5

## 2017-09-29 MED ORDER — ONDANSETRON HCL 4 MG/2ML IJ SOLN
4.0000 mg | Freq: Four times a day (QID) | INTRAMUSCULAR | Status: DC | PRN
  Administered 2017-09-30: 4 mg via INTRAVENOUS

## 2017-09-29 MED ORDER — PROPOFOL 10 MG/ML IV BOLUS
INTRAVENOUS | Status: DC | PRN
  Administered 2017-09-29: 150 mg via INTRAVENOUS

## 2017-09-29 MED ORDER — PROMETHAZINE HCL 25 MG/ML IJ SOLN
INTRAMUSCULAR | Status: AC
  Filled 2017-09-29: qty 1

## 2017-09-29 MED ORDER — SUCCINYLCHOLINE CHLORIDE 20 MG/ML IJ SOLN
INTRAMUSCULAR | Status: DC | PRN
  Administered 2017-09-29: 120 mg via INTRAVENOUS

## 2017-09-29 MED ORDER — ONDANSETRON HCL 4 MG/2ML IJ SOLN
INTRAMUSCULAR | Status: DC | PRN
  Administered 2017-09-29: 4 mg via INTRAVENOUS

## 2017-09-29 MED ORDER — DIPHENHYDRAMINE HCL 50 MG/ML IJ SOLN: 12.5000 mg | Freq: Four times a day (QID) | INTRAMUSCULAR | Status: DC | PRN

## 2017-09-29 MED ORDER — SUGAMMADEX SODIUM 200 MG/2ML IV SOLN
INTRAVENOUS | Status: DC | PRN
  Administered 2017-09-29: 200 mg via INTRAVENOUS

## 2017-09-29 MED ORDER — LACTATED RINGERS IV SOLN
INTRAVENOUS | Status: DC | PRN
  Administered 2017-09-29: 16:00:00 via INTRAVENOUS

## 2017-09-29 SURGICAL SUPPLY — 39 items
APPLICATOR COTTON TIP 6IN STRL (MISCELLANEOUS) ×4 IMPLANT
BLADE EXTENDED COATED 6.5IN (ELECTRODE) IMPLANT
BLADE HEX COATED 2.75 (ELECTRODE) ×4 IMPLANT
COVER MAYO STAND STRL (DRAPES) IMPLANT
COVER SURGICAL LIGHT HANDLE (MISCELLANEOUS) ×8 IMPLANT
DRAPE LAPAROSCOPIC ABDOMINAL (DRAPES) ×4 IMPLANT
DRAPE WARM FLUID 44X44 (DRAPE) IMPLANT
DRSG OPSITE POSTOP 4X8 (GAUZE/BANDAGES/DRESSINGS) ×4 IMPLANT
ELECT PENCIL ROCKER SW 15FT (MISCELLANEOUS) ×4 IMPLANT
ELECT REM PT RETURN 15FT ADLT (MISCELLANEOUS) ×4 IMPLANT
GAUZE SPONGE 4X4 12PLY STRL (GAUZE/BANDAGES/DRESSINGS) ×4 IMPLANT
GLOVE BIO SURGEON STRL SZ7.5 (GLOVE) ×8 IMPLANT
GLOVE BIOGEL PI IND STRL 7.0 (GLOVE) ×2 IMPLANT
GLOVE BIOGEL PI INDICATOR 7.0 (GLOVE) ×2
GOWN STRL REUS W/ TWL XL LVL3 (GOWN DISPOSABLE) ×2 IMPLANT
GOWN STRL REUS W/TWL LRG LVL3 (GOWN DISPOSABLE) ×4 IMPLANT
GOWN STRL REUS W/TWL XL LVL3 (GOWN DISPOSABLE) ×6 IMPLANT
HANDLE SUCTION POOLE (INSTRUMENTS) IMPLANT
KIT BASIN OR (CUSTOM PROCEDURE TRAY) ×4 IMPLANT
LIGASURE IMPACT 36 18CM CVD LR (INSTRUMENTS) ×4 IMPLANT
PACK COLON (CUSTOM PROCEDURE TRAY) ×4 IMPLANT
RELOAD PROXIMATE 75MM BLUE (ENDOMECHANICALS) ×8 IMPLANT
SPONGE LAP 18X18 X RAY DECT (DISPOSABLE) IMPLANT
STAPLER GUN LINEAR PROX 60 (STAPLE) ×4 IMPLANT
STAPLER PROXIMATE 75MM BLUE (STAPLE) ×4 IMPLANT
STAPLER VISISTAT 35W (STAPLE) ×8 IMPLANT
SUCTION POOLE HANDLE (INSTRUMENTS)
SUT PDS AB 1 CTX 36 (SUTURE) IMPLANT
SUT PDS AB 1 TP1 96 (SUTURE) ×8 IMPLANT
SUT SILK 2 0 (SUTURE) ×2
SUT SILK 2 0 SH CR/8 (SUTURE) ×4 IMPLANT
SUT SILK 2 0SH CR/8 30 (SUTURE) ×4 IMPLANT
SUT SILK 2-0 18XBRD TIE 12 (SUTURE) ×2 IMPLANT
SUT SILK 3 0 (SUTURE) ×2
SUT SILK 3 0 SH CR/8 (SUTURE) ×4 IMPLANT
SUT SILK 3-0 18XBRD TIE 12 (SUTURE) ×2 IMPLANT
TOWEL OR 17X26 10 PK STRL BLUE (TOWEL DISPOSABLE) ×4 IMPLANT
TRAY FOLEY W/METER SILVER 16FR (SET/KITS/TRAYS/PACK) ×4 IMPLANT
YANKAUER SUCT BULB TIP NO VENT (SUCTIONS) IMPLANT

## 2017-09-29 NOTE — H&P (View-Only) (Signed)
CC: Abdominal pain nausea and vomiting  Subjective: He feels better this AM, Ng sump filter changed  Objective: Vital signs in last 24 hours: Temp:  [97.7 F (36.5 C)-98.6 F (37 C)] 97.7 F (36.5 C) (01/11 0522) Pulse Rate:  [76-91] 76 (01/11 0522) Resp:  [18] 18 (01/11 0522) BP: (128-166)/(65-87) 145/65 (01/11 0522) SpO2:  [97 %-100 %] 98 % (01/11 0522) Last BM Date: 09/27/17 NPO 833  IV 1600 urine NG 300 recorded 250:  Other ? Afebrile,  VSS Labs OK Film this AM still dialated   Intake/Output from previous day: 01/10 0701 - 01/11 0700 In: 833.3 [I.V.:833.3] Out: 2150 [Urine:1600; Emesis/NG output:300] Intake/Output this shift: No intake/output data recorded.  General appearance: alert, cooperative and no distress Resp: Clear GI: Minimal distention, not very tender on palpation.  No bowel sounds  Lab Results:  Recent Labs    09/28/17 1920 09/29/17 0531  WBC 6.7 5.8  HGB 14.4 13.4  HCT 43.8 41.3  PLT 138* 130*    BMET Recent Labs    09/28/17 0811 09/28/17 1920 09/29/17 0531  NA 138  --  141  K 4.0  --  4.3  CL 107  --  112*  CO2 22  --  25  GLUCOSE 151*  --  83  BUN 27*  --  23*  CREATININE 2.12* 2.09* 1.91*  CALCIUM 9.4  --  8.9   PT/INR No results for input(s): LABPROT, INR in the last 72 hours.  Recent Labs  Lab 09/28/17 0811  AST 17  ALT 23  ALKPHOS 65  BILITOT 1.1  PROT 7.5  ALBUMIN 3.8     Lipase     Component Value Date/Time   LIPASE 35 09/28/2017 0812   Prior to Admission medications   Medication Sig Start Date End Date Taking? Authorizing Provider  ASPIRIN LOW DOSE 81 MG EC tablet Take 81 mg by mouth at bedtime.  08/28/14  Yes [provider]  atorvastatin (LIPITOR) 80 MG tablet Take 80 mg by mouth at bedtime.  02/23/14  Yes [provider]  cetirizine (ZYRTEC) 10 MG tablet Take 10 mg by mouth daily as needed.    Yes [provider]  Cholecalciferol (VITAMIN D) 2000 units tablet Take  2,000 Units by mouth at bedtime.   Yes [provider]  cyclobenzaprine (FLEXERIL) 10 MG tablet Take 10 mg by mouth 3 (three) times daily as needed (muscle pain).   Yes [provider]  gabapentin (NEURONTIN) 300 MG capsule Take 300 mg by mouth at bedtime.   Yes [provider]  Glucosamine-Chondroitin (COSAMIN DS PO) Take 1 capsule by mouth at bedtime.   Yes [provider]  LANTUS SOLOSTAR 100 UNIT/ML Solostar Pen Inject 40 Units into the skin every morning.  06/02/14  Yes [provider]  losartan (COZAAR) 100 MG tablet Take 100 mg by mouth daily.  08/08/14  Yes [provider]  meclizine (ANTIVERT) 25 MG tablet Take 1 tablet (25 mg total) by mouth 3 (three) times daily as needed for dizziness. Patient taking differently: Take 25 mg by mouth 3 (three) times daily as needed for dizziness. dizziness 09/09/14  Yes Melvenia Beam, MD  potassium citrate (UROCIT-K) 10 MEQ (1080 MG) SR tablet Take 15 mEq by mouth daily before breakfast.    Yes [provider]  propranolol (INDERAL) 10 MG tablet Take 10 mg by mouth daily.   Yes [provider]  spironolactone (ALDACTONE) 25 MG tablet Take 12.5  mg by mouth daily.   Yes [provider]  tiZANidine (ZANAFLEX) 4 MG tablet Take 4 mg by mouth every 6 (six) hours as needed for muscle spasms.   Yes [provider]      Medications: . chlorhexidine  15 mL Mouth Rinse BID  . heparin  5,000 Units Subcutaneous Q8H  . insulin aspart  0-9 Units Subcutaneous TID WC  . insulin glargine  15 Units Subcutaneous QHS  . mouth rinse  15 mL Mouth Rinse q12n4p  . pantoprazole (PROTONIX) IV  40 mg Intravenous QPC supper   . sodium chloride 100 mL/hr (09/28/17 2140)   Anti-infectives (From admission, onward)   None      Assessment/Plan SBO  Type 2 diabetes on insulin Hypertension Chronic kidney disease -baseline creatinine 2.09.  FEN:  IV fluids/NPO ID:  None DVT:   Heparin Foley:  None Follow up:  TBD  Plan:  Will check on starting SB Protocol with Dr. Marlou Starks.  Radiology has completed the films and reviewed it.  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  Discussed with Dr. Marlou Starks will call GI as them for evaluation.  LOS: 1 day    Dionte Blaustein 09/29/2017 [phone]

## 2017-09-29 NOTE — Interval H&P Note (Signed)
History and Physical Interval Note:  09/29/2017 2:48 PM  Ludwig Lean  has presented today for surgery, with the diagnosis of Cecal Volvulus  The various methods of treatment have been discussed with the patient and family. After consideration of risks, benefits and other options for treatment, the patient has consented to  Procedure(s): EXPLORATORY LAPAROTOMY (N/A) COLON RESECTION RIGHT (Right) as a surgical intervention .  The patient's history has been reviewed, patient examined, no change in status, stable for surgery.  I have reviewed the patient's chart and labs.  Questions were answered to the patient's satisfaction.     TOTH III,PAUL S

## 2017-09-29 NOTE — Anesthesia Procedure Notes (Signed)
Procedure Name: Intubation Date/Time: 09/29/2017 3:34 PM Performed by: Glory Buff, CRNA Pre-anesthesia Checklist: Patient identified, Emergency Drugs available, Suction available and Patient being monitored Patient Re-evaluated:Patient Re-evaluated prior to induction Oxygen Delivery Method: Circle system utilized Preoxygenation: Pre-oxygenation with 100% oxygen Induction Type: IV induction Ventilation: Mask ventilation without difficulty Laryngoscope Size: Miller and 3 Grade View: Grade II Tube type: Oral Tube size: 7.5 mm Number of attempts: 1 Airway Equipment and Method: Oral airway and Bougie stylet Placement Confirmation: ETT inserted through vocal cords under direct vision,  positive ETCO2 and breath sounds checked- equal and bilateral Secured at: 21 cm Tube secured with: Tape Dental Injury: Teeth and Oropharynx as per pre-operative assessment

## 2017-09-29 NOTE — Transfer of Care (Signed)
Immediate Anesthesia Transfer of Care Note  Patient: Alex Mccarty  Procedure(s) Performed: EXPLORATORY LAPAROTOMY (N/A ) COLON RESECTION RIGHT (Right )  Patient Location: PACU  Anesthesia Type:General  Level of Consciousness: sedated  Airway & Oxygen Therapy: Patient Spontanous Breathing and Patient connected to face mask oxygen  Post-op Assessment: Report given to RN and Post -op Vital signs reviewed and stable  Post vital signs: Reviewed and stable  Last Vitals:  Vitals:   09/29/17 0522 09/29/17 1400  BP: (!) 145/65 (!) 159/79  Pulse: 76 82  Resp: 18 17  Temp: 36.5 C 36.6 C  SpO2: 98% 98%    Last Pain:  Vitals:   09/29/17 1430  TempSrc:   PainSc: 0-No pain      Patients Stated Pain Goal: 3 (46/95/07 2257)  Complications: No apparent anesthesia complications

## 2017-09-29 NOTE — Progress Notes (Signed)
CC: Abdominal pain nausea and vomiting  Subjective: He feels better this AM, Ng sump filter changed  Objective: Vital signs in last 24 hours: Temp:  [97.7 F (36.5 C)-98.6 F (37 C)] 97.7 F (36.5 C) (01/11 0522) Pulse Rate:  [76-91] 76 (01/11 0522) Resp:  [18] 18 (01/11 0522) BP: (128-166)/(65-87) 145/65 (01/11 0522) SpO2:  [97 %-100 %] 98 % (01/11 0522) Last BM Date: 09/27/17 NPO 833  IV 1600 urine NG 300 recorded 250:  Other ? Afebrile,  VSS Labs OK Film this AM still dialated   Intake/Output from previous day: 01/10 0701 - 01/11 0700 In: 833.3 [I.V.:833.3] Out: 2150 [Urine:1600; Emesis/NG output:300] Intake/Output this shift: No intake/output data recorded.  General appearance: alert, cooperative and no distress Resp: Clear GI: Minimal distention, not very tender on palpation.  No bowel sounds  Lab Results:  Recent Labs    09/28/17 1920 09/29/17 0531  WBC 6.7 5.8  HGB 14.4 13.4  HCT 43.8 41.3  PLT 138* 130*    BMET Recent Labs    09/28/17 0811 09/28/17 1920 09/29/17 0531  NA 138  --  141  K 4.0  --  4.3  CL 107  --  112*  CO2 22  --  25  GLUCOSE 151*  --  83  BUN 27*  --  23*  CREATININE 2.12* 2.09* 1.91*  CALCIUM 9.4  --  8.9   PT/INR No results for input(s): LABPROT, INR in the last 72 hours.  Recent Labs  Lab 09/28/17 0811  AST 17  ALT 23  ALKPHOS 65  BILITOT 1.1  PROT 7.5  ALBUMIN 3.8     Lipase     Component Value Date/Time   LIPASE 35 09/28/2017 0812   Prior to Admission medications   Medication Sig Start Date End Date Taking? Authorizing Provider  ASPIRIN LOW DOSE 81 MG EC tablet Take 81 mg by mouth at bedtime.  08/28/14  Yes [provider]  atorvastatin (LIPITOR) 80 MG tablet Take 80 mg by mouth at bedtime.  02/23/14  Yes [provider]  cetirizine (ZYRTEC) 10 MG tablet Take 10 mg by mouth daily as needed.    Yes [provider]  Cholecalciferol (VITAMIN D) 2000 units tablet Take  2,000 Units by mouth at bedtime.   Yes [provider]  cyclobenzaprine (FLEXERIL) 10 MG tablet Take 10 mg by mouth 3 (three) times daily as needed (muscle pain).   Yes [provider]  gabapentin (NEURONTIN) 300 MG capsule Take 300 mg by mouth at bedtime.   Yes [provider]  Glucosamine-Chondroitin (COSAMIN DS PO) Take 1 capsule by mouth at bedtime.   Yes [provider]  LANTUS SOLOSTAR 100 UNIT/ML Solostar Pen Inject 40 Units into the skin every morning.  06/02/14  Yes [provider]  losartan (COZAAR) 100 MG tablet Take 100 mg by mouth daily.  08/08/14  Yes [provider]  meclizine (ANTIVERT) 25 MG tablet Take 1 tablet (25 mg total) by mouth 3 (three) times daily as needed for dizziness. Patient taking differently: Take 25 mg by mouth 3 (three) times daily as needed for dizziness. dizziness 09/09/14  Yes Melvenia Beam, MD  potassium citrate (UROCIT-K) 10 MEQ (1080 MG) SR tablet Take 15 mEq by mouth daily before breakfast.    Yes [provider]  propranolol (INDERAL) 10 MG tablet Take 10 mg by mouth daily.   Yes [provider]  spironolactone (ALDACTONE) 25 MG tablet Take 12.5  mg by mouth daily.   Yes [provider]  tiZANidine (ZANAFLEX) 4 MG tablet Take 4 mg by mouth every 6 (six) hours as needed for muscle spasms.   Yes [provider]      Medications: . chlorhexidine  15 mL Mouth Rinse BID  . heparin  5,000 Units Subcutaneous Q8H  . insulin aspart  0-9 Units Subcutaneous TID WC  . insulin glargine  15 Units Subcutaneous QHS  . mouth rinse  15 mL Mouth Rinse q12n4p  . pantoprazole (PROTONIX) IV  40 mg Intravenous QPC supper   . sodium chloride 100 mL/hr (09/28/17 2140)   Anti-infectives (From admission, onward)   None      Assessment/Plan SBO  Type 2 diabetes on insulin Hypertension Chronic kidney disease -baseline creatinine 2.09.  FEN:  IV fluids/NPO ID:  None DVT:   Heparin Foley:  None Follow up:  TBD  Plan:  Will check on starting SB Protocol with Dr. Marlou Starks.  Radiology has completed the films and reviewed it.  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  Discussed with Dr. Marlou Starks will call GI as them for evaluation.  LOS: 1 day    Jary Louvier 09/29/2017 [phone]

## 2017-09-29 NOTE — Progress Notes (Signed)
[facility] PROGRESS NOTE  Alex Mccarty HER:[id number] DOB: 06-Nov-1945 DOA: 09/28/2017 PCP: Delilah Shan, MD  Interim summary and HPI 72 y/o with PMH significant for type 2 diabetes with nephropathy, HTN, HLD and GERD; who presented to ED complaining of abd pain and nausea. Patient reproted symptoms present for the last 48 hours and worsening. Diffuse in location, but mainly affecting mid abdomen; no moving slightly improve pain; patient reported intensity 8-9/10 at its worse and reported associated nausea, anorexia and general malaise.  Patient denies CP, SOB, dysuria, hematuria, melena, hematochezia and HA's.   Assessment/Plan: 1-SBO (small bowel obstruction) ([place]) -repeat images with concerns for cecal volvulus  -NGT remains in place  -per [facility] rec's patient will got to surgery later today -continue PRN analgesics and antiemetics -will recommend checking Mg and K level; aiming for Mg > and K > 4 -continue IVF's  2-Benign paroxysmal positional vertigo -stable -holding oral meds currently -resume meclizine when tolerating PO's  3-HTN (hypertension) -holding oral antihypertensive regimen while NPO  -continue hydralazine IV as needed  -BP is well controlled currently   4-HLD (hyperlipidemia) -holding statins while NPO  -resume once diet tolerated   5-Type 2 diabetes mellitus with nephropathy ([place]) -patient is NPO -adjusted dose of lantus and SSI (for the last one no coverage unless CBG's > 200) -A1C 7.0  6-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 Will be available if needed.   Consultants:  General surgery   Procedures:  See below for x-ray reports   Antibiotics:  None   HPI/Subjective: Still with some abd pain; no nausea and denying CP and SOB.   Objective: Vitals:   09/29/17 0522 09/29/17 1400  BP: (!) 145/65 (!) 159/79  Pulse: 76 82  Resp: 18 17  Temp: 97.7 F (36.5 C) 97.9 F (36.6 C)  SpO2: 98% 98%    Intake/Output Summary (Last 24 hours) at 09/29/2017 1635 Last data filed at 09/29/2017 1400 Gross per 24 hour  Intake 1233.33 ml  Output 2300 ml  Net -1066.67 ml   Filed Weights   09/28/17 0811 09/29/17 1430  Weight: 85.3 kg (188 lb) 85.3 kg (188 lb)    Exam:   General:  Afebrile, reports some improvement in his abd pain (even still present), patient denies CP, SOB and is currently w/o any nausea. NGT in place.    Cardiovascular: S1 and S2, no rubs, no gallops  Respiratory: CTA bilaterally   Abdomen: soft, mild tenderness to palpation appreciated, positive BS  Musculoskeletal: no edema, no cyanosis, no clubbing   Data Reviewed: Basic Metabolic Panel: Recent Labs  Lab 09/28/17 0811 09/28/17 1920 09/29/17 0531  NA 138  --  141  K 4.0  --  4.3  CL 107  --  112*  CO2 22  --  25  GLUCOSE 151*  --  83  BUN 27*  --  23*  CREATININE 2.12* 2.09* 1.91*  CALCIUM 9.4  --  8.9  MG  --  1.7  --  PHOS  --  3.4  --    Liver Function Tests: Recent Labs  Lab 09/28/17 0811  AST 17  ALT 23  ALKPHOS 65  BILITOT 1.1  PROT 7.5  ALBUMIN 3.8   Recent Labs  Lab 09/28/17 0812  LIPASE 35   CBC: Recent Labs  Lab 09/28/17 0811 09/28/17 1920 09/29/17 0531  WBC 7.2 6.7 5.8  NEUTROABS 4.8  --   --   HGB 15.0 14.4 13.4  HCT 44.9 43.8 41.3  MCV 92.8 93.8 94.1  PLT 146* 138* 130*   CBG: Recent Labs  Lab 09/29/17 0819  09/29/17 1132 09/29/17 1416  GLUCAP 89 75 76   Studies: Ct Abdomen Pelvis Wo Contrast  Addendum Date: 09/28/2017   ADDENDUM REPORT: 09/28/2017 12:34 ADDENDUM: A dilated loop of distal small bowel present in the mid abdomen. This measures up to 9.1 cm in transverse diameter. More distal small bowel and colon are collapsed. This represents a focal distal small bowel obstruction without mass. Given that the dilated bowel extends anterior to the colon, internal hernia should be considered. These results were discussed by telephone on 09/28/2017 at 12:33 pm with Dr. Addison Lank . Electronically Signed   By: San Morelle M.D.   On: 09/28/2017 12:34   Result Date: 09/28/2017 CLINICAL DATA:  Abdominal pain and distention. Nausea beginning at 9 a.m. yesterday. EXAM: CT ABDOMEN AND PELVIS WITHOUT CONTRAST TECHNIQUE: Multidetector CT imaging of the abdomen and pelvis was performed following the standard protocol without IV contrast. COMPARISON:  CT of the abdomen 02/14/2015. MRI of the lumbar spine 07/03/2017. CT of the abdomen and pelvis 03/09/2013 FINDINGS: Lower chest: The lung bases are clear without focal nodule, mass, or airspace disease. The a the heart size is normal. A small hiatal hernia is present. Hepatobiliary: No focal liver abnormality is seen. No gallstones, gallbladder wall thickening, or biliary dilatation. Pancreas: Atrophy is stable.  No discrete mass lesion is present. Spleen: Normal in size without focal abnormality. Adrenals/Urinary Tract: Adrenal glands are normal bilaterally. Multiple bilateral renal cysts are stable. There is a complex cyst at the lower pole of the left kidney which measures 2.7 x 2.2 x 2.9 cm. This lesion is slightly more prominent than on prior studies. Recommend follow-up MRI with and without contrast further evaluation. Given the patient's renal insufficiency, a partial dose of MultiHance could be administered. Stomach/Bowel: Stomach and duodenum are within  normal limits. Small bowel is unremarkable. The terminal ileum is within normal limits. The ascending and transverse colon are mostly collapsed. The descending and sigmoid colon are normal. Vascular/Lymphatic: Dense atherosclerotic calcifications are present within the aorta and branch vessels without aneurysm. Reproductive: Prostate is unremarkable. Other: Fat herniates into the inguinal canals bilaterally. There is no associated bowel. A small umbilical hernia contains fat. There is no herniation of bowel. No obstruction is present. No free fluid or free air is present. Musculoskeletal: S-shaped curvature of the lumbar spine is convex to the right at L2 and to the left at L4-5 associated endplate changes are present. AP alignment is anatomic. No focal lytic or blastic lesions are present. Degenerative changes are noted in the sacroiliac joints bilaterally. Pelvis is otherwise within normal limits. The hips are located. IMPRESSION: 1. No acute or focal lesion to account for the patient's symptoms of abdominal pain, distention, or nausea. 2. Complex cyst at the lower pole of the left kidney demonstrates some interval growth since 2014. Recommend follow-up MRI of the abdomen without and with contrast for  further evaluation. Given the patient's renal insufficiency, a partial dose of MultiHance could be administered. 3. Fat herniates into the inguinal canals bilaterally without associated bowel. 4. 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 Electronically Signed: By: San Morelle M.D. On: 09/28/2017 10:46   Dg Abd 1 View  Result Date: 09/29/2017 CLINICAL DATA:  History of bowel obstruction. Abdominal pain and distension. EXAM: ABDOMEN - 1 VIEW COMPARISON:  Earlier same day FINDINGS: Nasogastric or orogastric tube tip in the antrum. Dilated intestinal loops remains evident in the central abdomen, now with an air-contrast level. No free air. IMPRESSION: Nasogastric or orogastric tube tip in the  gastric antrum. Electronically Signed   By: Nelson Chimes M.D.   On: 09/29/2017 09:57   Abd 1 View (kub)  Result Date: 09/29/2017 CLINICAL DATA:  Abdominal distention. Follow-up small-bowel obstruction. EXAM: ABDOMEN - 1 VIEW COMPARISON:  09/28/2017; CT abdomen and pelvis-09/28/2017 FINDINGS: Interval removal of enteric tube. Worsening gaseous distension of the cecum, now measuring approximately 11.5 cm in diameter with further extension to the cranial aspect of the abdomen. This finding is associated with a paucity of additional bowel gas. Nondiagnostic evaluation for pneumoperitoneum secondary to supine positioning and exclusion of the lower thorax. No acute osseus abnormalities. Degenerative change of the bilateral hips and lower lumbar spine is suspected though incompletely evaluated. Phleboliths overlie the right hemipelvis. Otherwise, no definitive abnormal intra-abdominal calcifications. IMPRESSION: Interval removal of enteric tube with findings suggestive of worsening cecal volvulus. Critical Value/emergent results were called by telephone at the time of interpretation on 09/29/2017 at 9:34 am to Dr. Barton Dubois , who verbally acknowledged these results. Electronically Signed   By: Sandi Mariscal M.D.   On: 09/29/2017 09:42   Dg Abd Portable 1 View  Result Date: 09/28/2017 CLINICAL DATA:  NG tube placement.  Bowel obstruction. EXAM: PORTABLE ABDOMEN - 1 VIEW COMPARISON:  One-view abdomen from the same day at 12:52 p.m. FINDINGS: The NG tube has been advanced. The side port is now in the stomach. The lung bases are clear. The distended loop of distal small bowel is again noted. The stomach is decompressed. There is no free air. Degenerative changes are again noted in the lumbar spine. IMPRESSION: 1. The side port of the NG tube is now in the stomach with decompression of the stomach. 2. Dilated loop of distal small bowel is unchanged. Electronically Signed   By: San Morelle M.D.   On: 09/28/2017  15:27   Dg Abd Portable 1v  Result Date: 09/28/2017 CLINICAL DATA:  NG tube placement. EXAM: PORTABLE ABDOMEN - 1 VIEW COMPARISON:  CT 09/28/2017. FINDINGS: NG tube noted with tip below left hemidiaphragm. Side hole is at the gastroesophageal junction. Advancement of approximately 10 cm should be considered. Air-filled loops of small bowel containing oral contrast from prior CT noted. Follow-up abdominal series suggested for continued evaluation. No acute bony abnormality. Mild lumbar spine scoliosis. IMPRESSION: 1. NG tube noted with tip below left hemidiaphragm. Side-hole is a gastroesophageal junction. Advancement of approximately 10 cm should be considered. 2. Air-filled loops of small bowel containing oral contrast noted from prior CT. Follow-up abdominal series suggested for continued evaluation. Electronically Signed   By: Gary   On: 09/28/2017 13:08    Scheduled Meds: . cefoTEtan in Dextrose 5%      . [MAR Hold] chlorhexidine  15 mL Mouth Rinse BID  . [MAR Hold] heparin  5,000 Units Subcutaneous Q8H  . [MAR Hold] insulin aspart  0-9 Units  Subcutaneous TID WC  . [MAR Hold] insulin glargine  15 Units Subcutaneous QHS  . [MAR Hold] mouth rinse  15 mL Mouth Rinse q12n4p  . [MAR Hold] pantoprazole (PROTONIX) IV  40 mg Intravenous QPC supper  . scopolamine  1 patch Transdermal Q72H   Continuous Infusions: . sodium chloride 100 mL/hr (09/28/17 2140)    Time spent: 30 minutes   [facility] Pager [phone]. If 7PM-7AM, please contact night-coverage at [email or web address], password [facility] 09/29/2017, 4:35 PM  LOS: 1 day

## 2017-09-29 NOTE — Op Note (Signed)
09/29/2017  4:50 PM  PATIENT:  Alex Mccarty  72 y.o. male  PRE-OPERATIVE DIAGNOSIS:  Cecal Volvulus  POST-OPERATIVE DIAGNOSIS:  Cecal Volvulus  PROCEDURE:  Procedure(s): EXPLORATORY LAPAROTOMY (N/A) COLON RESECTION RIGHT (Right)  SURGEON:  Surgeon(s) and Role:    * Jovita Kussmaul, MD - Primary    * White, Sharon Mt, MD - Assisting  PHYSICIAN ASSISTANT:   ASSISTANTS: Dr. Dema Severin   ANESTHESIA:   general  EBL:  50 mL   BLOOD ADMINISTERED:none  DRAINS: none   LOCAL MEDICATIONS USED:  NONE  SPECIMEN:  Source of Specimen:  terminal ileum and right colon  DISPOSITION OF SPECIMEN:  PATHOLOGY  COUNTS:  YES  TOURNIQUET:  * No tourniquets in log *  DICTATION: .Dragon Dictation   After informed consent was obtained the patient was brought to the operating room and placed in the supine position on the operating table.  After adequate induction of general anesthesia the patient's abdomen was prepped with ChloraPrep, allowed to dry, and draped in usual sterile manner.  An appropriate timeout was performed.  A midline incision was made with a 10 blade knife.  The incision was carried through the skin and subcutaneous tissue sharply with electrocautery until the linea alba was identified.  The linea alba was incised with the electrocautery.  The preperitoneal space was probed bluntly with a hemostat until the peritoneum was opened and access was gained to the abdominal cavity.  The rest of the incision was opened under direct vision.  A large dilated cecum was readily identified.  The right colon was mobilized by incising the retroperitoneal attachment along the white line of [place].  I then chose a site to divide the bowel at the terminal ileum and at the hepatic flexure.  The mesentery to the sites was opened sharply with electrocautery.  At each site a GIA-75 stapler was placed across the bowel, clamped, and fired thereby dividing the bowel between staple lines.  The mesentery to the  right colon was taken down sharply with the LigaSure.  The main right colic vessel was also clamped with a Kelly clamp and ligated with a 2-0 silk suture ligature.  The rest of the abdomen was inspected and no other abnormalities were noted.  The NG tube was confirmed in the stomach.  The terminal ileum and transverse colon approximated each other easily.  A small opening was made on the antimesenteric edge of the small bowel and transverse colon with the electrocautery.  Each limb of a GIA-75 stapler was placed down the appropriate limb of bowel, clamped, and fired thereby creating a nice widely patent enteroenterostomy.  The common opening was then closed with a TA 60 stapler.  The staple line was then imbricated with multiple 2-0 silk Lembert stitches as well as a 2-0 silk crotch stitch.  The mesenteric defect was also closed with interrupted 2-0 silk stitches.  The anastomosis appeared healthy without any tension.  It was allowed to fall back into the abdominal cavity.  The abdomen was then irrigated with copious amounts of saline.  All gowns and gloves were changed and new drapes were applied.  The fascia of the anterior abdominal wall was then closed with 2 running #1 double-stranded looped PDS sutures.  The subcutaneous tissue was irrigated with copious amounts of saline and the skin was closed with staples.  Sterile dressings were applied.  The patient tolerated the procedure well.  At the end of the case all needle sponge and instrument counts  were correct.  The patient was then awakened and taken to recovery in stable condition.  PLAN OF CARE: Admit to inpatient   PATIENT DISPOSITION:  PACU - hemodynamically stable.   Delay start of Pharmacological VTE agent (>24hrs) due to surgical blood loss or risk of bleeding: no

## 2017-09-29 NOTE — Anesthesia Preprocedure Evaluation (Addendum)
Anesthesia Evaluation  Patient identified by MRN, date of birth, ID band Patient awake    Reviewed: Allergy & Precautions, NPO status , Patient's Chart, lab work & pertinent test results  Airway Mallampati: III  TM Distance: >3 FB Neck ROM: Full    Dental no notable dental hx.    Pulmonary former smoker,    Pulmonary exam normal breath sounds clear to auscultation       Cardiovascular hypertension, Pt. on medications Normal cardiovascular exam Rhythm:Regular Rate:Normal  ECG: NSR, rate 87   Neuro/Psych  Headaches, negative psych ROS   GI/Hepatic Neg liver ROS, GERD  Controlled and Medicated,  Endo/Other  diabetes, Insulin Dependent  Renal/GU Renal InsufficiencyRenal disease     Musculoskeletal negative musculoskeletal ROS (+)   Abdominal   Peds  Hematology HLD   Anesthesia Other Findings Cecal Volvulus NG tube to left nares  Reproductive/Obstetrics                           Anesthesia Physical Anesthesia Plan  ASA: III  Anesthesia Plan: General   Post-op Pain Management:    Induction: Intravenous  PONV Risk Score and Plan: 4 or greater and Dexamethasone, Ondansetron, Midazolam and Scopolamine patch - Pre-op  Airway Management Planned: Oral ETT  Additional Equipment:   Intra-op Plan:   Post-operative Plan: Extubation in OR  Informed Consent: I have reviewed the patients History and Physical, chart, labs and discussed the procedure including the risks, benefits and alternatives for the proposed anesthesia with the patient or authorized representative who has indicated his/her understanding and acceptance.   Dental advisory given  Plan Discussed with: CRNA  Anesthesia Plan Comments:        Anesthesia Quick Evaluation

## 2017-09-30 LAB — GLUCOSE, CAPILLARY
Glucose-Capillary: 197 mg/dL — ABNORMAL HIGH (ref 65–99)
Glucose-Capillary: 169 mg/dL — ABNORMAL HIGH (ref 65–99)
Glucose-Capillary: 160 mg/dL — ABNORMAL HIGH (ref 65–99)
Glucose-Capillary: 177 mg/dL — ABNORMAL HIGH (ref 65–99)
Glucose-Capillary: 170 mg/dL — ABNORMAL HIGH (ref 65–99)

## 2017-09-30 MED ORDER — DIPHENHYDRAMINE HCL 50 MG/ML IJ SOLN: 12.5000 mg | Freq: Four times a day (QID) | INTRAMUSCULAR | Status: DC | PRN

## 2017-09-30 MED ORDER — PROMETHAZINE HCL 25 MG/ML IJ SOLN
12.5000 mg | INTRAMUSCULAR | Status: DC | PRN
  Administered 2017-09-30: 12.5 mg via INTRAVENOUS
  Filled 2017-09-30: qty 1

## 2017-09-30 MED ORDER — ONDANSETRON HCL 4 MG/2ML IJ SOLN
4.0000 mg | Freq: Four times a day (QID) | INTRAMUSCULAR | Status: DC | PRN
Start: 2017-09-30 — End: 2017-10-03

## 2017-09-30 MED ORDER — HYDROMORPHONE 1 MG/ML IV SOLN
INTRAVENOUS | Status: DC
  Administered 2017-09-30: 13:00:00 via INTRAVENOUS
  Administered 2017-09-30: 0 mg via INTRAVENOUS
  Administered 2017-10-01: 1.5 mg via INTRAVENOUS
  Administered 2017-10-01: 0.6 mg via INTRAVENOUS
  Administered 2017-10-01 (×2): 0.3 mg via INTRAVENOUS
  Administered 2017-10-02: 0.6 mL via INTRAVENOUS
  Administered 2017-10-02 (×2): 1.5 mg via INTRAVENOUS
  Administered 2017-10-02: 1.2 mg via INTRAVENOUS
  Administered 2017-10-02: 0.9 mg via INTRAVENOUS
  Administered 2017-10-02: 0.6 mg via INTRAVENOUS
  Administered 2017-10-03: 0.9 mg via INTRAVENOUS
  Administered 2017-10-03: 1.2 mg via INTRAVENOUS
  Administered 2017-10-03: 0.3 mg via INTRAVENOUS
  Filled 2017-09-30: qty 25

## 2017-09-30 MED ORDER — SODIUM CHLORIDE 0.9% FLUSH: 9.0000 mL | INTRAVENOUS | Status: DC | PRN

## 2017-09-30 MED ORDER — KETOROLAC TROMETHAMINE 15 MG/ML IJ SOLN
15.0000 mg | Freq: Four times a day (QID) | INTRAMUSCULAR | Status: AC
  Administered 2017-09-30 – 2017-10-01 (×4): 15 mg via INTRAVENOUS
  Filled 2017-09-30 (×3): qty 1

## 2017-09-30 MED ORDER — KCL IN DEXTROSE-NACL 20-5-0.45 MEQ/L-%-% IV SOLN
INTRAVENOUS | Status: DC
  Administered 2017-09-30 – 2017-10-01 (×3): via INTRAVENOUS
  Administered 2017-10-02: 1000 mL via INTRAVENOUS
  Administered 2017-10-02 – 2017-10-03 (×2): via INTRAVENOUS
  Administered 2017-10-03 – 2017-10-04 (×2): 1000 mL via INTRAVENOUS
  Administered 2017-10-04: 75 mL/h via INTRAVENOUS
  Filled 2017-09-30 (×12): qty 1000

## 2017-09-30 MED ORDER — DIPHENHYDRAMINE HCL 12.5 MG/5ML PO ELIX: 12.5000 mg | ORAL_SOLUTION | Freq: Four times a day (QID) | ORAL | Status: DC | PRN

## 2017-09-30 MED ORDER — NALOXONE HCL 0.4 MG/ML IJ SOLN: 0.4000 mg | INTRAMUSCULAR | Status: DC | PRN

## 2017-09-30 NOTE — Progress Notes (Signed)
Dr Harlow Asa paged due to patient being nauseated and zofran not due until after 2000 tonight. NG irrigated with air, including vent; NGT is draining green liquid. Donne Hazel, RN

## 2017-09-30 NOTE — Progress Notes (Signed)
General Surgery [facility]  Assessment & Plan: POD#1 - status post right colectomy for cecal volvulus  NG decompression, IVF, NPO  Change to dilaudid PCA, add Toradol IV for 24 hours  Encouraged IS use, Lehr, MD, [facility]       Office: [phone]    Chief Complaint: Cecal volvulus  Subjective: Patient in bed, wife at bedside.  Complains of pain, some nausea.  Nurse in room.  Objective: Vital signs in last 24 hours: Temp:  [97.5 F (36.4 C)-99.9 F (37.7 C)] 98.3 F (36.8 C) (01/12 1000) Pulse Rate:  [75-95] 91 (01/12 1000) Resp:  [11-23] 18 (01/12 1000) BP: (155-196)/(74-92) 159/74 (01/12 1000) SpO2:  [96 %-100 %] 98 % (01/12 1000) Weight:  [85.3 kg (188 lb)] 85.3 kg (188 lb) (01/11 1430) Last BM Date: 09/27/17  Intake/Output from previous day: 01/11 0701 - 01/12 0700 In: 3910 [P.O.:60; I.V.:3850] Out: 1475 [Urine:1225; Emesis/NG output:200; Blood:50] Intake/Output this shift: No intake/output data recorded.  Physical Exam: HEENT - sclerae clear, mucous membranes moist Neck - soft Chest - clear bilaterally Cor - RRR Abdomen - soft, mild distension; quiet; bilious in NG tube; wound dry and intact with dressing in place Ext - no edema, non-tender Neuro - alert & oriented, no focal deficits  Lab Results:  Recent Labs    09/28/17 1920 09/29/17 0531  WBC 6.7 5.8  HGB 14.4 13.4  HCT 43.8 41.3  PLT 138* 130*   BMET Recent Labs    09/28/17 0811 09/28/17 1920 09/29/17 0531  NA 138  --  141  K 4.0  --  4.3  CL 107  --  112*  CO2 22  --  25  GLUCOSE 151*  --  83  BUN 27*  --  23*  CREATININE 2.12* 2.09* 1.91*  CALCIUM 9.4  --  8.9   PT/INR No results for input(s): LABPROT, INR in the last 72 hours. Comprehensive Metabolic Panel:    Component Value Date/Time   NA 141 09/29/2017 0531   NA 138 09/28/2017 0811   NA 142 09/09/2014 0919   K 4.3 09/29/2017 0531   K 4.0  09/28/2017 0811   CL 112 (H) 09/29/2017 0531   CL 107 09/28/2017 0811   CO2 25 09/29/2017 0531   CO2 22 09/28/2017 0811   BUN 23 (H) 09/29/2017 0531   BUN 27 (H) 09/28/2017 0811   BUN 33 (H) 09/09/2014 0919   CREATININE 1.91 (H) 09/29/2017 0531   CREATININE 2.09 (H) 09/28/2017 1920   GLUCOSE 83 09/29/2017 0531   GLUCOSE 151 (H) 09/28/2017 0811   CALCIUM 8.9 09/29/2017 0531   CALCIUM 9.4 09/28/2017 0811   AST 17 09/28/2017 0811   ALT 23 09/28/2017 0811   ALKPHOS 65 09/28/2017 0811   BILITOT 1.1 09/28/2017 0811   PROT 7.5 09/28/2017 0811   ALBUMIN 3.8 09/28/2017 0811    Studies/Results: Ct Abdomen Pelvis Wo Contrast  Addendum Date: 09/28/2017   ADDENDUM REPORT: 09/28/2017 12:34 ADDENDUM: A dilated loop of distal small bowel present in the mid abdomen. This measures up to 9.1 cm in transverse diameter. More distal small bowel and colon are collapsed. This represents a focal distal small bowel obstruction without mass. Given that the dilated bowel extends anterior to the colon, internal hernia should be considered. These results were discussed by telephone on 09/28/2017 at 12:33 pm with Dr. Shellee Milo  CARDAMA . Electronically Signed   By: San Morelle M.D.   On: 09/28/2017 12:34   Result Date: 09/28/2017 CLINICAL DATA:  Abdominal pain and distention. Nausea beginning at 9 a.m. yesterday. EXAM: CT ABDOMEN AND PELVIS WITHOUT CONTRAST TECHNIQUE: Multidetector CT imaging of the abdomen and pelvis was performed following the standard protocol without IV contrast. COMPARISON:  CT of the abdomen 02/14/2015. MRI of the lumbar spine 07/03/2017. CT of the abdomen and pelvis 03/09/2013 FINDINGS: Lower chest: The lung bases are clear without focal nodule, mass, or airspace disease. The a the heart size is normal. A small hiatal hernia is present. Hepatobiliary: No focal liver abnormality is seen. No gallstones, gallbladder wall thickening, or biliary dilatation. Pancreas: Atrophy is stable.  No discrete  mass lesion is present. Spleen: Normal in size without focal abnormality. Adrenals/Urinary Tract: Adrenal glands are normal bilaterally. Multiple bilateral renal cysts are stable. There is a complex cyst at the lower pole of the left kidney which measures 2.7 x 2.2 x 2.9 cm. This lesion is slightly more prominent than on prior studies. Recommend follow-up MRI with and without contrast further evaluation. Given the patient's renal insufficiency, a partial dose of MultiHance could be administered. Stomach/Bowel: Stomach and duodenum are within normal limits. Small bowel is unremarkable. The terminal ileum is within normal limits. The ascending and transverse colon are mostly collapsed. The descending and sigmoid colon are normal. Vascular/Lymphatic: Dense atherosclerotic calcifications are present within the aorta and branch vessels without aneurysm. Reproductive: Prostate is unremarkable. Other: Fat herniates into the inguinal canals bilaterally. There is no associated bowel. A small umbilical hernia contains fat. There is no herniation of bowel. No obstruction is present. No free fluid or free air is present. Musculoskeletal: S-shaped curvature of the lumbar spine is convex to the right at L2 and to the left at L4-5 associated endplate changes are present. AP alignment is anatomic. No focal lytic or blastic lesions are present. Degenerative changes are noted in the sacroiliac joints bilaterally. Pelvis is otherwise within normal limits. The hips are located. IMPRESSION: 1. No acute or focal lesion to account for the patient's symptoms of abdominal pain, distention, or nausea. 2. Complex cyst at the lower pole of the left kidney demonstrates some interval growth since 2014. Recommend follow-up MRI of the abdomen without and with contrast for further evaluation. Given the patient's renal insufficiency, a partial dose of MultiHance could be administered. 3. Fat herniates into the inguinal canals bilaterally without  associated bowel. 4. 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 Electronically Signed: By: San Morelle M.D. On: 09/28/2017 10:46   Dg Abd 1 View  Result Date: 09/29/2017 CLINICAL DATA:  History of bowel obstruction. Abdominal pain and distension. EXAM: ABDOMEN - 1 VIEW COMPARISON:  Earlier same day FINDINGS: Nasogastric or orogastric tube tip in the antrum. Dilated intestinal loops remains evident in the central abdomen, now with an air-contrast level. No free air. IMPRESSION: Nasogastric or orogastric tube tip in the gastric antrum. Electronically Signed   By: Nelson Chimes M.D.   On: 09/29/2017 09:57   Abd 1 View (kub)  Result Date: 09/29/2017 CLINICAL DATA:  Abdominal distention. Follow-up small-bowel obstruction. EXAM: ABDOMEN - 1 VIEW COMPARISON:  09/28/2017; CT abdomen and pelvis-09/28/2017 FINDINGS: Interval removal of enteric tube. Worsening gaseous distension of the cecum, now measuring approximately 11.5 cm in diameter with further extension to the cranial aspect of the abdomen. This finding is associated with a paucity of additional bowel gas. Nondiagnostic evaluation for pneumoperitoneum  secondary to supine positioning and exclusion of the lower thorax. No acute osseus abnormalities. Degenerative change of the bilateral hips and lower lumbar spine is suspected though incompletely evaluated. Phleboliths overlie the right hemipelvis. Otherwise, no definitive abnormal intra-abdominal calcifications. IMPRESSION: Interval removal of enteric tube with findings suggestive of worsening cecal volvulus. Critical Value/emergent results were called by telephone at the time of interpretation on 09/29/2017 at 9:34 am to Dr. Barton Dubois , who verbally acknowledged these results. Electronically Signed   By: Sandi Mariscal M.D.   On: 09/29/2017 09:42   Dg Abd Portable 1 View  Result Date: 09/28/2017 CLINICAL DATA:  NG tube placement.  Bowel obstruction. EXAM: PORTABLE ABDOMEN - 1 VIEW  COMPARISON:  One-view abdomen from the same day at 12:52 p.m. FINDINGS: The NG tube has been advanced. The side port is now in the stomach. The lung bases are clear. The distended loop of distal small bowel is again noted. The stomach is decompressed. There is no free air. Degenerative changes are again noted in the lumbar spine. IMPRESSION: 1. The side port of the NG tube is now in the stomach with decompression of the stomach. 2. Dilated loop of distal small bowel is unchanged. Electronically Signed   By: San Morelle M.D.   On: 09/28/2017 15:27   Dg Abd Portable 1v  Result Date: 09/28/2017 CLINICAL DATA:  NG tube placement. EXAM: PORTABLE ABDOMEN - 1 VIEW COMPARISON:  CT 09/28/2017. FINDINGS: NG tube noted with tip below left hemidiaphragm. Side hole is at the gastroesophageal junction. Advancement of approximately 10 cm should be considered. Air-filled loops of small bowel containing oral contrast from prior CT noted. Follow-up abdominal series suggested for continued evaluation. No acute bony abnormality. Mild lumbar spine scoliosis. IMPRESSION: 1. NG tube noted with tip below left hemidiaphragm. Side-hole is a gastroesophageal junction. Advancement of approximately 10 cm should be considered. 2. Air-filled loops of small bowel containing oral contrast noted from prior CT. Follow-up abdominal series suggested for continued evaluation. Electronically Signed   By: Marcello Moores  Register   On: 09/28/2017 13:08      Destrehan Jerilynn Mages 09/30/2017  Patient ID: Alex Mccarty, male   DOB: [birth year], 72 y.o.   MRN: [id number]

## 2017-10-01 LAB — GLUCOSE, CAPILLARY
Glucose-Capillary: 131 mg/dL — ABNORMAL HIGH (ref 65–99)
Glucose-Capillary: 111 mg/dL — ABNORMAL HIGH (ref 65–99)
Glucose-Capillary: 113 mg/dL — ABNORMAL HIGH (ref 65–99)
Glucose-Capillary: 132 mg/dL — ABNORMAL HIGH (ref 65–99)

## 2017-10-01 MED ORDER — SODIUM CHLORIDE 0.9 % IV SOLN
12.5000 mg | Freq: Once | INTRAVENOUS | Status: AC
  Administered 2017-10-01: 12.5 mg via INTRAVENOUS
  Filled 2017-10-01: qty 0.5

## 2017-10-01 MED ORDER — SODIUM CHLORIDE 0.9 % IV SOLN
25.0000 mg | Freq: Three times a day (TID) | INTRAVENOUS | Status: DC | PRN
  Administered 2017-10-02 – 2017-10-03 (×3): 25 mg via INTRAVENOUS
  Filled 2017-10-01 (×4): qty 1

## 2017-10-01 MED ORDER — PROMETHAZINE HCL 25 MG/ML IJ SOLN
12.5000 mg | INTRAMUSCULAR | Status: DC | PRN
  Administered 2017-10-03: 12.5 mg via INTRAVENOUS
  Filled 2017-10-01: qty 1

## 2017-10-01 NOTE — Anesthesia Postprocedure Evaluation (Signed)
Anesthesia Post Note  Patient: Alex Mccarty  Procedure(s) Performed: EXPLORATORY LAPAROTOMY (N/A ) COLON RESECTION RIGHT (Right )     Patient location during evaluation: PACU Anesthesia Type: General Level of consciousness: awake and alert Pain management: pain level controlled Vital Signs Assessment: post-procedure vital signs reviewed and stable Respiratory status: spontaneous breathing, nonlabored ventilation, respiratory function stable and patient connected to nasal cannula oxygen Cardiovascular status: blood pressure returned to baseline and stable Postop Assessment: no apparent nausea or vomiting Anesthetic complications: no    Last Vitals:  Vitals:   10/01/17 0804 10/01/17 1000  BP:  128/71  Pulse:  67  Resp: 13 14  Temp:  36.8 C  SpO2: 99% 98%    Last Pain:  Vitals:   10/01/17 1000  TempSrc: Oral  PainSc:                  Catalina Gravel

## 2017-10-01 NOTE — Progress Notes (Signed)
General Surgery [facility]  Assessment & Plan: POD#2 - status post right colectomy for cecal volvulus             NG decompression, IVF, NPO             Much improved with dilaudid PCA, Toradol IV for 24 hours             Encouraged IS use, OOB, ambulation              Clamp NG today - will remove tomorrow if tolerated overnight         Earnstine Regal, MD, [facility]       Office: [phone]    Chief Complaint: Cecal volvulus  Subjective: Patient in bed, wife at bedside.  Much improved from yesterday.  Nausea resolved with Phenergan.  Minimal out of NG tube.  Objective: Vital signs in last 24 hours: Temp:  [97.6 F (36.4 C)-98.3 F (36.8 C)] 97.6 F (36.4 C) (01/13 0531) Pulse Rate:  [67-91] 68 (01/13 0531) Resp:  [13-18] 13 (01/13 0804) BP: (138-159)/(64-82) 146/74 (01/13 0531) SpO2:  [96 %-100 %] 99 % (01/13 0804) Last BM Date: 09/27/17  Intake/Output from previous day: 01/12 0701 - 01/13 0700 In: 2656.3 [I.V.:2656.3] Out: 1175 [Urine:1125; Emesis/NG output:50] Intake/Output this shift: No intake/output data recorded.  Physical Exam: HEENT - sclerae clear, mucous membranes moist Neck - soft Chest - clear bilaterally Cor - RRR Abdomen - soft, mild distension; few BS present; wound dry and intact; bilious in NG, limited Ext - no edema, non-tender Neuro - alert & oriented, no focal deficits  Lab Results:  Recent Labs    09/28/17 1920 09/29/17 0531  WBC 6.7 5.8  HGB 14.4 13.4  HCT 43.8 41.3  PLT 138* 130*   BMET Recent Labs    09/28/17 1920 09/29/17 0531  NA  --  141  K  --  4.3  CL  --  112*  CO2  --  25  GLUCOSE  --  83  BUN  --  23*  CREATININE 2.09* 1.91*  CALCIUM  --  8.9   PT/INR No results for input(s): LABPROT, INR in the last 72 hours. Comprehensive Metabolic Panel:    Component Value Date/Time   NA 141 09/29/2017 0531   NA 138 09/28/2017 0811   NA 142 09/09/2014 0919   K  4.3 09/29/2017 0531   K 4.0 09/28/2017 0811   CL 112 (H) 09/29/2017 0531   CL 107 09/28/2017 0811   CO2 25 09/29/2017 0531   CO2 22 09/28/2017 0811   BUN 23 (H) 09/29/2017 0531   BUN 27 (H) 09/28/2017 0811   BUN 33 (H) 09/09/2014 0919   CREATININE 1.91 (H) 09/29/2017 0531   CREATININE 2.09 (H) 09/28/2017 1920   GLUCOSE 83 09/29/2017 0531   GLUCOSE 151 (H) 09/28/2017 0811   CALCIUM 8.9 09/29/2017 0531   CALCIUM 9.4 09/28/2017 0811   AST 17 09/28/2017 0811   ALT 23 09/28/2017 0811   ALKPHOS 65 09/28/2017 0811   BILITOT 1.1 09/28/2017 0811   PROT 7.5 09/28/2017 0811   ALBUMIN 3.8 09/28/2017 0811    Studies/Results: Dg Abd 1 View  Result Date: 09/29/2017 CLINICAL DATA:  History of bowel obstruction. Abdominal pain and distension. EXAM: ABDOMEN - 1 VIEW COMPARISON:  Earlier same day FINDINGS: Nasogastric or orogastric tube tip in the antrum. Dilated intestinal loops remains evident in the central  abdomen, now with an air-contrast level. No free air. IMPRESSION: Nasogastric or orogastric tube tip in the gastric antrum. Electronically Signed   By: Nelson Chimes M.D.   On: 09/29/2017 09:57      Keiondre Colee M 10/01/2017  Patient ID: Alex Mccarty, male   DOB: Apr 27, 1946, 72 y.o.   MRN: [id number]

## 2017-10-02 ENCOUNTER — Encounter (HOSPITAL_COMMUNITY): Payer: Self-pay | Admitting: General Surgery

## 2017-10-02 LAB — BASIC METABOLIC PANEL
Anion gap: 4 — ABNORMAL LOW (ref 5–15)
BUN: 29 mg/dL — ABNORMAL HIGH (ref 6–20)
CO2: 23 mmol/L (ref 22–32)
Calcium: 8.3 mg/dL — ABNORMAL LOW (ref 8.9–10.3)
Chloride: 109 mmol/L (ref 101–111)
Creatinine, Ser: 2.05 mg/dL — ABNORMAL HIGH (ref 0.61–1.24)
GFR calc Af Amer: 36 mL/min — ABNORMAL LOW (ref >60)
GFR calc non Af Amer: 31 mL/min — ABNORMAL LOW (ref >60)
Glucose, Bld: 158 mg/dL — ABNORMAL HIGH (ref 65–99)
Potassium: 4.4 mmol/L (ref 3.5–5.1)
Sodium: 136 mmol/L (ref 135–145)

## 2017-10-02 LAB — CBC
HCT: 40.9 % (ref 39.0–52.0)
Hemoglobin: 13.1 g/dL (ref 13.0–17.0)
MCH: 30.5 pg (ref 26.0–34.0)
MCHC: 32 g/dL (ref 30.0–36.0)
MCV: 95.3 fL (ref 78.0–100.0)
Platelets: 98 10*3/uL — ABNORMAL LOW (ref 150–400)
RBC: 4.29 MIL/uL (ref 4.22–5.81)
RDW: 14.3 % (ref 11.5–15.5)
WBC: 5.3 10*3/uL (ref 4.0–10.5)

## 2017-10-02 LAB — GLUCOSE, CAPILLARY
Glucose-Capillary: 159 mg/dL — ABNORMAL HIGH (ref 65–99)
Glucose-Capillary: 148 mg/dL — ABNORMAL HIGH (ref 65–99)
Glucose-Capillary: 167 mg/dL — ABNORMAL HIGH (ref 65–99)
Glucose-Capillary: 156 mg/dL — ABNORMAL HIGH (ref 65–99)

## 2017-10-02 MED ORDER — METHOCARBAMOL 1000 MG/10ML IJ SOLN
500.0000 mg | Freq: Three times a day (TID) | INTRAVENOUS | Status: DC | PRN
  Administered 2017-10-02: 500 mg via INTRAVENOUS
  Filled 2017-10-02: qty 550

## 2017-10-02 MED ORDER — BISACODYL 10 MG RE SUPP
10.0000 mg | Freq: Two times a day (BID) | RECTAL | Status: DC
  Administered 2017-10-02 – 2017-10-04 (×3): 10 mg via RECTAL
  Filled 2017-10-02 (×6): qty 1

## 2017-10-02 NOTE — Care Management Important Message (Signed)
Important Message  Patient Details  Name: RYKEN PASCHAL MRN: [id number] Date of Birth: [birth year]   Medicare Important Message Given:  Yes    Kerin Salen 10/02/2017, 11:10 AMImportant Message  Patient Details  Name: NAIN RUDD MRN: [id number] Date of Birth: [birth year]   Medicare Important Message Given:  Yes    Kerin Salen 10/02/2017, 11:10 AM

## 2017-10-02 NOTE — Progress Notes (Signed)
[facility] Progress Note  3 Days Post-Op  Subjective: CC- hiccups NG tube clamped x24 hours, patient tolerating well. Not passing flatus, but denies abdominal distension or n/v. Abdomen sore because he continues to have hiccups, thorazine helps temporarily.   Objective: Vital signs in last 24 hours: Temp:  [98.2 F (36.8 C)-99 F (37.2 C)] 98.4 F (36.9 C) (01/14 0512) Pulse Rate:  [67-90] 90 (01/14 0512) Resp:  [11-20] 12 (01/14 0815) BP: (128-152)/(66-74) 152/66 (01/14 0512) SpO2:  [92 %-98 %] 95 % (01/14 0815) Last BM Date: 09/27/17  Intake/Output from previous day: 01/13 0701 - 01/14 0700 In: 3046.6 [P.O.:30; I.V.:2991.6; IV Piggyback:25] Out: 3200 [Urine:2800; Emesis/NG output:400] Intake/Output this shift: No intake/output data recorded.  PE: Gen:  Alert, NAD, pleasant HEENT: EOM's intact, pupils equal and round Card:  RRR, no M/G/R heard Pulm:  effort normal Abd: Soft, mild distension, +BS, midline incision C/D/I with staples intact and honeycomb dressing in place Psych: A&Ox3  Skin: no rashes noted, warm and dry  Lab Results:  Recent Labs    10/02/17 0736  WBC 5.3  HGB 13.1  HCT 40.9  PLT 98*   BMET Recent Labs    10/02/17 0736  NA 136  K 4.4  CL 109  CO2 23  GLUCOSE 158*  BUN 29*  CREATININE 2.05*  CALCIUM 8.3*   PT/INR No results for input(s): LABPROT, INR in the last 72 hours. CMP     Component Value Date/Time   NA 136 10/02/2017 0736   NA 142 09/09/2014 0919   K 4.4 10/02/2017 0736   CL 109 10/02/2017 0736   CO2 23 10/02/2017 0736   GLUCOSE 158 (H) 10/02/2017 0736   BUN 29 (H) 10/02/2017 0736   BUN 33 (H) 09/09/2014 0919   CREATININE 2.05 (H) 10/02/2017 0736   CALCIUM 8.3 (L) 10/02/2017 0736   PROT 7.5 09/28/2017 0811   ALBUMIN 3.8 09/28/2017 0811   AST 17 09/28/2017 0811   ALT 23 09/28/2017 0811   ALKPHOS 65 09/28/2017 0811   BILITOT 1.1 09/28/2017 0811   GFRNONAA 31 (L) 10/02/2017 0736   GFRAA 36 (L)  10/02/2017 0736   Lipase     Component Value Date/Time   LIPASE 35 09/28/2017 0812       Studies/Results: No results found.  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 Marlou Starks - POD 3 - path pending - Advance to clear liquids, if tolerating over the next couple hours ok to d/c NG tube. Continue ambulating. Continue PCA for now. Add IV robaxin.   ID - cefotetan perioperative FEN - IVF, clear liquids VTE - heparin Foley - d/c today Follow up - Dr. Marlou Starks   LOS: 4 days    Wellington Hampshire , [facility] 10/02/2017, 9:01 AM Pager: [phone] Consults: [phone] Mon-Fri 7:00 am-4:30 pm Sat-Sun 7:00  am-11:30 am

## 2017-10-02 NOTE — Discharge Instructions (Signed)
[facility]      [facility] [phone]  OPEN ABDOMINAL SURGERY: POST OP INSTRUCTIONS  Always review your discharge instruction sheet given to you by the facility where your surgery was performed.  IF YOU HAVE DISABILITY OR FAMILY LEAVE FORMS, YOU MUST BRING THEM TO THE OFFICE FOR PROCESSING.  PLEASE DO NOT GIVE THEM TO YOUR DOCTOR.  1. A prescription for pain medication may be given to you upon discharge.  Take your pain medication as prescribed, if needed.  If narcotic pain medicine is not needed, then you may take acetaminophen (Tylenol) or ibuprofen (Advil) as needed. 2. Take your usually prescribed medications unless otherwise directed. 3. If you need a refill on your pain medication, please contact your pharmacy. They will contact our office to request authorization.  Prescriptions will not be filled after 5pm or on week-ends. 4. You should follow a light diet the first few days after arrival home, such as soup and crackers, pudding, etc.unless your doctor has advised otherwise. A high-fiber, low fat diet can be resumed as tolerated.   Be sure to include lots of fluids daily. Most patients will experience some swelling and bruising on the chest and neck area.  Ice packs will help.  Swelling and bruising can take several days to resolve 5. Most patients will experience some swelling and bruising in the area of the incision. Ice pack will help. Swelling and bruising can take several days to resolve..  6. It is common to experience some constipation if taking pain medication after surgery.  Increasing fluid intake and taking a stool softener will usually help or prevent this problem from occurring.  A mild laxative (Milk of Magnesia or Miralax) should be taken according to package directions if there are no bowel movements after 48 hours. 7.  You may have steri-strips (small skin tapes) in place directly over the incision.  These strips should be left on the skin for 7-10 days.  If your  surgeon used skin glue on the incision, you may shower in 24 hours.  The glue will flake off over the next 2-3 weeks.  Any sutures or staples will be removed at the office during your follow-up visit. You may find that a light gauze bandage over your incision may keep your staples from being rubbed or pulled. You may shower and replace the bandage daily. 8. ACTIVITIES:  You may resume regular (light) daily activities beginning the next day--such as daily self-care, walking, climbing stairs--gradually increasing activities as tolerated.  You may have sexual intercourse when it is comfortable.  Refrain from any heavy lifting or straining until approved by your doctor. a. You may drive when you no longer are taking prescription pain medication, you can comfortably wear a seatbelt, and you can safely maneuver your car and apply brakes b. Return to Work: ___________________________________ 9. You should see your doctor in the office for a follow-up appointment approximately two weeks after your surgery.  Make sure that you call for this appointment within a day or two after you arrive home to insure a convenient appointment time. OTHER INSTRUCTIONS:  _____________________________________________________________ _____________________________________________________________  WHEN TO CALL YOUR DOCTOR: 1. Fever over 101.0 2. Inability to urinate 3. Nausea and/or vomiting 4. Extreme swelling or bruising 5. Continued bleeding from incision. 6. Increased pain, redness, or drainage from the incision. 7. Difficulty swallowing or breathing 8. Muscle cramping or spasms. 9. Numbness or tingling in hands or feet or around lips.  The clinic staff is available to   answer your questions during regular business hours.  Please don't hesitate to call and ask to speak to one of the nurses if you have concerns.  For further questions, please visit [email or web address]   

## 2017-10-03 LAB — GLUCOSE, CAPILLARY
Glucose-Capillary: 143 mg/dL — ABNORMAL HIGH (ref 65–99)
Glucose-Capillary: 133 mg/dL — ABNORMAL HIGH (ref 65–99)
Glucose-Capillary: 122 mg/dL — ABNORMAL HIGH (ref 65–99)
Glucose-Capillary: 124 mg/dL — ABNORMAL HIGH (ref 65–99)

## 2017-10-03 LAB — BASIC METABOLIC PANEL
Anion gap: 5 (ref 5–15)
BUN: 23 mg/dL — ABNORMAL HIGH (ref 6–20)
CO2: 23 mmol/L (ref 22–32)
Calcium: 8.4 mg/dL — ABNORMAL LOW (ref 8.9–10.3)
Chloride: 108 mmol/L (ref 101–111)
Creatinine, Ser: 1.81 mg/dL — ABNORMAL HIGH (ref 0.61–1.24)
GFR calc Af Amer: 42 mL/min — ABNORMAL LOW (ref >60)
GFR calc non Af Amer: 36 mL/min — ABNORMAL LOW (ref >60)
Glucose, Bld: 132 mg/dL — ABNORMAL HIGH (ref 65–99)
Potassium: 4.5 mmol/L (ref 3.5–5.1)
Sodium: 136 mmol/L (ref 135–145)

## 2017-10-03 MED ORDER — ACETAMINOPHEN 10 MG/ML IV SOLN
1000.0000 mg | Freq: Four times a day (QID) | INTRAVENOUS | Status: DC
  Administered 2017-10-03: 1000 mg via INTRAVENOUS
  Filled 2017-10-03 (×2): qty 100

## 2017-10-03 MED ORDER — PROPRANOLOL HCL 20 MG PO TABS: 10.0000 mg | ORAL_TABLET | Freq: Every morning | ORAL | Status: DC

## 2017-10-03 MED ORDER — LOSARTAN POTASSIUM 50 MG PO TABS: 100.0000 mg | ORAL_TABLET | Freq: Every day | ORAL | Status: DC

## 2017-10-03 MED ORDER — HYDROMORPHONE HCL 1 MG/ML IJ SOLN
0.5000 mg | INTRAMUSCULAR | Status: DC | PRN
  Administered 2017-10-03: 1 mg via INTRAVENOUS
  Filled 2017-10-03: qty 1

## 2017-10-03 MED ORDER — GABAPENTIN 300 MG PO CAPS
300.0000 mg | ORAL_CAPSULE | Freq: Every day | ORAL | Status: DC
  Administered 2017-10-03 – 2017-10-05 (×3): 300 mg via ORAL
  Filled 2017-10-03 (×3): qty 1

## 2017-10-03 MED ORDER — METOPROLOL TARTRATE 5 MG/5ML IV SOLN
5.0000 mg | Freq: Four times a day (QID) | INTRAVENOUS | Status: DC
  Administered 2017-10-03 – 2017-10-05 (×9): 5 mg via INTRAVENOUS
  Filled 2017-10-03 (×9): qty 5

## 2017-10-03 MED ORDER — CALCIUM CARBONATE ANTACID 500 MG PO CHEW
2.0000 | CHEWABLE_TABLET | Freq: Three times a day (TID) | ORAL | Status: DC | PRN
  Administered 2017-10-03: 400 mg via ORAL
  Filled 2017-10-03: qty 2

## 2017-10-03 MED ORDER — PANTOPRAZOLE SODIUM 40 MG PO TBEC
40.0000 mg | DELAYED_RELEASE_TABLET | Freq: Two times a day (BID) | ORAL | Status: DC
  Administered 2017-10-03 – 2017-10-06 (×6): 40 mg via ORAL
  Filled 2017-10-03 (×6): qty 1

## 2017-10-03 MED ORDER — FAMOTIDINE IN NACL 20-0.9 MG/50ML-% IV SOLN
20.0000 mg | Freq: Two times a day (BID) | INTRAVENOUS | Status: DC
  Filled 2017-10-03: qty 50

## 2017-10-03 NOTE — Progress Notes (Signed)
[facility] Progress Note  4 Days Post-Op  Subjective: CC-  Up in chair. States that he feels worse than yesterday. Continue to have abdominal pain and distension. He is passing some gas and had 2 small BMs yesterday. Denies n/v. Hiccups have resolved but he is burping.  Objective: Vital signs in last 24 hours: Temp:  [97.7 F (36.5 C)-99.6 F (37.6 C)] 99 F (37.2 C) (01/15 0559) Pulse Rate:  [87-127] 111 (01/15 0559) Resp:  [12-18] 15 (01/15 0559) BP: (151-178)/(77-97) 152/79 (01/15 0559) SpO2:  [96 %-100 %] 97 % (01/15 0559) Last BM Date: 09/27/17  Intake/Output from previous day: 01/14 0701 - 01/15 0700 In: 4190 [P.O.:1060; I.V.:3000; IV Piggyback:130] Out: 2350 [Urine:2350] Intake/Output this shift: Total I/O In: -  Out: 700 [Urine:700]  PE: Gen:  Alert, NAD, pleasant HEENT: EOM's intact, pupils equal and round Card:  tachycardic Pulm:  effort normal Abd: Soft, distended, hypoactive BS, midline incision C/D/I with staples intact and honeycomb dressing in place Psych: A&Ox3  Skin: no rashes noted, warm and dry  Lab Results:  Recent Labs    10/02/17 0736  WBC 5.3  HGB 13.1  HCT 40.9  PLT 98*   BMET Recent Labs    10/02/17 0736 10/03/17 0442  NA 136 136  K 4.4 4.5  CL 109 108  CO2 23 23  GLUCOSE 158* 132*  BUN 29* 23*  CREATININE 2.05* 1.81*  CALCIUM 8.3* 8.4*   PT/INR No results for input(s): LABPROT, INR in the last 72 hours. CMP     Component Value Date/Time   NA 136 10/03/2017 0442   NA 142 09/09/2014 0919   K 4.5 10/03/2017 0442   CL 108 10/03/2017 0442   CO2 23 10/03/2017 0442   GLUCOSE 132 (H) 10/03/2017 0442   BUN 23 (H) 10/03/2017 0442   BUN 33 (H) 09/09/2014 0919   CREATININE 1.81 (H) 10/03/2017 0442   CALCIUM 8.4 (L) 10/03/2017 0442   PROT 7.5 09/28/2017 0811   ALBUMIN 3.8 09/28/2017 0811   AST 17 09/28/2017 0811   ALT 23 09/28/2017 0811   ALKPHOS 65 09/28/2017 0811   BILITOT 1.1 09/28/2017 0811   GFRNONAA  36 (L) 10/03/2017 0442   GFRAA 42 (L) 10/03/2017 0442   Lipase     Component Value Date/Time   LIPASE 35 09/28/2017 0812       Studies/Results: No results found.  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 Marlou Starks - POD 4 - path: FOCAL SUBMUCOSAL HEMORRHAGE AND ISCHEMIC CHANGE, BENIGN APPENDIX, TWO BENIGN LYMPH NODES, NO DYSPLASIA OR MALIGNANCY - Continue clear liquids until ileus resolves. D/c PCA, order home neurontin and IV tylenol. Continue ambulating and dulcolax suppositories. Check EKG and start IV metoprolol.  ID - cefotetan perioperative FEN - decrease IVF, clear liquids VTE - heparin Foley - out Follow up - Dr. Marlou Starks  LOS: 5 days    Wellington Hampshire , [facility] 10/03/2017, 8:37 AM Pager: [phone] Consults: [phone] Mon-Fri 7:00 am-4:30 pm Sat-Sun 7:00 am-11:30 am

## 2017-10-04 ENCOUNTER — Inpatient Hospital Stay (HOSPITAL_COMMUNITY): Payer: PPO

## 2017-10-04 LAB — GLUCOSE, CAPILLARY
Glucose-Capillary: 123 mg/dL — ABNORMAL HIGH (ref 65–99)
Glucose-Capillary: 138 mg/dL — ABNORMAL HIGH (ref 65–99)
Glucose-Capillary: 121 mg/dL — ABNORMAL HIGH (ref 65–99)
Glucose-Capillary: 160 mg/dL — ABNORMAL HIGH (ref 65–99)

## 2017-10-04 MED ORDER — METHOCARBAMOL 1000 MG/10ML IJ SOLN
500.0000 mg | Freq: Three times a day (TID) | INTRAVENOUS | Status: DC
  Administered 2017-10-04 – 2017-10-05 (×3): 500 mg via INTRAVENOUS
  Filled 2017-10-04 (×2): qty 550
  Filled 2017-10-04: qty 5
  Filled 2017-10-04: qty 550

## 2017-10-04 MED ORDER — HYDROMORPHONE HCL 1 MG/ML IJ SOLN: 0.5000 mg | INTRAMUSCULAR | Status: DC | PRN

## 2017-10-04 MED ORDER — METOCLOPRAMIDE HCL 5 MG/ML IJ SOLN
5.0000 mg | Freq: Four times a day (QID) | INTRAMUSCULAR | Status: DC
  Administered 2017-10-04 – 2017-10-05 (×4): 5 mg via INTRAVENOUS
  Filled 2017-10-04 (×4): qty 2

## 2017-10-04 MED ORDER — ACETAMINOPHEN 325 MG PO TABS: 650.0000 mg | ORAL_TABLET | Freq: Four times a day (QID) | ORAL | Status: DC | PRN

## 2017-10-04 MED ORDER — ACETAMINOPHEN 10 MG/ML IV SOLN
1000.0000 mg | Freq: Four times a day (QID) | INTRAVENOUS | Status: AC
  Administered 2017-10-04 – 2017-10-05 (×4): 1000 mg via INTRAVENOUS
  Filled 2017-10-04 (×4): qty 100

## 2017-10-04 MED ORDER — ACETAMINOPHEN 650 MG RE SUPP: 650.0000 mg | Freq: Four times a day (QID) | RECTAL | Status: DC | PRN

## 2017-10-04 NOTE — Progress Notes (Signed)
[facility] Progress Note  5 Days Post-Op  Subjective: CC-  Patient states that he continues to have abdominal pain and bloating, he feels worse than yesterday afternoon. Ok at rest, but has significant abdominal pain with ambulation. He took dilaudid 1mg  and felt very loopy. States that he takes a sip of liquid and gets bloated very quickly. Intermittent nausea, no emesis. He had a small BM yesterday, passing a small amount of flatus today.  Objective: Vital signs in last 24 hours: Temp:  [97.7 F (36.5 C)-99 F (37.2 C)] 98.2 F (36.8 C) (01/16 0815) Pulse Rate:  [73-102] 73 (01/16 0815) Resp:  [16-18] 18 (01/16 0815) BP: (131-167)/(77-86) 131/77 (01/16 0815) SpO2:  [92 %-99 %] 98 % (01/16 0815) Last BM Date: 10/03/17  Intake/Output from previous day: 01/15 0701 - 01/16 0700 In: 3230 [P.O.:1230; I.V.:1900; IV Piggyback:100] Out: 3950 [Urine:3950] Intake/Output this shift: No intake/output data recorded.  PE: Gen: Alert, NAD, pleasant HEENT: EOM's intact, pupils equal and round Card: RRR Pulm: CTAB,effort normal Abd: Soft,distended, few BS heard,midlineincision C/D/Iwith staples intact and no erythema or drainage, appropriately tender Psych: A&Ox3  Skin: no rashes noted, warm and dry  Lab Results:  Recent Labs    10/02/17 0736  WBC 5.3  HGB 13.1  HCT 40.9  PLT 98*   BMET Recent Labs    10/02/17 0736 10/03/17 0442  NA 136 136  K 4.4 4.5  CL 109 108  CO2 23 23  GLUCOSE 158* 132*  BUN 29* 23*  CREATININE 2.05* 1.81*  CALCIUM 8.3* 8.4*   PT/INR No results for input(s): LABPROT, INR in the last 72 hours. CMP     Component Value Date/Time   NA 136 10/03/2017 0442   NA 142 09/09/2014 0919   K 4.5 10/03/2017 0442   CL 108 10/03/2017 0442   CO2 23 10/03/2017 0442   GLUCOSE 132 (H) 10/03/2017 0442   BUN 23 (H) 10/03/2017 0442   BUN 33 (H) 09/09/2014 0919   CREATININE 1.81 (H) 10/03/2017 0442   CALCIUM 8.4 (L) 10/03/2017 0442   PROT  7.5 09/28/2017 0811   ALBUMIN 3.8 09/28/2017 0811   AST 17 09/28/2017 0811   ALT 23 09/28/2017 0811   ALKPHOS 65 09/28/2017 0811   BILITOT 1.1 09/28/2017 0811   GFRNONAA 36 (L) 10/03/2017 0442   GFRAA 42 (L) 10/03/2017 0442   Lipase     Component Value Date/Time   LIPASE 35 09/28/2017 0812       Studies/Results: No results found.  Anti-infectives: Anti-infectives (From admission, onward)   Start     Dose/Rate Route Frequency Ordered Stop   09/29/17 1519  cefoTEtan in Dextrose 5% (CEFOTAN) 2-2.08 GM-%(50ML) IVPB    Comments:  Waldron Session   : cabinet override      09/29/17 1519 09/30/17 0329       Assessment/Plan BPPV - holding home meds (meclizinePRN)  HTN - holding home meds, IV metoprolol q6 HLD - holding statin  DM-II - lantus and SSI, A1c 7 (1/10) Acute on CKD-3- Cr 1.81 (1/15), about baseline GERD - protonix, tums PRN DDD - home neurontin  Cecal Volvulus S/pEXPLORATORY LAPAROTOMY,COLON RESECTION RIGHT1/11 Dr. Marlou Starks -POD 5 - path: FOCAL SUBMUCOSAL HEMORRHAGE AND ISCHEMIC CHANGE, BENIGN APPENDIX, TWO BENIGN LYMPH NODES, NO DYSPLASIA OR MALIGNANCY - Patient seems to have a persistent ileus. Check abdominal xray. Add IV reglan. Schedule IV robaxin and tylenol or better pain control.  ID -cefotetan perioperative FEN - IVF,full liquids VTE -heparin Foley -out  Follow up -Dr. Marlou Starks    LOS: 6 days    Wellington Hampshire , [facility] 10/04/2017, 9:43 AM Pager: [phone] Consults: [phone] Mon-Fri 7:00 am-4:30 pm Sat-Sun 7:00 am-11:30 am

## 2017-10-05 DIAGNOSIS — K562 Volvulus: Secondary | ICD-10-CM

## 2017-10-05 LAB — BASIC METABOLIC PANEL
Anion gap: 4 — ABNORMAL LOW (ref 5–15)
BUN: 22 mg/dL — ABNORMAL HIGH (ref 6–20)
CO2: 24 mmol/L (ref 22–32)
Calcium: 9.5 mg/dL (ref 8.9–10.3)
Chloride: 110 mmol/L (ref 101–111)
Creatinine, Ser: 2.03 mg/dL — ABNORMAL HIGH (ref 0.61–1.24)
GFR calc Af Amer: 37 mL/min — ABNORMAL LOW (ref >60)
GFR calc non Af Amer: 32 mL/min — ABNORMAL LOW (ref >60)
Glucose, Bld: 127 mg/dL — ABNORMAL HIGH (ref 65–99)
Potassium: 5.1 mmol/L (ref 3.5–5.1)
Sodium: 138 mmol/L (ref 135–145)

## 2017-10-05 LAB — GLUCOSE, CAPILLARY
Glucose-Capillary: 121 mg/dL — ABNORMAL HIGH (ref 65–99)
Glucose-Capillary: 186 mg/dL — ABNORMAL HIGH (ref 65–99)
Glucose-Capillary: 113 mg/dL — ABNORMAL HIGH (ref 65–99)
Glucose-Capillary: 138 mg/dL — ABNORMAL HIGH (ref 65–99)

## 2017-10-05 LAB — CBC
HCT: 41.5 % (ref 39.0–52.0)
Hemoglobin: 13.9 g/dL (ref 13.0–17.0)
MCH: 30.7 pg (ref 26.0–34.0)
MCHC: 33.5 g/dL (ref 30.0–36.0)
MCV: 91.6 fL (ref 78.0–100.0)
Platelets: 92 10*3/uL — ABNORMAL LOW (ref 150–400)
RBC: 4.53 MIL/uL (ref 4.22–5.81)
RDW: 13.8 % (ref 11.5–15.5)
WBC: 6.9 10*3/uL (ref 4.0–10.5)

## 2017-10-05 LAB — MAGNESIUM: Magnesium: 1.8 mg/dL (ref 1.7–2.4)

## 2017-10-05 MED ORDER — DIPHENHYDRAMINE HCL 50 MG/ML IJ SOLN: 12.5000 mg | Freq: Four times a day (QID) | INTRAMUSCULAR | Status: DC | PRN

## 2017-10-05 MED ORDER — MECLIZINE HCL 25 MG PO TABS
25.0000 mg | ORAL_TABLET | Freq: Three times a day (TID) | ORAL | Status: DC | PRN
Start: 2017-10-05 — End: 2017-10-06

## 2017-10-05 MED ORDER — MAGIC MOUTHWASH
15.0000 mL | Freq: Four times a day (QID) | ORAL | Status: DC | PRN
  Filled 2017-10-05: qty 15

## 2017-10-05 MED ORDER — ENSURE SURGERY PO LIQD
237.0000 mL | Freq: Two times a day (BID) | ORAL | Status: DC
  Administered 2017-10-05 (×2): 237 mL via ORAL
  Filled 2017-10-05 (×4): qty 237

## 2017-10-05 MED ORDER — HYDROCORTISONE 1 % EX CREA
1.0000 "application " | TOPICAL_CREAM | Freq: Three times a day (TID) | CUTANEOUS | Status: DC | PRN
  Filled 2017-10-05: qty 28

## 2017-10-05 MED ORDER — ASPIRIN EC 81 MG PO TBEC
81.0000 mg | DELAYED_RELEASE_TABLET | Freq: Every day | ORAL | Status: DC
  Administered 2017-10-05: 81 mg via ORAL
  Filled 2017-10-05: qty 1

## 2017-10-05 MED ORDER — HYDRALAZINE HCL 20 MG/ML IJ SOLN: 5.0000 mg | INTRAMUSCULAR | Status: DC | PRN

## 2017-10-05 MED ORDER — ACETAMINOPHEN 500 MG PO TABS
1000.0000 mg | ORAL_TABLET | Freq: Three times a day (TID) | ORAL | Status: DC
  Administered 2017-10-05 – 2017-10-06 (×4): 1000 mg via ORAL
  Filled 2017-10-05 (×5): qty 2

## 2017-10-05 MED ORDER — METHOCARBAMOL 1000 MG/10ML IJ SOLN: 1000.0000 mg | Freq: Four times a day (QID) | INTRAVENOUS | Status: DC | PRN

## 2017-10-05 MED ORDER — PROPRANOLOL HCL 20 MG PO TABS: 10.0000 mg | ORAL_TABLET | Freq: Every day | ORAL | Status: DC

## 2017-10-05 MED ORDER — METOCLOPRAMIDE HCL 5 MG PO TABS: 5.0000 mg | ORAL_TABLET | Freq: Four times a day (QID) | ORAL | Status: DC | PRN

## 2017-10-05 MED ORDER — SPIRONOLACTONE 12.5 MG HALF TABLET
12.5000 mg | ORAL_TABLET | Freq: Every day | ORAL | Status: DC
  Administered 2017-10-05 – 2017-10-06 (×2): 12.5 mg via ORAL
  Filled 2017-10-05 (×2): qty 1

## 2017-10-05 MED ORDER — METOCLOPRAMIDE HCL 5 MG/ML IJ SOLN: 5.0000 mg | Freq: Four times a day (QID) | INTRAMUSCULAR | Status: DC | PRN

## 2017-10-05 MED ORDER — ONDANSETRON HCL 4 MG/2ML IJ SOLN: 4.0000 mg | Freq: Four times a day (QID) | INTRAMUSCULAR | Status: DC | PRN

## 2017-10-05 MED ORDER — TRAMADOL HCL 50 MG PO TABS
50.0000 mg | ORAL_TABLET | Freq: Four times a day (QID) | ORAL | Status: DC | PRN
  Administered 2017-10-05 (×2): 50 mg via ORAL
  Filled 2017-10-05 (×2): qty 1

## 2017-10-05 MED ORDER — HYDROCORTISONE 2.5 % RE CREA
1.0000 "application " | TOPICAL_CREAM | Freq: Four times a day (QID) | RECTAL | Status: DC | PRN
  Filled 2017-10-05: qty 28.35

## 2017-10-05 MED ORDER — ATORVASTATIN CALCIUM 40 MG PO TABS
80.0000 mg | ORAL_TABLET | Freq: Every day | ORAL | Status: DC
  Administered 2017-10-05: 80 mg via ORAL
  Filled 2017-10-05: qty 2

## 2017-10-05 MED ORDER — SODIUM CHLORIDE 0.9% FLUSH
3.0000 mL | Freq: Two times a day (BID) | INTRAVENOUS | Status: DC
  Administered 2017-10-05 – 2017-10-06 (×3): 3 mL via INTRAVENOUS

## 2017-10-05 MED ORDER — LOSARTAN POTASSIUM 25 MG PO TABS
25.0000 mg | ORAL_TABLET | Freq: Every day | ORAL | Status: DC
  Administered 2017-10-05 – 2017-10-06 (×2): 25 mg via ORAL
  Filled 2017-10-05 (×2): qty 1

## 2017-10-05 MED ORDER — GUAIFENESIN-DM 100-10 MG/5ML PO SYRP: 10.0000 mL | ORAL_SOLUTION | ORAL | Status: DC | PRN

## 2017-10-05 MED ORDER — SODIUM CHLORIDE 0.9% FLUSH: 3.0000 mL | INTRAVENOUS | Status: DC | PRN

## 2017-10-05 MED ORDER — LIP MEDEX EX OINT
1.0000 "application " | TOPICAL_OINTMENT | Freq: Two times a day (BID) | CUTANEOUS | Status: DC
  Administered 2017-10-05 – 2017-10-06 (×3): 1 via TOPICAL
  Filled 2017-10-05: qty 7

## 2017-10-05 MED ORDER — SODIUM CHLORIDE 0.9 % IV SOLN
8.0000 mg | Freq: Four times a day (QID) | INTRAVENOUS | Status: DC | PRN
  Filled 2017-10-05: qty 4

## 2017-10-05 MED ORDER — PSYLLIUM 95 % PO PACK
1.0000 | PACK | Freq: Every day | ORAL | Status: DC
  Administered 2017-10-05 – 2017-10-06 (×2): 1 via ORAL
  Filled 2017-10-05 (×2): qty 1

## 2017-10-05 MED ORDER — LACTATED RINGERS IV BOLUS (SEPSIS): 1000.0000 mL | Freq: Three times a day (TID) | INTRAVENOUS | Status: DC | PRN

## 2017-10-05 MED ORDER — PROPRANOLOL HCL 20 MG PO TABS
10.0000 mg | ORAL_TABLET | Freq: Every day | ORAL | Status: DC
  Administered 2017-10-05 – 2017-10-06 (×2): 10 mg via ORAL
  Filled 2017-10-05 (×2): qty 0.5

## 2017-10-05 MED ORDER — METHOCARBAMOL 500 MG PO TABS: 1000.0000 mg | ORAL_TABLET | Freq: Four times a day (QID) | ORAL | Status: DC | PRN

## 2017-10-05 MED ORDER — VITAMIN D 1000 UNITS PO TABS
2000.0000 [IU] | ORAL_TABLET | Freq: Every day | ORAL | Status: DC
  Administered 2017-10-05: 2000 [IU] via ORAL
  Filled 2017-10-05: qty 2

## 2017-10-05 MED ORDER — LORATADINE 10 MG PO TABS
10.0000 mg | ORAL_TABLET | Freq: Every day | ORAL | Status: DC
  Filled 2017-10-05 (×2): qty 1

## 2017-10-05 MED ORDER — SODIUM CHLORIDE 0.9 % IV SOLN: 250.0000 mL | INTRAVENOUS | Status: DC | PRN

## 2017-10-05 MED ORDER — TIZANIDINE HCL 4 MG PO TABS: 4.0000 mg | ORAL_TABLET | Freq: Four times a day (QID) | ORAL | Status: DC | PRN

## 2017-10-05 MED ORDER — DIPHENHYDRAMINE HCL 25 MG PO CAPS: 25.0000 mg | ORAL_CAPSULE | Freq: Four times a day (QID) | ORAL | Status: DC | PRN

## 2017-10-05 MED ORDER — ALUM & MAG HYDROXIDE-SIMETH 200-200-20 MG/5ML PO SUSP: 30.0000 mL | Freq: Four times a day (QID) | ORAL | Status: DC | PRN

## 2017-10-05 MED ORDER — SACCHAROMYCES BOULARDII 250 MG PO CAPS
250.0000 mg | ORAL_CAPSULE | Freq: Two times a day (BID) | ORAL | Status: DC
  Administered 2017-10-05 – 2017-10-06 (×3): 250 mg via ORAL
  Filled 2017-10-05 (×3): qty 1

## 2017-10-05 NOTE — Progress Notes (Signed)
[address] [place], [place] [address] Phone: [phone]  FAX: [place] [phone] 21-May-1946  CARE TEAM:  PCP: Delilah Shan, MD  Outpatient Care Team: Patient Care Team: Delilah Shan, MD as PCP - General (Family Medicine)  Inpatient Treatment Team: Treatment Team: Attending Provider: Edison Pace, Md, MD; Consulting Physician: Edison Pace, Md, MD; Consulting Physician: Alphonsa Overall, MD; Technician: Estrellita Ludwig, NT; Technician: Leda Quail, NT; Technician: Aida Raider, NT; Technician: Sueanne Margarita, NT   Problem List:   Principal Problem:   SBO (small bowel obstruction) [facility]) Active Problems:   Benign paroxysmal positional vertigo   HTN (hypertension)   HLD (hyperlipidemia)   Type 2 diabetes mellitus with nephropathy ([place])   CKD stage 3 secondary to diabetes ([place])   AKI (acute kidney injury) ([place])   GERD (gastroesophageal reflux disease)   6 Days Post-Op  09/29/2017  Procedure(s): EXPLORATORY LAPAROTOMY COLON RESECTION RIGHT   Assessment  Ileus resolving  Plan:  BPPV - restart meclizinePRN HTN - restart cozaar lower dose HLD - restart statin  DM-II - lantus and SSI, A1c 7 (1/10) Acute on CKD-3- Cr1.81 (1/15),about baseline.  Recheck Cr with restarting cozaar GERD - protonix, tums PRN DDD - home neurontin -VTE prophylaxis- SCDs, etc -mobilize as tolerated to help recovery.  GET HIM UP!!  Cecal Volvulus S/pEXPLORATORY LAPAROTOMY,COLON RESECTION RIGHT1/11 Dr. Marlou Starks  Ileus resolving.  ADAT, hold IVF.  PO pain control.  PT/OT evals  - path:FOCAL SUBMUCOSAL HEMORRHAGE AND ISCHEMIC CHANGE,BENIGN APPENDIX,TWO BENIGN LYMPH NODES,NO DYSPLASIA OR MALIGNANCY  30 minutes spent in review, evaluation, examination, counseling, and coordination of care.  More than 50% of that time was spent in counseling.  I updated the patient's status to the patient,  spouse, and nurse.  Recommendations were made.  Questions were answered.  They expressed understanding & appreciation.   D/C patient from hospital when patient meets criteria (anticipate in 1-2 day(s)):  Tolerating oral intake well Ambulating well Adequate pain control without IV medications Urinating  Having flatus Disposition planning in place   Adin Hector, M.D., F.A.C.S. Gastrointestinal and Minimally Invasive Surgery [facility] [address], [place], [place] [address] [phone] Main / Paging   10/05/2017    Subjective: (Chief complaint)  Feeling better.  Had bowel movement.  Some flatus.  Wife and nurse at bedside.  Still sore.  Tolerated full liquids.  Objective:  Vital signs:  Vitals:   10/04/17 0815 10/04/17 1405 10/04/17 2141 10/05/17 0521  BP: 131/77 (!) 141/78 (!) 148/68 (!) 143/71  Pulse: 73 77 67 73  Resp: 18 18 18 16   Temp: 98.2 F (36.8 C) 97.8 F (36.6 C) 98.1 F (36.7 C) 97.7 F (36.5 C)  TempSrc: Oral Oral Oral Oral  SpO2: 98% 98% 96% 94%  Weight:      Height:        Last BM Date: 10/04/17  Intake/Output   Yesterday:  01/16 0701 - 01/17 0700 In: 2105 [P.O.:720; I.V.:1275; IV Piggyback:110] Out: 900 [Urine:900] This shift:  No intake/output data recorded.  Bowel function:  Flatus: YES  BM:  YES  Drain: (No drain)   Physical Exam:  General: Pt awake/alert/oriented x4 in no acute distress Eyes: PERRL, normal EOM.  Sclera clear.  No icterus Neuro: CN II-XII intact w/o focal sensory/motor deficits. Lymph: No head/neck/groin lymphadenopathy Psych:  No delerium/psychosis/paranoia.  Not confused.  Alert. HENT: Normocephalic, Mucus  membranes moist.  No thrush Neck: Supple, No tracheal deviation Chest: No chest wall pain w good excursion CV:  Pulses intact.  Regular rhythm MS: Normal AROM mjr joints.  No obvious deformity  Abdomen: Soft.  Nondistended.  Mildly tender at incisions  only.  No evidence of peritonitis.  No incarcerated hernias.  Ext:  No deformity.  No mjr edema.  No cyanosis Skin: No petechiae / purpura  Results:   Labs: Results for orders placed or performed during the hospital encounter of 09/28/17 (from the past 48 hour(s))  Glucose, capillary     Status: Abnormal   Collection Time: 10/03/17 11:51 AM  Result Value Ref Range   Glucose-Capillary 133 (H) 65 - 99 mg/dL  Glucose, capillary     Status: Abnormal   Collection Time: 10/03/17  4:06 PM  Result Value Ref Range   Glucose-Capillary 122 (H) 65 - 99 mg/dL  Glucose, capillary     Status: Abnormal   Collection Time: 10/03/17 10:05 PM  Result Value Ref Range   Glucose-Capillary 124 (H) 65 - 99 mg/dL  Glucose, capillary     Status: Abnormal   Collection Time: 10/04/17  8:19 AM  Result Value Ref Range   Glucose-Capillary 123 (H) 65 - 99 mg/dL  Glucose, capillary     Status: Abnormal   Collection Time: 10/04/17 12:00 PM  Result Value Ref Range   Glucose-Capillary 138 (H) 65 - 99 mg/dL  Glucose, capillary     Status: Abnormal   Collection Time: 10/04/17  5:26 PM  Result Value Ref Range   Glucose-Capillary 121 (H) 65 - 99 mg/dL  Glucose, capillary     Status: Abnormal   Collection Time: 10/04/17  9:33 PM  Result Value Ref Range   Glucose-Capillary 160 (H) 65 - 99 mg/dL  Glucose, capillary     Status: Abnormal   Collection Time: 10/05/17  7:49 AM  Result Value Ref Range   Glucose-Capillary 121 (H) 65 - 99 mg/dL    Imaging / Studies: Dg Abd Portable 1v  Result Date: 10/04/2017 CLINICAL DATA:  Abdominal distention and pain, history of colon resection 2 days ago EXAM: PORTABLE ABDOMEN - 1 VIEW COMPARISON:  KUB of 09/29/2017 and earlier that day as well FINDINGS: Some contrast is noted within the colon. No distention of cecum or proximal small bowel is seen. Air is noted to the rectum. Surgical staples overlie the mid line of the lower pelvis. There are degenerative changes in the hips.  IMPRESSION: 1. No definite evidence of bowel obstruction is seen. Some contrast is noted throughout the colon. 2. Air is noted within the rectum. Electronically Signed   By: Ivar Drape M.D.   On: 10/04/2017 10:16    Medications / Allergies: per chart  Antibiotics: Anti-infectives (From admission, onward)   Start     Dose/Rate Route Frequency Ordered Stop   09/29/17 1519  cefoTEtan in Dextrose 5% (CEFOTAN) 2-2.08 GM-%(50ML) IVPB    Comments:  Waldron Session   : cabinet override      09/29/17 1519 09/30/17 0329        Note: Portions of this report may have been transcribed using voice recognition software. Every effort was made to ensure accuracy; however, inadvertent computerized transcription errors may be present.   Any transcriptional errors that result from this process are unintentional.     Adin Hector, M.D., F.A.C.S. Gastrointestinal and Minimally Invasive Surgery [facility] [address] [place], [place] [address] [phone]  Main / Paging   10/05/2017

## 2017-10-05 NOTE — Care Management Important Message (Signed)
Important Message  Patient Details  Name: Alex Mccarty MRN: [id number] Date of Birth: 1946-07-02   Medicare Important Message Given:  Yes    Kerin Salen 10/05/2017, 10:58 AMImportant Message  Patient Details  Name: Alex Mccarty MRN: [id number] Date of Birth: [birth year]   Medicare Important Message Given:  Yes    Kerin Salen 10/05/2017, 10:58 AM

## 2017-10-06 LAB — GLUCOSE, CAPILLARY: Glucose-Capillary: 119 mg/dL — ABNORMAL HIGH (ref 65–99)

## 2017-10-06 MED ORDER — TRAMADOL HCL 50 MG PO TABS: 50.0000 mg | ORAL_TABLET | Freq: Four times a day (QID) | ORAL | 0 refills | Status: DC | PRN

## 2017-10-06 NOTE — Progress Notes (Signed)
OT Cancellation Note  Patient Details Name: Alex Mccarty MRN: [id number] DOB: Aug 22, 1946   Cancelled Treatment:    Reason Eval/Treat Not Completed: OT screened, no needs identified, will sign off. Pt has a high toilet, walk in shower and wife can assist, if needed for adls.  Educated to work within comfort.  He has been walking and took a shower himself yesterday. Will sign off.  Mildred Tuccillo 10/06/2017, 8:26 AM  Lesle Chris, OTR/L [phone] 10/06/2017

## 2017-10-06 NOTE — Progress Notes (Signed)
Patient ID: Alex Mccarty, male   DOB: 18-Mar-1946, 72 y.o.   MRN: [id number] 7 Days Post-Op   Subjective: Had a good day yesterday.  No complaints today.  Passing flatus and had a small bowel movement yesterday.  Tolerating diet without nausea.  Was up and walking frequently in the hall yesterday.  Objective: Vital signs in last 24 hours: Temp:  [98.3 F (36.8 C)-98.9 F (37.2 C)] 98.9 F (37.2 C) (01/18 0500) Pulse Rate:  [81-84] 81 (01/18 0500) Resp:  [16] 16 (01/18 0500) BP: (137-142)/(69-84) 137/69 (01/18 0500) SpO2:  [96 %-98 %] 98 % (01/18 0500) Last BM Date: 10/04/17  Intake/Output from previous day: 01/17 0701 - 01/18 0700 In: 655 [P.O.:480; I.V.:175] Out: 1300 [Urine:1300] Intake/Output this shift: No intake/output data recorded.  General appearance: alert, cooperative and no distress GI: normal findings: soft, non-tender and Nondistended Incision/Wound: No erythema or drainage  Lab Results:  Recent Labs    10/05/17 0916  WBC 6.9  HGB 13.9  HCT 41.5  PLT 92*   BMET Recent Labs    10/05/17 0916  NA 138  K 5.1  CL 110  CO2 24  GLUCOSE 127*  BUN 22*  CREATININE 2.03*  CALCIUM 9.5     Studies/Results: Dg Abd Portable 1v  Result Date: 10/04/2017 CLINICAL DATA:  Abdominal distention and pain, history of colon resection 2 days ago EXAM: PORTABLE ABDOMEN - 1 VIEW COMPARISON:  KUB of 09/29/2017 and earlier that day as well FINDINGS: Some contrast is noted within the colon. No distention of cecum or proximal small bowel is seen. Air is noted to the rectum. Surgical staples overlie the mid line of the lower pelvis. There are degenerative changes in the hips. IMPRESSION: 1. No definite evidence of bowel obstruction is seen. Some contrast is noted throughout the colon. 2. Air is noted within the rectum. Electronically Signed   By: Ivar Drape M.D.   On: 10/04/2017 10:16    Anti-infectives: Anti-infectives (From admission, onward)   Start     Dose/Rate Route  Frequency Ordered Stop   09/29/17 1519  cefoTEtan in Dextrose 5% (CEFOTAN) 2-2.08 GM-%(50ML) IVPB    Comments:  Waldron Session   : cabinet override      09/29/17 1519 09/30/17 0329      Assessment/Plan: s/p Procedure(s): EXPLORATORY LAPAROTOMY COLON RESECTION RIGHT Doing well postoperatively without apparent complication.  Creatinine is at his normal baseline.  Okay for discharge today.   LOS: 8 days    Edward Jolly 10/06/2017

## 2017-10-06 NOTE — Progress Notes (Signed)
Patient is alert and oriented, discharge instructions reviewed with pt and spouse, iv removed, pain controlled and diet tolerated well, Neta Mends RN 10:47 AM 10-06-17

## 2017-10-06 NOTE — Progress Notes (Signed)
PT Cancellation Note / Screen  Patient Details Name: Alex Mccarty MRN: [id number] DOB: [birth year]   Cancelled Treatment:    Reason Eval/Treat Not Completed: PT screened, no needs identified, will sign off Per OT note and staff, pt is up independently.  Also d/c paperwork in place.  No acute PT needs identified at this time.   Dainelle Hun,KATHrine E 10/06/2017, 9:24 AM Carmelia Bake, PT, DPT 10/06/2017  Pager: [phone]

## 2017-10-06 NOTE — Discharge Summary (Signed)
Patient ID: Alex Mccarty [id number] 72 y.o. 06/07/1946  09/28/2017  Discharge date and time: 10/06/2017   Admitting Physician: Autumn Messing  Discharge Physician: Edward Jolly  Admission Diagnoses: SBO   Discharge Diagnoses: Cecal volvulus  Operations: Procedure(s): EXPLORATORY LAPAROTOMY COLON RESECTION RIGHT  Admission Condition: poor  Discharged Condition: good  Indication for Admission: He was doing well until yesterday AM.  He had two unusually large BM's.  But then started having abdominal swelling and pain.  He took some Gas-X, which did not help.  He last passed flatus yesterday morning.             He has no prior history of abdominal surgery.  He has no stomach, liver, pancreas, or colon disease.  He had a negative colonoscopy in 2013 in [place].  He is unsure of the gastroenterologist.             He went to [place] today because of increasing abdominal pain and distention.  CT scan showed evidence of small bowel obstruction and he was admitted for further treatment.    Hospital Course: Patient was treated initially with NG suction and IV fluids and symptomatic management.  All bowel protocol was obtained showing ongoing obstruction and concern was raised by radiology for cecal volvulus.  He was taken to the operating room on January 11 by Dr. Marlou Starks with findings of cecal volvulus and underwent a right colectomy.  Initially had some expected pain and nausea and postoperative ileus.  X-ray was consistent with ileus.  He was started on Reglan.  He had some mild delirium that resolved.  By 116 he began having flatus.  Continued to improve and NG tube was removed.  Started on clear liquid diet and advance to a soft diet over the next couple of days.  At the time of discharge he is ambulatory.  Minimal discomfort.  No nausea.  Having flatus and a small bowel movement.  Abdomen is benign is felt ready for discharge.  Consults: Internal medicine  hospitalist   Disposition: Home  Patient Instructions:  Allergies as of 10/06/2017      Reactions   Codeine Nausea And Vomiting   Penicillins Other (See Comments)   Childhood reaction Has patient had a PCN reaction causing immediate rash, facial/tongue/throat swelling, SOB or lightheadedness with hypotension: Unknown Has patient had a PCN reaction causing severe rash involving mucus membranes or skin necrosis: Unknown Has patient had a PCN reaction that required hospitalization: Unknown Has patient had a PCN reaction occurring within the last 10 years: Unknown If all of the above answers are "NO", then may proceed with Cephalosporin use.   Sulfa Antibiotics Nausea And Vomiting      Medication List    TAKE these medications   ASPIRIN LOW DOSE 81 MG EC tablet Generic drug:  aspirin Take 81 mg by mouth at bedtime.   atorvastatin 80 MG tablet Commonly known as:  LIPITOR Take 80 mg by mouth at bedtime.   cetirizine 10 MG tablet Commonly known as:  ZYRTEC Take 10 mg by mouth daily as needed.   COSAMIN DS PO Take 1 capsule by mouth at bedtime.   cyclobenzaprine 10 MG tablet Commonly known as:  FLEXERIL Take 10 mg by mouth 3 (three) times daily as needed (muscle pain).   gabapentin 300 MG capsule Commonly known as:  NEURONTIN Take 300 mg by mouth at bedtime.   LANTUS SOLOSTAR 100 UNIT/ML Solostar Pen Generic drug:  Insulin Glargine  Inject 40 Units into the skin every morning.   losartan 100 MG tablet Commonly known as:  COZAAR Take 100 mg by mouth daily.   meclizine 25 MG tablet Commonly known as:  ANTIVERT Take 1 tablet (25 mg total) by mouth 3 (three) times daily as needed for dizziness. What changed:  additional instructions   potassium citrate 10 MEQ (1080 MG) SR tablet Commonly known as:  UROCIT-K Take 15 mEq by mouth daily before breakfast.   propranolol 10 MG tablet Commonly known as:  INDERAL Take 10 mg by mouth daily.   spironolactone 25 MG  tablet Commonly known as:  ALDACTONE Take 12.5 mg by mouth daily.   tiZANidine 4 MG tablet Commonly known as:  ZANAFLEX Take 4 mg by mouth every 6 (six) hours as needed for muscle spasms.   traMADol 50 MG tablet Commonly known as:  ULTRAM Take 1-2 tablets (50-100 mg total) by mouth every 6 (six) hours as needed for moderate pain or severe pain.   Vitamin D 2000 units tablet Take 2,000 Units by mouth at bedtime.       Activity: no heavy lifting for 4 weeks Diet: regular diet Wound Care: none needed  Follow-up:  With [facility] in 1 week.  Signed: Edward Jolly MD, FACS  10/06/2017, 7:29 AM

## 2017-11-08 DIAGNOSIS — E113393 Type 2 diabetes mellitus with moderate nonproliferative diabetic retinopathy without macular edema, bilateral: Secondary | ICD-10-CM | POA: Diagnosis not present

## 2017-11-08 DIAGNOSIS — Z961 Presence of intraocular lens: Secondary | ICD-10-CM | POA: Diagnosis not present

## 2017-11-08 DIAGNOSIS — H35033 Hypertensive retinopathy, bilateral: Secondary | ICD-10-CM | POA: Diagnosis not present

## 2017-12-07 ENCOUNTER — Ambulatory Visit: Payer: PPO | Admitting: Cardiology

## 2018-02-07 ENCOUNTER — Other Ambulatory Visit: Payer: Self-pay | Admitting: General Surgery

## 2018-02-07 DIAGNOSIS — K562 Volvulus: Secondary | ICD-10-CM

## 2018-02-07 DIAGNOSIS — N6310 Unspecified lump in the right breast, unspecified quadrant: Secondary | ICD-10-CM

## 2018-02-13 DIAGNOSIS — M5416 Radiculopathy, lumbar region: Secondary | ICD-10-CM | POA: Diagnosis not present

## 2018-02-13 DIAGNOSIS — M25859 Other specified joint disorders, unspecified hip: Secondary | ICD-10-CM | POA: Diagnosis not present

## 2018-02-13 DIAGNOSIS — E1122 Type 2 diabetes mellitus with diabetic chronic kidney disease: Secondary | ICD-10-CM | POA: Diagnosis not present

## 2018-02-13 DIAGNOSIS — M4722 Other spondylosis with radiculopathy, cervical region: Secondary | ICD-10-CM | POA: Diagnosis not present

## 2018-02-13 DIAGNOSIS — M25559 Pain in unspecified hip: Secondary | ICD-10-CM | POA: Diagnosis not present

## 2018-02-19 ENCOUNTER — Ambulatory Visit
Admission: RE | Admit: 2018-02-19 | Discharge: 2018-02-19 | Disposition: A | Discharge status: Home / Self Care | Payer: PPO | Source: Ambulatory Visit | Attending: General Surgery | Admitting: General Surgery

## 2018-02-19 ENCOUNTER — Other Ambulatory Visit: Payer: Self-pay | Admitting: General Surgery

## 2018-02-19 DIAGNOSIS — K562 Volvulus: Secondary | ICD-10-CM

## 2018-02-19 DIAGNOSIS — R928 Other abnormal and inconclusive findings on diagnostic imaging of breast: Secondary | ICD-10-CM | POA: Diagnosis not present

## 2018-02-19 DIAGNOSIS — N6310 Unspecified lump in the right breast, unspecified quadrant: Secondary | ICD-10-CM

## 2018-02-19 DIAGNOSIS — R109 Unspecified abdominal pain: Secondary | ICD-10-CM | POA: Diagnosis not present

## 2018-02-19 DIAGNOSIS — N644 Mastodynia: Secondary | ICD-10-CM | POA: Diagnosis not present

## 2018-03-06 DIAGNOSIS — Z961 Presence of intraocular lens: Secondary | ICD-10-CM | POA: Diagnosis not present

## 2018-03-06 DIAGNOSIS — E113393 Type 2 diabetes mellitus with moderate nonproliferative diabetic retinopathy without macular edema, bilateral: Secondary | ICD-10-CM | POA: Diagnosis not present

## 2018-04-25 IMAGING — CT CT ABD-PELV W/O CM
2 of 4 series · 14 of 46 positions shown, 16 images · non-contrast
Comparison: CT of the abdomen 02/14/2015. MRI of the lumbar spine
07/03/2017. CT of the abdomen and pelvis 03/09/2013

ADDENDUM:
A dilated loop of distal small bowel present in the mid abdomen.
This measures up to 9.1 cm in transverse diameter. More distal small
bowel and colon are collapsed.

This represents a focal distal small bowel obstruction without mass.
Given that the dilated bowel extends anterior to the colon, internal
hernia should be considered.
These results were discussed by telephone on 09/28/2017 at [DATE]
with Dr. IMRAN HASAN LIGHTING .
CLINICAL DATA: Abdominal pain and distention. Nausea beginning at 9
a.m. yesterday.
EXAM:
CT ABDOMEN AND PELVIS WITHOUT CONTRAST
TECHNIQUE: Multidetector CT imaging of the abdomen and pelvis was performed
following the standard protocol without IV contrast.

[Series 2: axial st · axial · 0.96mm/px · z∈[-598,-118]mm · 11 of 116 slices shown, 13 images]
[im 10/116  soft-tissue]
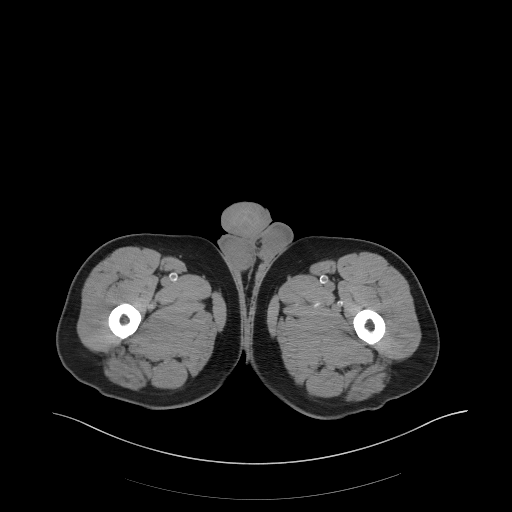
[im 10/116  bone]
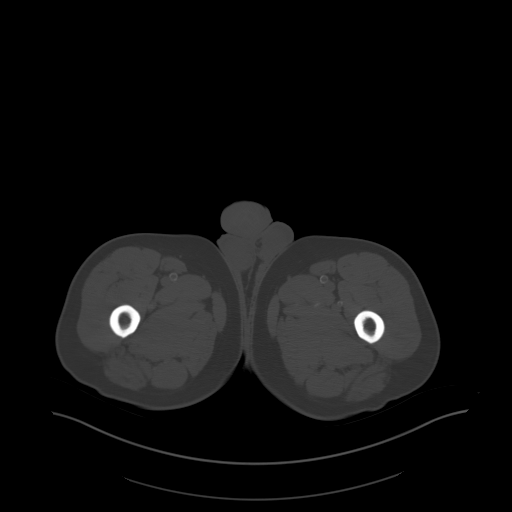
[im 20/116  soft-tissue]
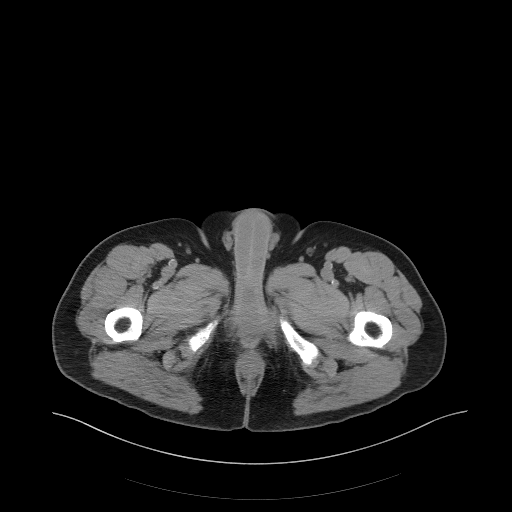
[im 29/116  soft-tissue]
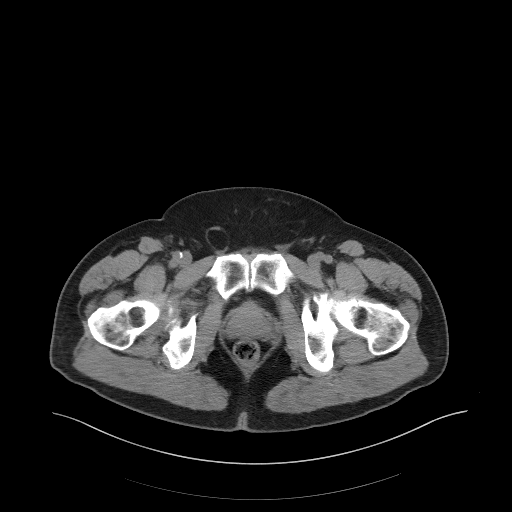
[im 39/116  soft-tissue]
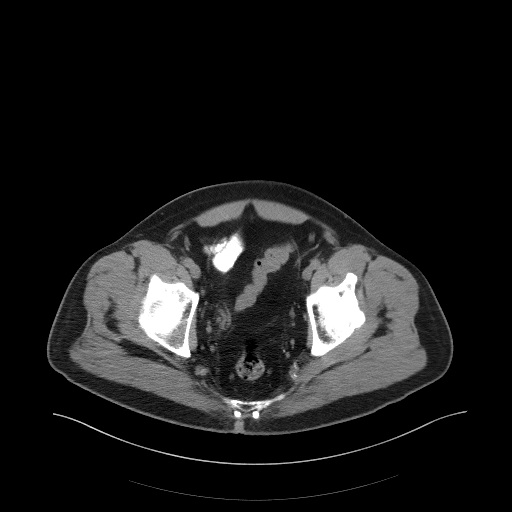
[im 48/116  soft-tissue]
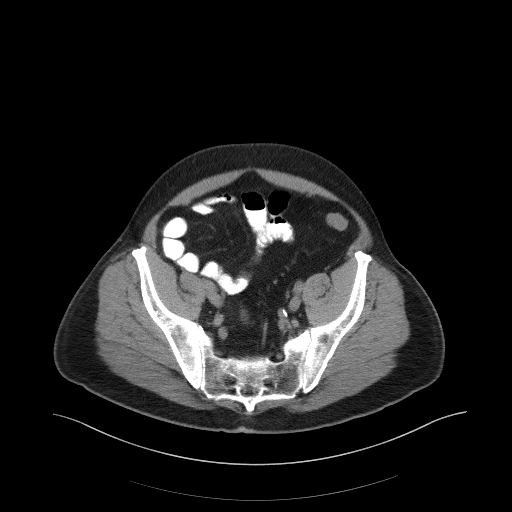
[im 58/116  soft-tissue]
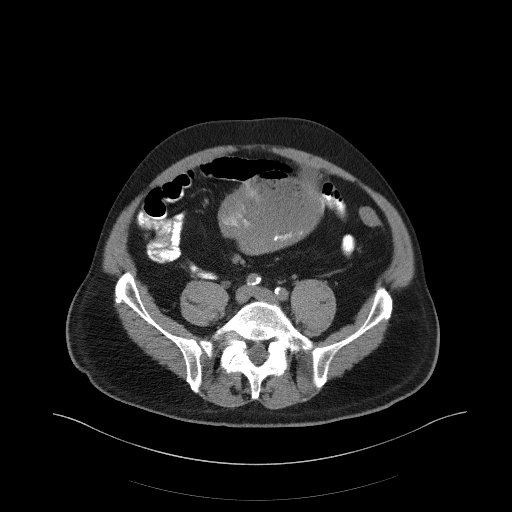
[im 68/116  soft-tissue]
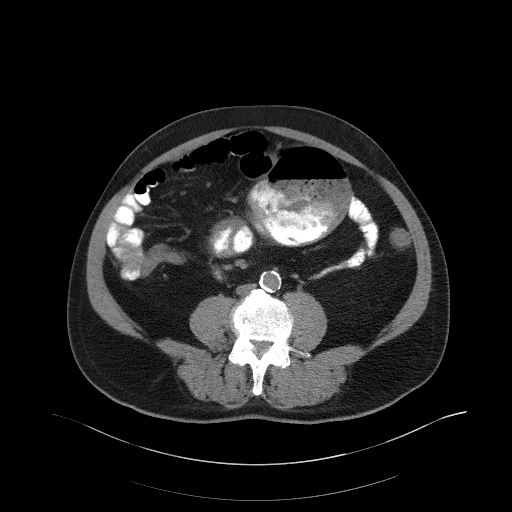
[im 77/116  soft-tissue]
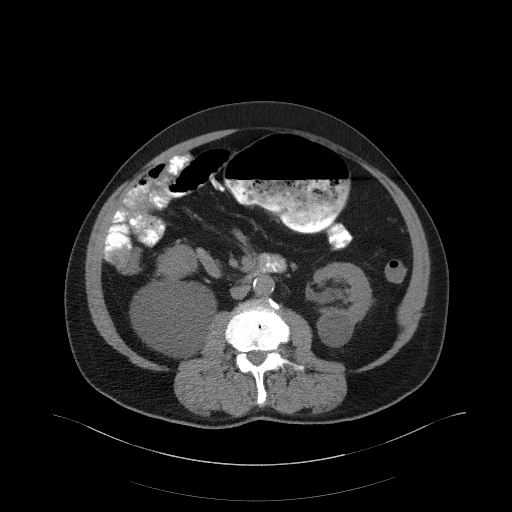
[im 87/116  soft-tissue]
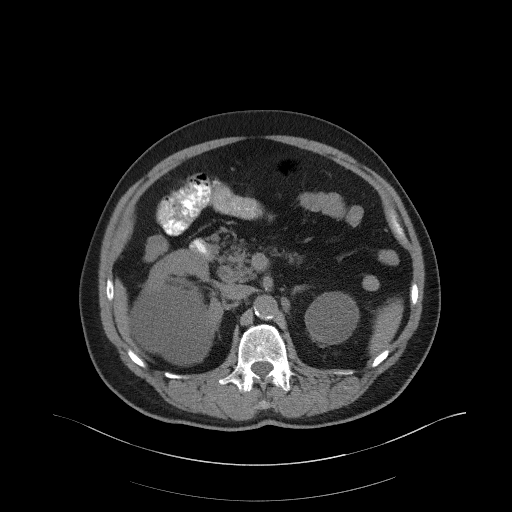
[im 87/116  bone]
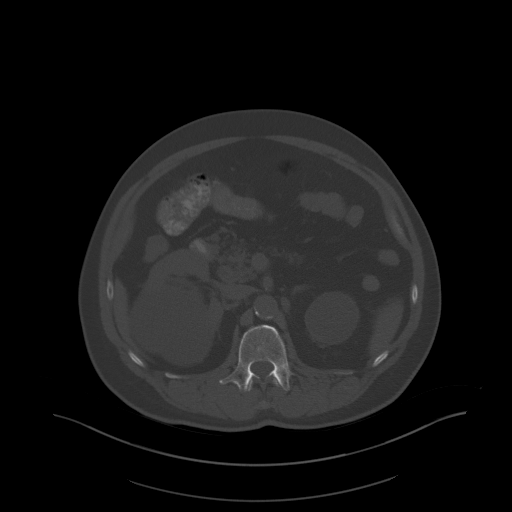
[im 96/116  soft-tissue]
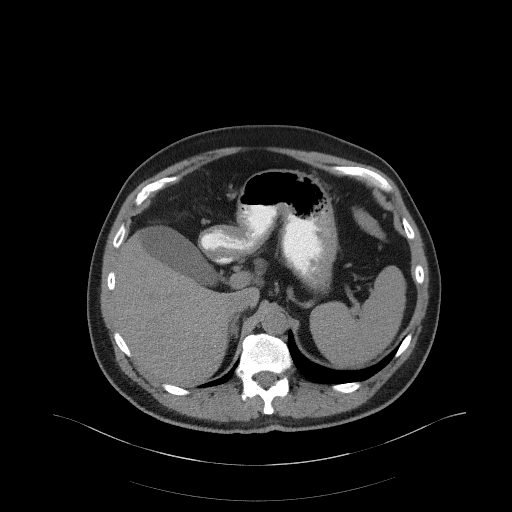
[im 106/116  soft-tissue]
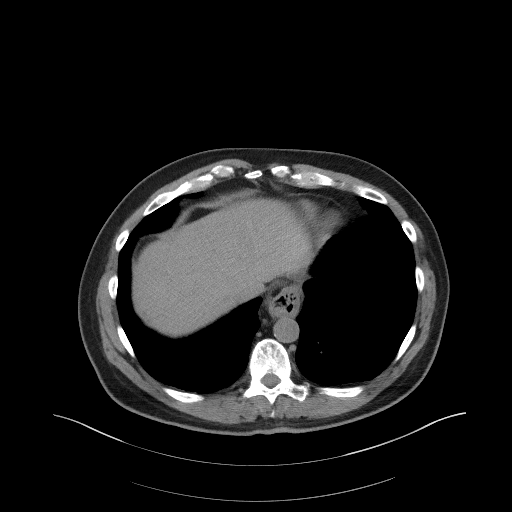

[Series 5: coronal st · coronal · 0.83mm/px · 3 of 109 slices shown]
[im 37/109  soft-tissue]
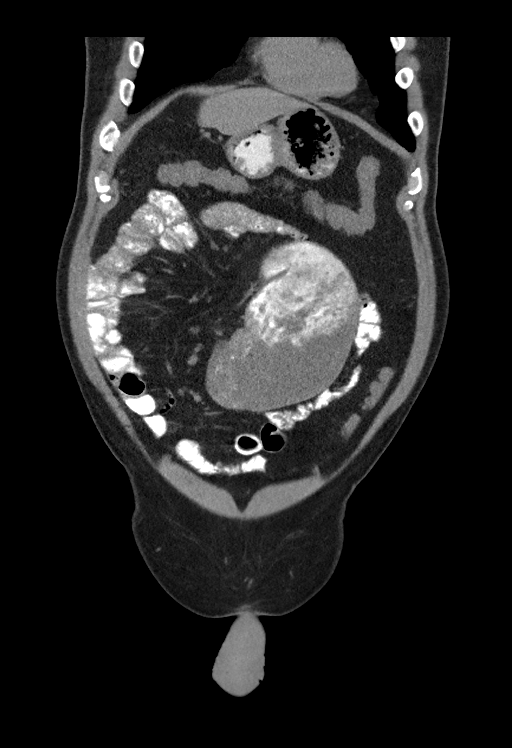
[im 49/109  soft-tissue]
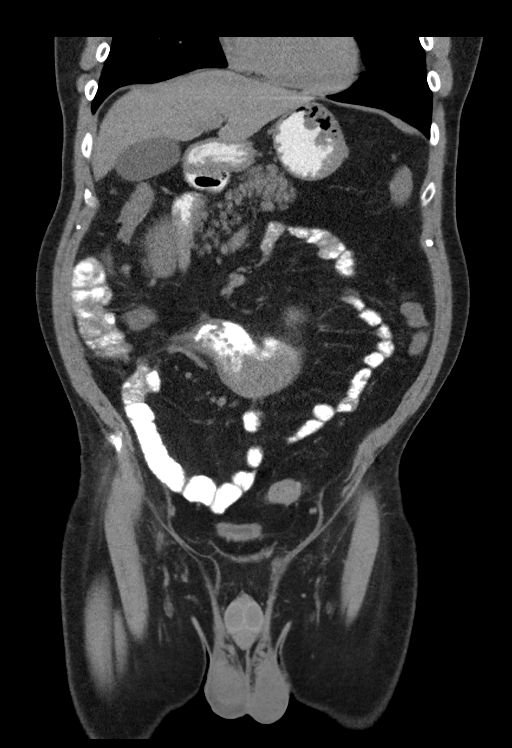
[im 61/109  soft-tissue]
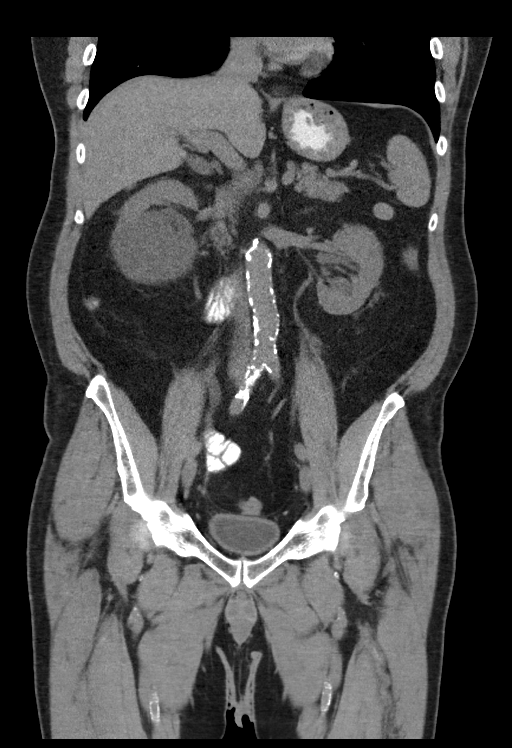

[14 of 46 positions shown; findings below may reference images not displayed]

FINDINGS: Lower chest: The lung bases are clear without focal nodule, mass, or
airspace disease. The a the heart size is normal. A small hiatal
hernia is present.

Hepatobiliary: No focal liver abnormality is seen. No gallstones,
gallbladder wall thickening, or biliary dilatation.

Pancreas: Atrophy is stable.  No discrete mass lesion is present.

Spleen: Normal in size without focal abnormality.

Adrenals/Urinary Tract: Adrenal glands are normal bilaterally.
Multiple bilateral renal cysts are stable. There is a complex cyst
at the lower pole of the left kidney which measures 2.7 x 2.2 x
cm. This lesion is slightly more prominent than on prior studies.
Recommend follow-up MRI with and without contrast further
evaluation. Given the patient's renal insufficiency, a partial dose
of MultiHance could be administered.

Stomach/Bowel: Stomach and duodenum are within normal limits. Small
bowel is unremarkable. The terminal ileum is within normal limits.
The ascending and transverse colon are mostly collapsed. The
descending and sigmoid colon are normal.

Vascular/Lymphatic: Dense atherosclerotic calcifications are present
within the aorta and branch vessels without aneurysm.

Reproductive: Prostate is unremarkable.

Other: Fat herniates into the inguinal canals bilaterally. There is
no associated bowel. A small umbilical hernia contains fat. There is
no herniation of bowel. No obstruction is present. No free fluid or
free air is present.

Musculoskeletal: S-shaped curvature of the lumbar spine is convex to
the right at L2 and to the left at L4-5 associated endplate changes
are present. AP alignment is anatomic. No focal lytic or blastic
lesions are present. Degenerative changes are noted in the
sacroiliac joints bilaterally. Pelvis is otherwise within normal
limits. The hips are located.
IMPRESSION: 1. No acute or focal lesion to account for the patient's symptoms of
abdominal pain, distention, or nausea.
2. Complex cyst at the lower pole of the left kidney demonstrates
some interval growth since 7602. Recommend follow-up MRI of the
abdomen without and with contrast for further evaluation. Given the
patient's renal insufficiency, a partial dose of MultiHance could be
administered.
3. Fat herniates into the inguinal canals bilaterally without
associated bowel.
4. Degenerative changes and scoliosis of the lumbar spine.

## 2018-04-25 IMAGING — DX DG ABD PORTABLE 1V
1 series · 1 of 1 positions shown · non-contrast
Comparison: CT 09/28/2017.

CLINICAL DATA: NG tube placement.

EXAM:
PORTABLE ABDOMEN - 1 VIEW

[abdomen kub]
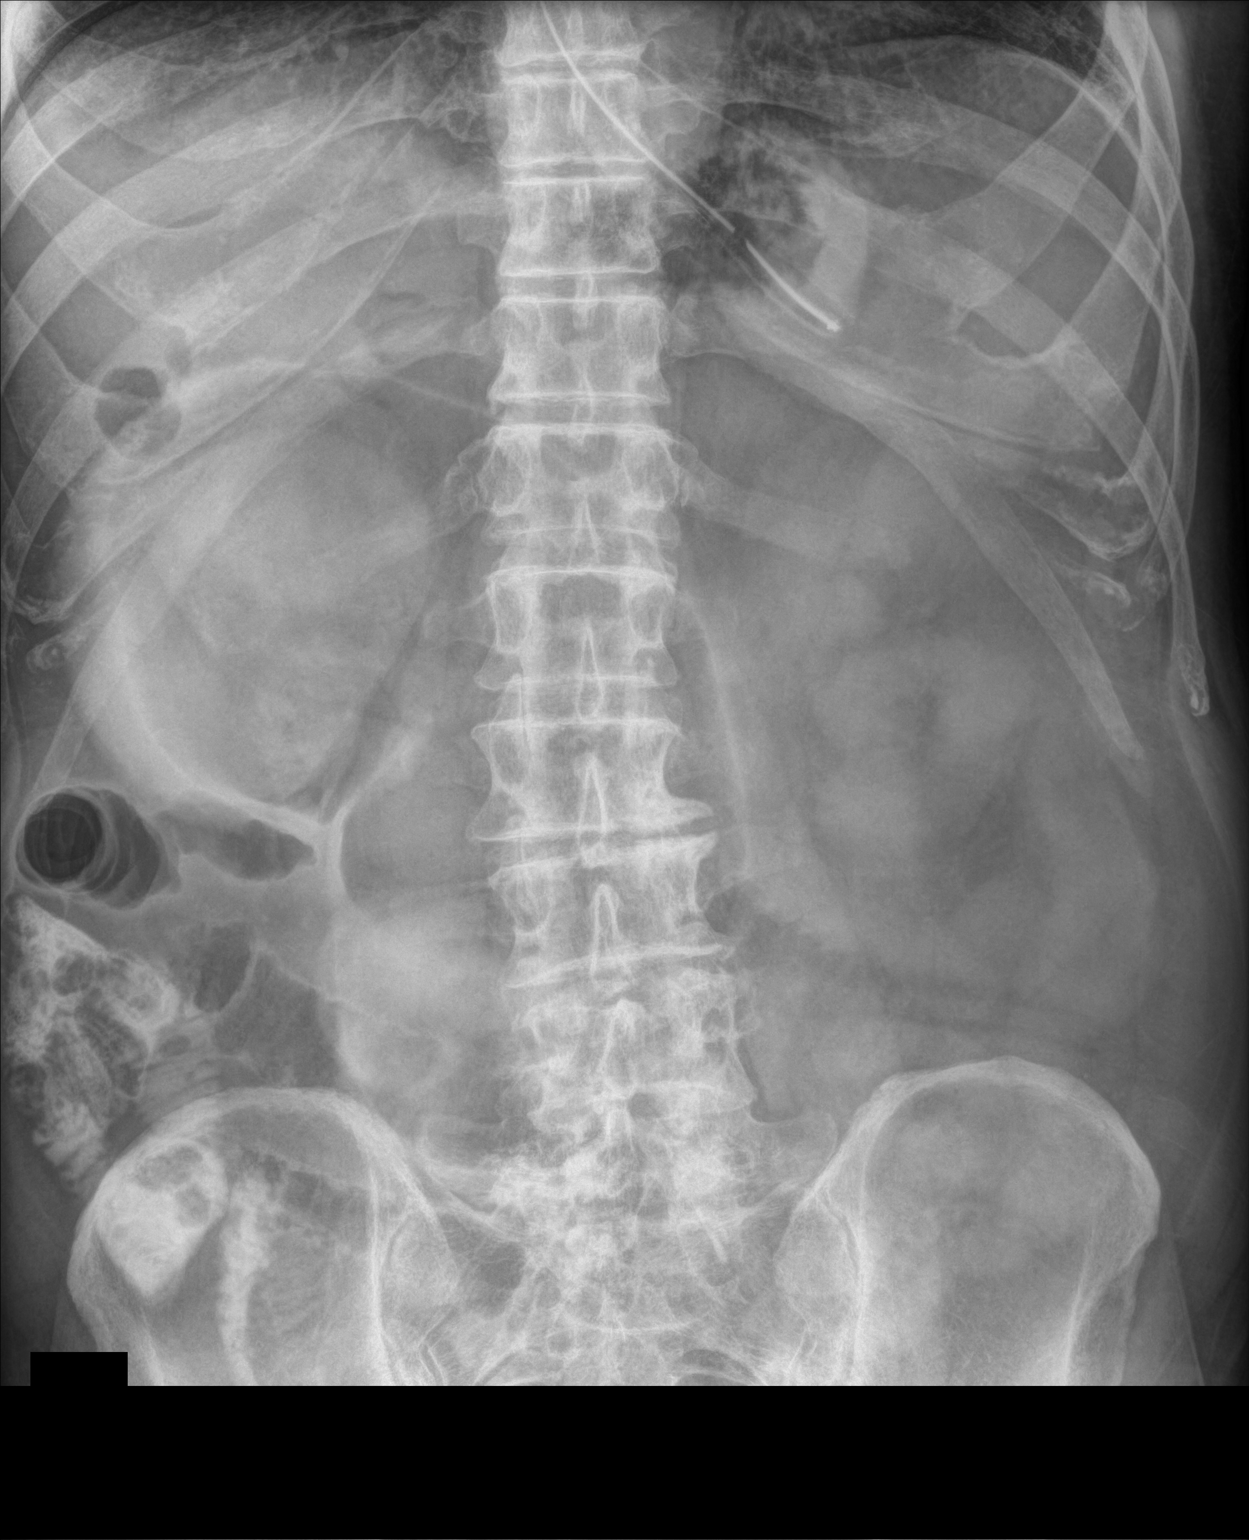

[1 of 1 positions shown; findings below may reference images not displayed]

FINDINGS: NG tube noted with tip below left hemidiaphragm. Side hole is at the
gastroesophageal junction. Advancement of approximately 10 cm should
be considered. Air-filled loops of small bowel containing oral
contrast from prior CT noted. Follow-up abdominal series suggested
for continued evaluation. No acute bony abnormality. Mild lumbar
spine scoliosis.
IMPRESSION: 1. NG tube noted with tip below left hemidiaphragm. Side-hole is a
gastroesophageal junction. Advancement of approximately 10 cm should
be considered.

2. Air-filled loops of small bowel containing oral contrast noted
from prior CT. Follow-up abdominal series suggested for continued
evaluation.

## 2018-04-25 IMAGING — DX DG ABD PORTABLE 1V
1 series · 1 of 1 positions shown · non-contrast
Comparison: One-view abdomen from the same day at [DATE] p.m..

CLINICAL DATA: NG tube placement.  Bowel obstruction.

EXAM:
PORTABLE ABDOMEN - 1 VIEW

[abdomen kub]
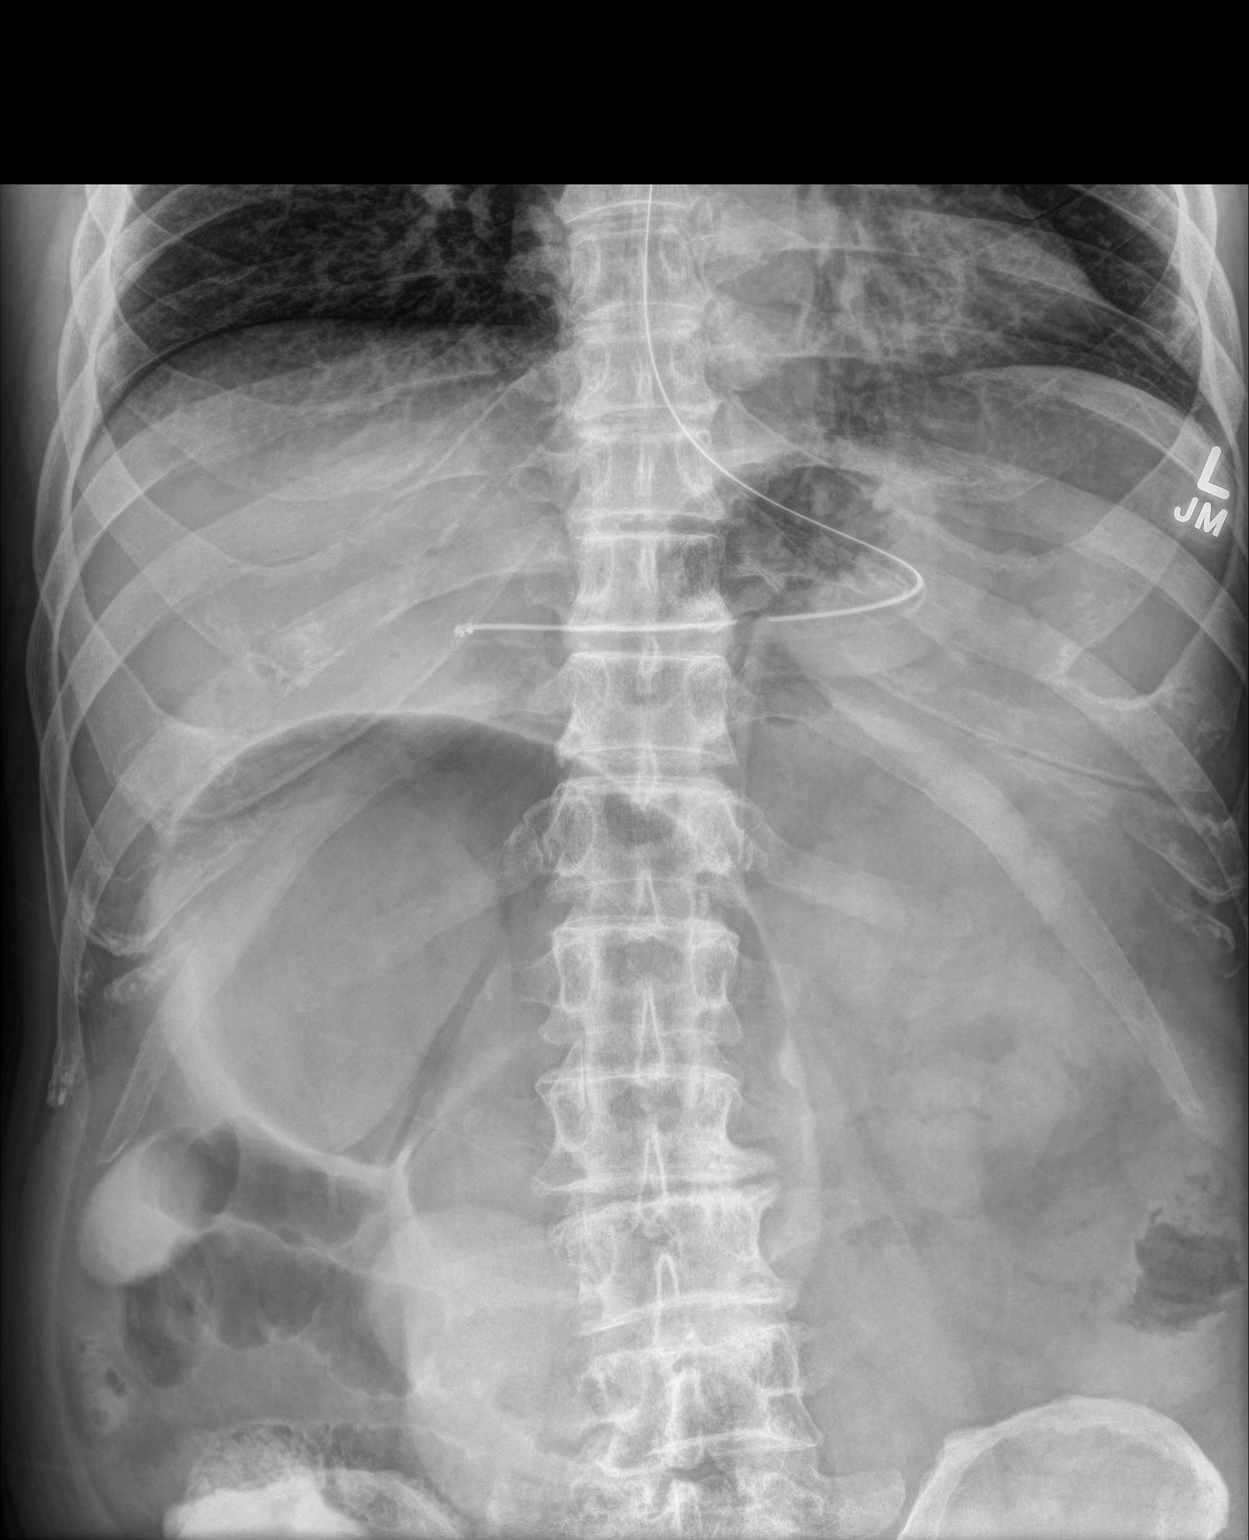

[1 of 1 positions shown; findings below may reference images not displayed]

FINDINGS: The NG tube has been advanced. The side port is now in the stomach.
The lung bases are clear. The distended loop of distal small bowel
is again noted. The stomach is decompressed. There is no free air.
Degenerative changes are again noted in the lumbar spine.
IMPRESSION: 1. The side port of the NG tube is now in the stomach with
decompression of the stomach.
2. Dilated loop of distal small bowel is unchanged.

## 2018-06-14 DIAGNOSIS — M5416 Radiculopathy, lumbar region: Secondary | ICD-10-CM | POA: Diagnosis not present

## 2018-06-14 DIAGNOSIS — M4722 Other spondylosis with radiculopathy, cervical region: Secondary | ICD-10-CM | POA: Diagnosis not present

## 2018-06-14 DIAGNOSIS — E1122 Type 2 diabetes mellitus with diabetic chronic kidney disease: Secondary | ICD-10-CM | POA: Diagnosis not present

## 2018-06-14 DIAGNOSIS — M1611 Unilateral primary osteoarthritis, right hip: Secondary | ICD-10-CM | POA: Diagnosis not present

## 2018-06-14 DIAGNOSIS — I10 Essential (primary) hypertension: Secondary | ICD-10-CM | POA: Diagnosis not present

## 2018-06-14 DIAGNOSIS — Z794 Long term (current) use of insulin: Secondary | ICD-10-CM | POA: Diagnosis not present

## 2018-06-14 DIAGNOSIS — M5136 Other intervertebral disc degeneration, lumbar region: Secondary | ICD-10-CM | POA: Diagnosis not present

## 2018-11-06 DIAGNOSIS — G459 Transient cerebral ischemic attack, unspecified: Secondary | ICD-10-CM | POA: Diagnosis not present

## 2018-11-06 DIAGNOSIS — R251 Tremor, unspecified: Secondary | ICD-10-CM | POA: Diagnosis not present

## 2018-11-06 DIAGNOSIS — E7800 Pure hypercholesterolemia, unspecified: Secondary | ICD-10-CM | POA: Diagnosis not present

## 2018-11-06 DIAGNOSIS — E1122 Type 2 diabetes mellitus with diabetic chronic kidney disease: Secondary | ICD-10-CM | POA: Diagnosis not present

## 2018-11-26 DIAGNOSIS — M5136 Other intervertebral disc degeneration, lumbar region: Secondary | ICD-10-CM | POA: Diagnosis not present

## 2018-11-26 DIAGNOSIS — M4722 Other spondylosis with radiculopathy, cervical region: Secondary | ICD-10-CM | POA: Diagnosis not present

## 2018-11-26 DIAGNOSIS — M1611 Unilateral primary osteoarthritis, right hip: Secondary | ICD-10-CM | POA: Diagnosis not present

## 2018-11-26 DIAGNOSIS — M5416 Radiculopathy, lumbar region: Secondary | ICD-10-CM | POA: Diagnosis not present

## 2018-11-30 DIAGNOSIS — M1611 Unilateral primary osteoarthritis, right hip: Secondary | ICD-10-CM | POA: Diagnosis not present

## 2018-12-05 ENCOUNTER — Other Ambulatory Visit: Payer: Self-pay

## 2018-12-05 NOTE — Patient Outreach (Signed)
  [facility]) Care Management Chronic Special Needs Program  12/05/2018  Name: Alex Mccarty DOB: [birth year]  MRN: [id number]  Mr. Cheron Pasquarelli is enrolled in a chronic special needs plan for Diabetes. Client called with no answer No answer and HIPAA compliant message left. Plan for 2d outreach call in 2-3 days Chronic care management coordinator will attempt outreach in 2-3 days .   Peter Garter RN, Jackquline Denmark, CDE Chronic Care Management Coordinator [facility] [phone]

## 2018-12-07 ENCOUNTER — Other Ambulatory Visit: Payer: Self-pay

## 2018-12-07 NOTE — Patient Outreach (Signed)
  [facility]) Care Management Chronic Special Needs Program  12/07/2018  Name: Alex Mccarty DOB: [birth year]  MRN: [id number]  Mr. Alex Mccarty is enrolled in a chronic special needs plan for Diabetes. Chronic Care Management Coordinator telephoned client to review health risk assessment and to develop individualized care plan.  Introduced the chronic care management program, importance of client participation, and taking their care plan to all provider appointments and inpatient facilities.  Reviewed the transition of care process and possible referral to community care management.  Subjective: Client states he has only been to the ED once in the last 6 months when he had a TIA.  States they put him on Plavix and he has not had any problem since.  States he also goes to the [place] for care and he gets some of his medications from them.  States his blood sugars usually range from 100-135 in the morning.  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  States he does walk sometimes  Goals Addressed            This Visit's Progress   .  Diabetes Patient stated goal to keep morning blood  sugar between 100-110  (pt-stated)      . Client understands the importance of follow-up with providers by attending scheduled visits      . Client will not report change from baseline and no repeated symptoms of TIA with in the next 9 months       . Client will use Assistive Devices as needed and verbalize understanding of device use      . Client will verbalize knowledge of self management of Hypertension as evidences by BP reading of 140/90 or less; or as defined by provider      . HEMOGLOBIN A1C < 7.0       Diabetes self management actions:  Glucose monitoring per provider recommendations  Perform Quality checks on blood meter  Eat Healthy  Check feet daily  Visit provider every 3-6 months as directed  Hbg A1C level every 3-6 months.  Eye Exam yearly    . Maintain timely refills of diabetic medication as prescribed within the year .      [name] Obtain annual  Lipid Profile, LDL-C      . Obtain Annual Eye (retinal)  Exam       . Obtain Annual Foot Exam      . Obtain annual screen for micro albuminuria (urine) , nephropathy (kidney problems)      . Obtain Hemoglobin A1C at least 2 times per year      . Visit Primary Care Provider or Endocrinologist at least 2 times per year        Client is meeting diabetes self management goal of hemoglobin A1C of <7% with last reading of 6.9%.  Instructed to try to walk after dinner to help lower his morning CBG.  Plan:  Send successful outreach letter with a copy of their individualized care plan, Send individual care plan to provider and Send educational material- HTN, TIA  Chronic care management coordination will outreach in:  9 Months    Peter Garter RN, [employer], [place] Management Coordinator [place] Management [phone]

## 2019-01-17 MED ORDER — INSULIN LISPRO 100 UNIT/ML ~~LOC~~ SOLN
1.00 | SUBCUTANEOUS | Status: DC
Start: ? — End: 2019-01-17

## 2019-01-17 MED ORDER — CLOPIDOGREL BISULFATE 75 MG PO TABS
75.00 | ORAL_TABLET | ORAL | Status: DC
Start: 2019-01-17 — End: 2019-01-17

## 2019-01-17 MED ORDER — ASPIRIN EC 81 MG PO TBEC
81.00 | DELAYED_RELEASE_TABLET | ORAL | Status: DC
Start: 2019-01-17 — End: 2019-01-17

## 2019-01-17 MED ORDER — ENOXAPARIN SODIUM 40 MG/0.4ML ~~LOC~~ SOLN
40.00 | SUBCUTANEOUS | Status: DC
Start: 2019-01-16 — End: 2019-01-17

## 2019-01-17 MED ORDER — HYDRALAZINE HCL 20 MG/ML IJ SOLN
10.00 | INTRAMUSCULAR | Status: DC
Start: ? — End: 2019-01-17

## 2019-01-17 MED ORDER — SODIUM CHLORIDE 0.9 % IV SOLN
25.00 | INTRAVENOUS | Status: DC
Start: ? — End: 2019-01-17

## 2019-01-17 MED ORDER — GENERIC EXTERNAL MEDICATION
Status: DC
Start: ? — End: 2019-01-17

## 2019-01-17 MED ORDER — PANTOPRAZOLE SODIUM 40 MG PO TBEC
40.00 | DELAYED_RELEASE_TABLET | ORAL | Status: DC
Start: 2019-01-17 — End: 2019-01-17

## 2019-01-17 MED ORDER — GABAPENTIN 300 MG PO CAPS
300.00 | ORAL_CAPSULE | ORAL | Status: DC
Start: 2019-01-16 — End: 2019-01-17

## 2019-01-17 MED ORDER — INSULIN GLARGINE 100 UNIT/ML ~~LOC~~ SOLN
1.00 | SUBCUTANEOUS | Status: DC
Start: ? — End: 2019-01-17

## 2019-01-17 MED ORDER — LABETALOL HCL 5 MG/ML IV SOLN
10.00 | INTRAVENOUS | Status: DC
Start: ? — End: 2019-01-17

## 2019-01-17 MED ORDER — INSULIN GLARGINE 100 UNIT/ML ~~LOC~~ SOLN
1.00 | SUBCUTANEOUS | Status: DC
Start: 2019-01-16 — End: 2019-01-17

## 2019-01-17 MED ORDER — INSULIN LISPRO 100 UNIT/ML ~~LOC~~ SOLN
1.00 | SUBCUTANEOUS | Status: DC
Start: 2019-01-16 — End: 2019-01-17

## 2019-01-17 MED ORDER — ONDANSETRON HCL 4 MG/2ML IJ SOLN
4.00 | INTRAMUSCULAR | Status: DC
Start: ? — End: 2019-01-17

## 2019-01-17 MED ORDER — ATORVASTATIN CALCIUM 80 MG PO TABS
80.00 | ORAL_TABLET | ORAL | Status: DC
Start: 2019-01-17 — End: 2019-01-17

## 2019-01-18 ENCOUNTER — Other Ambulatory Visit: Payer: Self-pay

## 2019-01-18 MED ORDER — GENERIC EXTERNAL MEDICATION
Status: DC
Start: ? — End: 2019-01-18

## 2019-01-18 NOTE — Patient Outreach (Signed)
  [facility]) Care Management Chronic Special Needs Program    01/18/2019  Name: Alex Mccarty, DOB: 1945-12-14  MRN: [id number]   Mr. Alex Mccarty is enrolled in a chronic special needs plan for Diabetes. Client called to follow up from observational stay at [facility] 01/15/19-01/16/19 for TIAs.   Client states that he started having numbness in his rt hand, shoulder and face in episodes that would last 2-5 minutes.  States it was similar to the TIAs he had before.  States that they did tests but could not find anything.  States that they told him it could be degenerative disc in his neck. States [facility] for follow up visits and he is awaiting them to call him back.  Denies any changes in his medications. Instructed on s/s to call his MD and to keep follow up appts. Plan to outreach on next scheduled call    Peter Garter RN, Jackquline Denmark, [place] Management [phone]

## 2019-03-04 DIAGNOSIS — E1122 Type 2 diabetes mellitus with diabetic chronic kidney disease: Secondary | ICD-10-CM | POA: Diagnosis not present

## 2019-03-04 DIAGNOSIS — N183 Chronic kidney disease, stage 3 (moderate): Secondary | ICD-10-CM | POA: Diagnosis not present

## 2019-03-04 DIAGNOSIS — M1611 Unilateral primary osteoarthritis, right hip: Secondary | ICD-10-CM | POA: Diagnosis not present

## 2019-03-04 DIAGNOSIS — J984 Other disorders of lung: Secondary | ICD-10-CM | POA: Diagnosis not present

## 2019-03-04 DIAGNOSIS — Z01818 Encounter for other preprocedural examination: Secondary | ICD-10-CM | POA: Diagnosis not present

## 2019-03-04 DIAGNOSIS — I129 Hypertensive chronic kidney disease with stage 1 through stage 4 chronic kidney disease, or unspecified chronic kidney disease: Secondary | ICD-10-CM | POA: Diagnosis not present

## 2019-03-07 DIAGNOSIS — R7309 Other abnormal glucose: Secondary | ICD-10-CM | POA: Diagnosis not present

## 2019-03-07 DIAGNOSIS — M1611 Unilateral primary osteoarthritis, right hip: Secondary | ICD-10-CM | POA: Diagnosis not present

## 2019-03-19 DIAGNOSIS — M13859 Other specified arthritis, unspecified hip: Secondary | ICD-10-CM | POA: Diagnosis not present

## 2019-03-19 DIAGNOSIS — Z1159 Encounter for screening for other viral diseases: Secondary | ICD-10-CM | POA: Diagnosis not present

## 2019-03-19 DIAGNOSIS — Z01812 Encounter for preprocedural laboratory examination: Secondary | ICD-10-CM | POA: Diagnosis not present

## 2019-03-25 DIAGNOSIS — N189 Chronic kidney disease, unspecified: Secondary | ICD-10-CM | POA: Diagnosis not present

## 2019-03-25 DIAGNOSIS — Z87891 Personal history of nicotine dependence: Secondary | ICD-10-CM | POA: Diagnosis not present

## 2019-03-25 DIAGNOSIS — Z471 Aftercare following joint replacement surgery: Secondary | ICD-10-CM | POA: Diagnosis not present

## 2019-03-25 DIAGNOSIS — I129 Hypertensive chronic kidney disease with stage 1 through stage 4 chronic kidney disease, or unspecified chronic kidney disease: Secondary | ICD-10-CM | POA: Diagnosis not present

## 2019-03-25 DIAGNOSIS — M1610 Unilateral primary osteoarthritis, unspecified hip: Secondary | ICD-10-CM | POA: Diagnosis not present

## 2019-03-25 DIAGNOSIS — Z87442 Personal history of urinary calculi: Secondary | ICD-10-CM | POA: Diagnosis not present

## 2019-03-25 DIAGNOSIS — E1122 Type 2 diabetes mellitus with diabetic chronic kidney disease: Secondary | ICD-10-CM | POA: Diagnosis not present

## 2019-03-25 DIAGNOSIS — K219 Gastro-esophageal reflux disease without esophagitis: Secondary | ICD-10-CM | POA: Diagnosis not present

## 2019-03-25 DIAGNOSIS — Z96641 Presence of right artificial hip joint: Secondary | ICD-10-CM | POA: Diagnosis not present

## 2019-03-25 DIAGNOSIS — M1611 Unilateral primary osteoarthritis, right hip: Secondary | ICD-10-CM | POA: Diagnosis not present

## 2019-03-26 ENCOUNTER — Other Ambulatory Visit: Payer: Self-pay

## 2019-03-26 DIAGNOSIS — M1610 Unilateral primary osteoarthritis, unspecified hip: Secondary | ICD-10-CM | POA: Diagnosis not present

## 2019-03-26 MED ORDER — GLUCOSE 40 % PO GEL
15.00 | ORAL | Status: DC
Start: ? — End: 2019-03-26

## 2019-03-26 MED ORDER — DEXTROSE 10 % IV SOLN
125.00 | INTRAVENOUS | Status: DC
Start: ? — End: 2019-03-26

## 2019-03-26 MED ORDER — BISACODYL 10 MG RE SUPP
10.00 | RECTAL | Status: DC
Start: ? — End: 2019-03-26

## 2019-03-26 MED ORDER — CLOPIDOGREL BISULFATE 75 MG PO TABS
75.00 | ORAL_TABLET | ORAL | Status: DC
Start: 2019-03-28 — End: 2019-03-26

## 2019-03-26 MED ORDER — ONDANSETRON HCL 4 MG/2ML IJ SOLN
4.00 | INTRAMUSCULAR | Status: DC
Start: ? — End: 2019-03-26

## 2019-03-26 MED ORDER — ALUMINUM-MAGNESIUM-SIMETHICONE 200-200-20 MG/5ML PO SUSP
30.00 | ORAL | Status: DC
Start: ? — End: 2019-03-26

## 2019-03-26 MED ORDER — CYCLOBENZAPRINE HCL 10 MG PO TABS
10.00 | ORAL_TABLET | ORAL | Status: DC
Start: ? — End: 2019-03-26

## 2019-03-26 MED ORDER — PROPRANOLOL HCL 40 MG PO TABS
40.00 | ORAL_TABLET | ORAL | Status: DC
Start: 2019-03-28 — End: 2019-03-26

## 2019-03-26 MED ORDER — SENNOSIDES-DOCUSATE SODIUM 8.6-50 MG PO TABS
2.00 | ORAL_TABLET | ORAL | Status: DC
Start: 2019-03-27 — End: 2019-03-26

## 2019-03-26 MED ORDER — CELECOXIB 200 MG PO CAPS
200.00 | ORAL_CAPSULE | ORAL | Status: DC
Start: 2019-03-28 — End: 2019-03-26

## 2019-03-26 MED ORDER — ATORVASTATIN CALCIUM 40 MG PO TABS
80.00 | ORAL_TABLET | ORAL | Status: DC
Start: 2019-03-27 — End: 2019-03-26

## 2019-03-26 MED ORDER — INSULIN LISPRO 100 UNIT/ML ~~LOC~~ SOLN
2.00 | SUBCUTANEOUS | Status: DC
Start: 2019-03-27 — End: 2019-03-26

## 2019-03-26 MED ORDER — PANTOPRAZOLE SODIUM 40 MG PO TBEC
40.00 | DELAYED_RELEASE_TABLET | ORAL | Status: DC
Start: 2019-03-27 — End: 2019-03-26

## 2019-03-26 MED ORDER — POLYSACCHARIDE IRON COMPLEX 150 MG PO CAPS
150.00 | ORAL_CAPSULE | ORAL | Status: DC
Start: 2019-03-28 — End: 2019-03-26

## 2019-03-26 MED ORDER — TRAMADOL HCL 50 MG PO TABS
50.00 | ORAL_TABLET | ORAL | Status: DC
Start: ? — End: 2019-03-26

## 2019-03-26 MED ORDER — PREGABALIN 75 MG PO CAPS
75.00 | ORAL_CAPSULE | ORAL | Status: DC
Start: 2019-03-27 — End: 2019-03-26

## 2019-03-26 MED ORDER — GLUCAGON HCL RDNA (DIAGNOSTIC) 1 MG IJ SOLR
1.00 | INTRAMUSCULAR | Status: DC
Start: ? — End: 2019-03-26

## 2019-03-26 MED ORDER — DIPHENHYDRAMINE HCL 12.5 MG/5ML PO ELIX
12.50 | ORAL_SOLUTION | ORAL | Status: DC
Start: ? — End: 2019-03-26

## 2019-03-26 MED ORDER — KETOROLAC TROMETHAMINE 30 MG/ML IJ SOLN
30.00 | INTRAMUSCULAR | Status: DC
Start: ? — End: 2019-03-26

## 2019-03-26 MED ORDER — LOSARTAN POTASSIUM 50 MG PO TABS
100.00 | ORAL_TABLET | ORAL | Status: DC
Start: 2019-03-28 — End: 2019-03-26

## 2019-03-26 MED ORDER — ASPIRIN EC 81 MG PO TBEC
81.00 | DELAYED_RELEASE_TABLET | ORAL | Status: DC
Start: 2019-03-27 — End: 2019-03-26

## 2019-03-26 MED ORDER — ACETAMINOPHEN 500 MG PO TABS
1000.00 | ORAL_TABLET | ORAL | Status: DC
Start: 2019-03-27 — End: 2019-03-26

## 2019-03-26 MED ORDER — DIPHENHYDRAMINE HCL 50 MG/ML IJ SOLN
12.50 | INTRAMUSCULAR | Status: DC
Start: ? — End: 2019-03-26

## 2019-03-26 MED ORDER — ENEMA 7-19 GM/118ML RE ENEM
1.00 | ENEMA | RECTAL | Status: DC
Start: ? — End: 2019-03-26

## 2019-03-26 MED ORDER — GENERIC EXTERNAL MEDICATION
1.00 | Status: DC
Start: 2019-03-28 — End: 2019-03-26

## 2019-03-26 MED ORDER — CETIRIZINE HCL 10 MG PO TABS
10.00 | ORAL_TABLET | ORAL | Status: DC
Start: 2019-03-28 — End: 2019-03-26

## 2019-03-26 MED ORDER — CHOLECALCIFEROL 25 MCG (1000 UT) PO TABS
1000.00 | ORAL_TABLET | ORAL | Status: DC
Start: 2019-03-28 — End: 2019-03-26

## 2019-03-26 MED ORDER — OXYCODONE HCL 5 MG PO TABS
10.00 | ORAL_TABLET | ORAL | Status: DC
Start: ? — End: 2019-03-26

## 2019-03-26 NOTE — Patient Outreach (Signed)
  [facility]) Care Management Chronic Special Needs Program    03/26/2019  Name: Alex Mccarty, DOB: [birth year]  MRN: [id number]   Mr. Alex Mccarty is enrolled in a chronic special needs plan for Diabetes. Client admitted to [facility] for rt total hip arthoplasty on 03/25/19 Individualized care plan sent to [facility] for admission  Plan to follow post discharge  Peter Garter RN, Jackquline Denmark, [place] Management [phone]

## 2019-03-28 DIAGNOSIS — Z471 Aftercare following joint replacement surgery: Secondary | ICD-10-CM | POA: Diagnosis not present

## 2019-03-28 DIAGNOSIS — Z9181 History of falling: Secondary | ICD-10-CM | POA: Diagnosis not present

## 2019-03-28 DIAGNOSIS — Z8673 Personal history of transient ischemic attack (TIA), and cerebral infarction without residual deficits: Secondary | ICD-10-CM | POA: Diagnosis not present

## 2019-03-28 DIAGNOSIS — Z96641 Presence of right artificial hip joint: Secondary | ICD-10-CM | POA: Diagnosis not present

## 2019-03-28 DIAGNOSIS — Z7902 Long term (current) use of antithrombotics/antiplatelets: Secondary | ICD-10-CM | POA: Diagnosis not present

## 2019-03-28 DIAGNOSIS — Z794 Long term (current) use of insulin: Secondary | ICD-10-CM | POA: Diagnosis not present

## 2019-03-28 DIAGNOSIS — E1122 Type 2 diabetes mellitus with diabetic chronic kidney disease: Secondary | ICD-10-CM | POA: Diagnosis not present

## 2019-03-28 DIAGNOSIS — I129 Hypertensive chronic kidney disease with stage 1 through stage 4 chronic kidney disease, or unspecified chronic kidney disease: Secondary | ICD-10-CM | POA: Diagnosis not present

## 2019-03-28 DIAGNOSIS — K219 Gastro-esophageal reflux disease without esophagitis: Secondary | ICD-10-CM | POA: Diagnosis not present

## 2019-03-28 DIAGNOSIS — Z87891 Personal history of nicotine dependence: Secondary | ICD-10-CM | POA: Diagnosis not present

## 2019-03-28 DIAGNOSIS — G4730 Sleep apnea, unspecified: Secondary | ICD-10-CM | POA: Diagnosis not present

## 2019-03-28 DIAGNOSIS — N189 Chronic kidney disease, unspecified: Secondary | ICD-10-CM | POA: Diagnosis not present

## 2019-03-29 ENCOUNTER — Other Ambulatory Visit: Payer: Self-pay

## 2019-03-29 NOTE — Patient Outreach (Signed)
  [facility]) Care Management Chronic Special Needs Program    03/29/2019  Name: Alex Mccarty, DOB: [birth year]  MRN: [id number]   [name] is enrolled in a chronic special needs plan for Diabetes. Client was admitted to [facility] for rt total hip arthoplasty on 03/25/19 and discharged on 03/27/19. Transition of care call  completed by [facility] utilization management team  On 03/28/19 and RNCM will follow up per their recommendations Plan to call for follow up in one month.  Peter Garter RN, Jackquline Denmark, CDE Chronic Care Management Coordinator [facility] [phone]

## 2019-04-03 ENCOUNTER — Other Ambulatory Visit: Payer: Self-pay

## 2019-04-03 NOTE — Patient Outreach (Signed)
  [facility]) Care Management Chronic Special Needs Program   04/03/2019  Name: Alex Mccarty, DOB: [birth year]  MRN: [id number]  The client was discussed in today's interdisciplinary care team meeting.  The following issues were discussed:  Client's needs, Care Plan and Care transitions  Participants present:  Mahlon Gammon, MSN, RN, CCM, CNS    Thea Silversmith, MSN, RN, CCM   Nashla Althoff RN,BSN,CCM, CDE  Kelli Churn, RN, CCM, CDE  Marco Collie, MD  Bary Castilla, RN, BSN, MS, CCM Coralie Carpen, MD  Recommendations:  [facility] utilization management team completed transition of care call, follow up as scheduled  Plan:  Follow up as scheduled   Follow-up:  One month  [place], Jackquline Denmark, [place] Management Coordinator [facility] Management [phone]

## 2019-04-09 DIAGNOSIS — R29898 Other symptoms and signs involving the musculoskeletal system: Secondary | ICD-10-CM | POA: Diagnosis not present

## 2019-04-09 DIAGNOSIS — M1610 Unilateral primary osteoarthritis, unspecified hip: Secondary | ICD-10-CM | POA: Diagnosis not present

## 2019-04-09 DIAGNOSIS — R2689 Other abnormalities of gait and mobility: Secondary | ICD-10-CM | POA: Diagnosis not present

## 2019-04-09 DIAGNOSIS — M25551 Pain in right hip: Secondary | ICD-10-CM | POA: Diagnosis not present

## 2019-04-11 DIAGNOSIS — Z471 Aftercare following joint replacement surgery: Secondary | ICD-10-CM | POA: Diagnosis not present

## 2019-04-11 DIAGNOSIS — M1610 Unilateral primary osteoarthritis, unspecified hip: Secondary | ICD-10-CM | POA: Diagnosis not present

## 2019-04-11 DIAGNOSIS — R2689 Other abnormalities of gait and mobility: Secondary | ICD-10-CM | POA: Diagnosis not present

## 2019-04-11 DIAGNOSIS — M25551 Pain in right hip: Secondary | ICD-10-CM | POA: Diagnosis not present

## 2019-04-16 DIAGNOSIS — M1610 Unilateral primary osteoarthritis, unspecified hip: Secondary | ICD-10-CM | POA: Diagnosis not present

## 2019-04-16 DIAGNOSIS — R2689 Other abnormalities of gait and mobility: Secondary | ICD-10-CM | POA: Diagnosis not present

## 2019-04-16 DIAGNOSIS — Z471 Aftercare following joint replacement surgery: Secondary | ICD-10-CM | POA: Diagnosis not present

## 2019-04-16 DIAGNOSIS — M25551 Pain in right hip: Secondary | ICD-10-CM | POA: Diagnosis not present

## 2019-04-18 DIAGNOSIS — Z471 Aftercare following joint replacement surgery: Secondary | ICD-10-CM | POA: Diagnosis not present

## 2019-04-18 DIAGNOSIS — R2689 Other abnormalities of gait and mobility: Secondary | ICD-10-CM | POA: Diagnosis not present

## 2019-04-18 DIAGNOSIS — M1610 Unilateral primary osteoarthritis, unspecified hip: Secondary | ICD-10-CM | POA: Diagnosis not present

## 2019-04-18 DIAGNOSIS — M25551 Pain in right hip: Secondary | ICD-10-CM | POA: Diagnosis not present

## 2019-04-23 DIAGNOSIS — Z471 Aftercare following joint replacement surgery: Secondary | ICD-10-CM | POA: Diagnosis not present

## 2019-04-23 DIAGNOSIS — M25551 Pain in right hip: Secondary | ICD-10-CM | POA: Diagnosis not present

## 2019-04-23 DIAGNOSIS — M4722 Other spondylosis with radiculopathy, cervical region: Secondary | ICD-10-CM | POA: Diagnosis not present

## 2019-04-23 DIAGNOSIS — M1610 Unilateral primary osteoarthritis, unspecified hip: Secondary | ICD-10-CM | POA: Diagnosis not present

## 2019-04-25 DIAGNOSIS — Z471 Aftercare following joint replacement surgery: Secondary | ICD-10-CM | POA: Diagnosis not present

## 2019-04-25 DIAGNOSIS — M25551 Pain in right hip: Secondary | ICD-10-CM | POA: Diagnosis not present

## 2019-04-25 DIAGNOSIS — M1610 Unilateral primary osteoarthritis, unspecified hip: Secondary | ICD-10-CM | POA: Diagnosis not present

## 2019-04-25 DIAGNOSIS — M4722 Other spondylosis with radiculopathy, cervical region: Secondary | ICD-10-CM | POA: Diagnosis not present

## 2019-04-26 DIAGNOSIS — Z471 Aftercare following joint replacement surgery: Secondary | ICD-10-CM | POA: Diagnosis not present

## 2019-04-26 DIAGNOSIS — Z96641 Presence of right artificial hip joint: Secondary | ICD-10-CM | POA: Diagnosis not present

## 2019-04-29 DIAGNOSIS — R2689 Other abnormalities of gait and mobility: Secondary | ICD-10-CM | POA: Diagnosis not present

## 2019-04-29 DIAGNOSIS — M1610 Unilateral primary osteoarthritis, unspecified hip: Secondary | ICD-10-CM | POA: Diagnosis not present

## 2019-04-29 DIAGNOSIS — M25551 Pain in right hip: Secondary | ICD-10-CM | POA: Diagnosis not present

## 2019-04-29 DIAGNOSIS — Z471 Aftercare following joint replacement surgery: Secondary | ICD-10-CM | POA: Diagnosis not present

## 2019-04-30 ENCOUNTER — Other Ambulatory Visit: Payer: Self-pay

## 2019-04-30 NOTE — Patient Outreach (Signed)
  [facility]) Care Management Chronic Special Needs Program  04/30/2019  Name: KLYE BESECKER DOB: October 06, 1945  MRN: [id number]  Mr. Natale Thoma is enrolled in a chronic special needs plan for Diabetes and Heart Failure. Reviewed and updated care plan. Client requests RNCM speak with wife Jenny Reichmann  Subjective: States that client is doing well after his hip surgery. States he saw his surgeon on Friday for a follow up. States he is going to PT twice a week.  States he went to the [place] today to have a skin cancer removed from his head.  States that his blood sugars have been slightly higher since his surgery ranging 145-150 but thinks it is because he is not as active right now  Goals Addressed            This Visit's Progress   .  Diabetes Patient stated goal to keep morning blood  sugar between 100-110  (pt-stated)   Not on track   . Client understands the importance of follow-up with providers by attending scheduled visits   On track   . Client will not report change from baseline and no repeated symptoms of TIA with in the next 9 months    On track   . Client will use Assistive Devices as needed and verbalize understanding of device use   On track   . Client will verbalize knowledge of self management of Hypertension as evidences by BP reading of 140/90 or less; or as defined by provider   On track   . COMPLETED: General - Client will not be readmitted within 30 days (C-SNP)       No readmission in 30 days from discharge date 03/27/2019    . HEMOGLOBIN A1C < 7.0       Continue-Diabetes self management actions:  Glucose monitoring per provider recommendations  Eat Healthy  Check feet daily  Visit provider every 3-6 months as directed  Hbg A1C level every 3-6 months.  Eye Exam yearly    . Maintain timely refills of diabetic medication as prescribed within the year .   On track   . COMPLETED: Obtain annual  Lipid Profile, LDL-C       Completed 01/15/2019    .  Obtain Annual Eye (retinal)  Exam    On track   . Obtain Annual Foot Exam   On track   . Obtain annual screen for micro albuminuria (urine) , nephropathy (kidney problems)   On track   . Obtain Hemoglobin A1C at least 2 times per year   On track   . Visit Primary Care Provider or Endocrinologist at least 2 times per year    On track    No readmission post 30 days from rt hip replacement Reinforced to follow low CHO diet and to progress activity as tolerated.  Plan:  Send successful outreach letter with a copy of their individualized care plan and Send individual care plan to provider  Chronic care management coordinator will outreach in:  4 Months for scheduled follow up     [place], Jackquline Denmark, [place] Management [phone]

## 2019-05-02 DIAGNOSIS — M25551 Pain in right hip: Secondary | ICD-10-CM | POA: Diagnosis not present

## 2019-05-02 DIAGNOSIS — M1610 Unilateral primary osteoarthritis, unspecified hip: Secondary | ICD-10-CM | POA: Diagnosis not present

## 2019-05-02 DIAGNOSIS — Z471 Aftercare following joint replacement surgery: Secondary | ICD-10-CM | POA: Diagnosis not present

## 2019-05-02 DIAGNOSIS — R29898 Other symptoms and signs involving the musculoskeletal system: Secondary | ICD-10-CM | POA: Diagnosis not present

## 2019-05-07 DIAGNOSIS — Z471 Aftercare following joint replacement surgery: Secondary | ICD-10-CM | POA: Diagnosis not present

## 2019-05-07 DIAGNOSIS — M25551 Pain in right hip: Secondary | ICD-10-CM | POA: Diagnosis not present

## 2019-05-07 DIAGNOSIS — R2689 Other abnormalities of gait and mobility: Secondary | ICD-10-CM | POA: Diagnosis not present

## 2019-05-07 DIAGNOSIS — M1610 Unilateral primary osteoarthritis, unspecified hip: Secondary | ICD-10-CM | POA: Diagnosis not present

## 2019-05-14 DIAGNOSIS — M25551 Pain in right hip: Secondary | ICD-10-CM | POA: Diagnosis not present

## 2019-05-14 DIAGNOSIS — Z471 Aftercare following joint replacement surgery: Secondary | ICD-10-CM | POA: Diagnosis not present

## 2019-05-14 DIAGNOSIS — M1610 Unilateral primary osteoarthritis, unspecified hip: Secondary | ICD-10-CM | POA: Diagnosis not present

## 2019-05-14 DIAGNOSIS — R2689 Other abnormalities of gait and mobility: Secondary | ICD-10-CM | POA: Diagnosis not present

## 2019-05-16 DIAGNOSIS — M1610 Unilateral primary osteoarthritis, unspecified hip: Secondary | ICD-10-CM | POA: Diagnosis not present

## 2019-05-16 DIAGNOSIS — M25522 Pain in left elbow: Secondary | ICD-10-CM | POA: Diagnosis not present

## 2019-05-16 DIAGNOSIS — M25551 Pain in right hip: Secondary | ICD-10-CM | POA: Diagnosis not present

## 2019-05-16 DIAGNOSIS — M7712 Lateral epicondylitis, left elbow: Secondary | ICD-10-CM | POA: Diagnosis not present

## 2019-05-16 DIAGNOSIS — Z471 Aftercare following joint replacement surgery: Secondary | ICD-10-CM | POA: Diagnosis not present

## 2019-05-16 DIAGNOSIS — R2689 Other abnormalities of gait and mobility: Secondary | ICD-10-CM | POA: Diagnosis not present

## 2019-05-21 DIAGNOSIS — M1610 Unilateral primary osteoarthritis, unspecified hip: Secondary | ICD-10-CM | POA: Diagnosis not present

## 2019-05-21 DIAGNOSIS — Z471 Aftercare following joint replacement surgery: Secondary | ICD-10-CM | POA: Diagnosis not present

## 2019-05-21 DIAGNOSIS — M25551 Pain in right hip: Secondary | ICD-10-CM | POA: Diagnosis not present

## 2019-05-21 DIAGNOSIS — R29898 Other symptoms and signs involving the musculoskeletal system: Secondary | ICD-10-CM | POA: Diagnosis not present

## 2019-06-04 DIAGNOSIS — Z794 Long term (current) use of insulin: Secondary | ICD-10-CM | POA: Diagnosis not present

## 2019-06-04 DIAGNOSIS — I129 Hypertensive chronic kidney disease with stage 1 through stage 4 chronic kidney disease, or unspecified chronic kidney disease: Secondary | ICD-10-CM | POA: Diagnosis not present

## 2019-06-04 DIAGNOSIS — N183 Chronic kidney disease, stage 3 (moderate): Secondary | ICD-10-CM | POA: Diagnosis not present

## 2019-06-04 DIAGNOSIS — E1122 Type 2 diabetes mellitus with diabetic chronic kidney disease: Secondary | ICD-10-CM | POA: Diagnosis not present

## 2019-06-04 DIAGNOSIS — H8112 Benign paroxysmal vertigo, left ear: Secondary | ICD-10-CM | POA: Diagnosis not present

## 2019-06-24 DIAGNOSIS — M4722 Other spondylosis with radiculopathy, cervical region: Secondary | ICD-10-CM | POA: Diagnosis not present

## 2019-06-26 DIAGNOSIS — H8112 Benign paroxysmal vertigo, left ear: Secondary | ICD-10-CM | POA: Diagnosis not present

## 2019-06-27 DIAGNOSIS — M25522 Pain in left elbow: Secondary | ICD-10-CM | POA: Diagnosis not present

## 2019-06-28 DIAGNOSIS — M5127 Other intervertebral disc displacement, lumbosacral region: Secondary | ICD-10-CM | POA: Diagnosis not present

## 2019-06-28 DIAGNOSIS — M48061 Spinal stenosis, lumbar region without neurogenic claudication: Secondary | ICD-10-CM | POA: Diagnosis not present

## 2019-07-10 DIAGNOSIS — M47816 Spondylosis without myelopathy or radiculopathy, lumbar region: Secondary | ICD-10-CM | POA: Diagnosis not present

## 2019-07-10 DIAGNOSIS — I129 Hypertensive chronic kidney disease with stage 1 through stage 4 chronic kidney disease, or unspecified chronic kidney disease: Secondary | ICD-10-CM | POA: Diagnosis not present

## 2019-07-10 DIAGNOSIS — M5136 Other intervertebral disc degeneration, lumbar region: Secondary | ICD-10-CM | POA: Diagnosis not present

## 2019-07-10 DIAGNOSIS — M48061 Spinal stenosis, lumbar region without neurogenic claudication: Secondary | ICD-10-CM | POA: Diagnosis not present

## 2019-07-20 DIAGNOSIS — Z01812 Encounter for preprocedural laboratory examination: Secondary | ICD-10-CM | POA: Diagnosis not present

## 2019-07-20 DIAGNOSIS — U071 COVID-19: Secondary | ICD-10-CM | POA: Diagnosis not present

## 2019-07-20 DIAGNOSIS — Z20828 Contact with and (suspected) exposure to other viral communicable diseases: Secondary | ICD-10-CM | POA: Diagnosis not present

## 2019-08-05 DIAGNOSIS — S5330XD Traumatic rupture of unspecified ulnar collateral ligament, subsequent encounter: Secondary | ICD-10-CM | POA: Diagnosis not present

## 2019-08-05 DIAGNOSIS — Z20828 Contact with and (suspected) exposure to other viral communicable diseases: Secondary | ICD-10-CM | POA: Diagnosis not present

## 2019-08-05 DIAGNOSIS — Z01812 Encounter for preprocedural laboratory examination: Secondary | ICD-10-CM | POA: Diagnosis not present

## 2019-08-09 DIAGNOSIS — M7712 Lateral epicondylitis, left elbow: Secondary | ICD-10-CM | POA: Diagnosis not present

## 2019-08-09 DIAGNOSIS — S5330XA Traumatic rupture of unspecified ulnar collateral ligament, initial encounter: Secondary | ICD-10-CM | POA: Diagnosis not present

## 2019-08-09 DIAGNOSIS — G8918 Other acute postprocedural pain: Secondary | ICD-10-CM | POA: Diagnosis not present

## 2019-08-09 DIAGNOSIS — S5332XA Traumatic rupture of left ulnar collateral ligament, initial encounter: Secondary | ICD-10-CM | POA: Diagnosis not present

## 2019-08-14 DIAGNOSIS — M25522 Pain in left elbow: Secondary | ICD-10-CM | POA: Diagnosis not present

## 2019-08-19 DIAGNOSIS — M25522 Pain in left elbow: Secondary | ICD-10-CM | POA: Diagnosis not present

## 2019-08-23 DIAGNOSIS — R29898 Other symptoms and signs involving the musculoskeletal system: Secondary | ICD-10-CM | POA: Diagnosis not present

## 2019-08-23 DIAGNOSIS — M25622 Stiffness of left elbow, not elsewhere classified: Secondary | ICD-10-CM | POA: Diagnosis not present

## 2019-08-30 DIAGNOSIS — M25622 Stiffness of left elbow, not elsewhere classified: Secondary | ICD-10-CM | POA: Diagnosis not present

## 2019-08-30 DIAGNOSIS — R29898 Other symptoms and signs involving the musculoskeletal system: Secondary | ICD-10-CM | POA: Diagnosis not present

## 2019-09-03 ENCOUNTER — Other Ambulatory Visit: Payer: Self-pay

## 2019-09-03 DIAGNOSIS — Z96641 Presence of right artificial hip joint: Secondary | ICD-10-CM | POA: Diagnosis not present

## 2019-09-03 DIAGNOSIS — Z471 Aftercare following joint replacement surgery: Secondary | ICD-10-CM | POA: Diagnosis not present

## 2019-09-03 NOTE — Patient Outreach (Signed)
[facility]) Care Management Chronic Special Needs Program  09/03/2019  Name: TADAN SHILL DOB: 05/31/1946  MRN: [id number]  Mr. Alex Mccarty is enrolled in a chronic special needs plan for Diabetes. Reviewed and updated care plan.  Subjective: Client states he had surgery on his lt elbow about a month ago to repair a torn ligament.  State he is going to therapy and wearing a brace on his elbow.  States he is checking his blood sugars less since having his surgery but they usually range from 100-110.  States his Hemoglobin A1C was good the last time it was checked.  States he is still going to the [place] for a lot of his care.  States they check his kidneys and feet.  Denies any changes with his TIAs and states his neurologist at the [place] thinks he is having mini seizures.  States he is to go have his eye exam today.  Denies any falls.  States he is trying to eat healthy and walk when the weather permits.  States his B/Ps are good when he checks them  Goals Addressed            This Visit's Progress   . COMPLETED:  Diabetes Patient stated goal to keep morning blood  sugar between 100-110  (pt-stated)       Reports blood sugars range around 110    . COMPLETED: Client understands the importance of follow-up with providers by attending scheduled visits   On track    Reports keeping scheduled appointments    . COMPLETED: Client will not report change from baseline and no repeated symptoms of TIA with in the next 9 months    On track    Reports TIA symptoms unchanged and is followed by neurology regularly    . COMPLETED: Client will use Assistive Devices as needed and verbalize understanding of device use   On track    No issues reported with glucometer    . Client will verbalize knowledge of self management of Hypertension as evidences by BP reading of 140/90 or less; or as defined by provider   On track    Reports B/P less than 140/90 when checked    . COMPLETED:  Maintain timely refills of diabetic medication as prescribed within the year .   On track    Maintaining refills of insulin    . COMPLETED: Obtain Annual Eye (retinal)  Exam        Completed 09/03/19    . COMPLETED: Obtain Annual Foot Exam       Reported completed by [place]    . COMPLETED: Obtain annual screen for micro albuminuria (urine) , nephropathy (kidney problems)       Reports completed by [facility] nephrologist     . COMPLETED: Obtain Hemoglobin A1C at least 2 times per year       Completed 03/07/19,06/04/19    . COMPLETED: Visit Primary Care Provider or Endocrinologist at least 2 times per year        Completed 11/06/18, 03/07/19,06/04/19     Client is  meeting diabetes self management goal of hemoglobin A1C of <7% with last reading of 6.5% Reinforced to follow a low CHO low sodium diet Encouraged to get regular exercise along with his therapy exercises Reviewed number for 24-hour nurse Line Reviewed COVID 19 precautions  Plan:  Send successful outreach letter with a copy of their individualized care plan and Send individual care plan to provider  Chronic care management  coordinator will outreach in:  6-9 Months     [place], [facility], [place] Management Coordinator [place] Management [phone]

## 2019-09-05 DIAGNOSIS — M25622 Stiffness of left elbow, not elsewhere classified: Secondary | ICD-10-CM | POA: Diagnosis not present

## 2019-09-05 DIAGNOSIS — R29898 Other symptoms and signs involving the musculoskeletal system: Secondary | ICD-10-CM | POA: Diagnosis not present

## 2019-09-11 DIAGNOSIS — R29898 Other symptoms and signs involving the musculoskeletal system: Secondary | ICD-10-CM | POA: Diagnosis not present

## 2019-09-11 DIAGNOSIS — M25622 Stiffness of left elbow, not elsewhere classified: Secondary | ICD-10-CM | POA: Diagnosis not present

## 2019-09-17 DIAGNOSIS — M25622 Stiffness of left elbow, not elsewhere classified: Secondary | ICD-10-CM | POA: Diagnosis not present

## 2019-09-17 DIAGNOSIS — R29898 Other symptoms and signs involving the musculoskeletal system: Secondary | ICD-10-CM | POA: Diagnosis not present

## 2019-09-24 DIAGNOSIS — R29898 Other symptoms and signs involving the musculoskeletal system: Secondary | ICD-10-CM | POA: Diagnosis not present

## 2019-09-24 DIAGNOSIS — M25622 Stiffness of left elbow, not elsewhere classified: Secondary | ICD-10-CM | POA: Diagnosis not present

## 2019-09-26 DIAGNOSIS — R29898 Other symptoms and signs involving the musculoskeletal system: Secondary | ICD-10-CM | POA: Diagnosis not present

## 2019-09-26 DIAGNOSIS — M25622 Stiffness of left elbow, not elsewhere classified: Secondary | ICD-10-CM | POA: Diagnosis not present

## 2019-10-01 DIAGNOSIS — R29898 Other symptoms and signs involving the musculoskeletal system: Secondary | ICD-10-CM | POA: Diagnosis not present

## 2019-10-01 DIAGNOSIS — M25622 Stiffness of left elbow, not elsewhere classified: Secondary | ICD-10-CM | POA: Diagnosis not present

## 2019-10-03 DIAGNOSIS — M25622 Stiffness of left elbow, not elsewhere classified: Secondary | ICD-10-CM | POA: Diagnosis not present

## 2019-10-03 DIAGNOSIS — R29898 Other symptoms and signs involving the musculoskeletal system: Secondary | ICD-10-CM | POA: Diagnosis not present

## 2019-10-07 DIAGNOSIS — E113293 Type 2 diabetes mellitus with mild nonproliferative diabetic retinopathy without macular edema, bilateral: Secondary | ICD-10-CM | POA: Diagnosis not present

## 2019-10-07 DIAGNOSIS — H0012 Chalazion right lower eyelid: Secondary | ICD-10-CM | POA: Diagnosis not present

## 2019-10-08 DIAGNOSIS — R29898 Other symptoms and signs involving the musculoskeletal system: Secondary | ICD-10-CM | POA: Diagnosis not present

## 2019-10-08 DIAGNOSIS — M25622 Stiffness of left elbow, not elsewhere classified: Secondary | ICD-10-CM | POA: Diagnosis not present

## 2019-10-10 DIAGNOSIS — R29898 Other symptoms and signs involving the musculoskeletal system: Secondary | ICD-10-CM | POA: Diagnosis not present

## 2019-10-10 DIAGNOSIS — M25622 Stiffness of left elbow, not elsewhere classified: Secondary | ICD-10-CM | POA: Diagnosis not present

## 2019-10-15 DIAGNOSIS — M25512 Pain in left shoulder: Secondary | ICD-10-CM | POA: Diagnosis not present

## 2019-10-18 DIAGNOSIS — R29898 Other symptoms and signs involving the musculoskeletal system: Secondary | ICD-10-CM | POA: Diagnosis not present

## 2019-10-18 DIAGNOSIS — M25622 Stiffness of left elbow, not elsewhere classified: Secondary | ICD-10-CM | POA: Diagnosis not present

## 2019-10-22 DIAGNOSIS — R29898 Other symptoms and signs involving the musculoskeletal system: Secondary | ICD-10-CM | POA: Diagnosis not present

## 2019-10-22 DIAGNOSIS — M25622 Stiffness of left elbow, not elsewhere classified: Secondary | ICD-10-CM | POA: Diagnosis not present

## 2019-10-29 DIAGNOSIS — R29898 Other symptoms and signs involving the musculoskeletal system: Secondary | ICD-10-CM | POA: Diagnosis not present

## 2019-10-29 DIAGNOSIS — M25622 Stiffness of left elbow, not elsewhere classified: Secondary | ICD-10-CM | POA: Diagnosis not present

## 2019-10-31 ENCOUNTER — Encounter: Payer: Self-pay | Admitting: Gastroenterology

## 2019-11-02 ENCOUNTER — Ambulatory Visit: Payer: HMO | Attending: Internal Medicine

## 2019-11-02 DIAGNOSIS — Z23 Encounter for immunization: Secondary | ICD-10-CM | POA: Insufficient documentation

## 2019-11-02 NOTE — Progress Notes (Signed)
   Covid-19 Vaccination Clinic  Name:  Alex Mccarty    MRN: [id number] DOB: Jan 04, 1946  11/02/2019  Mr. [name] was observed post Covid-19 immunization for 15 minutes without incidence. He was provided with Vaccine Information Sheet and instruction to access the V-Safe system.   [name] was instructed to call 911 with any severe reactions post vaccine: [name] Difficulty breathing  . Swelling of your face and throat  . A fast heartbeat  . A bad rash all over your body  . Dizziness and weakness    Immunizations Administered    Name Date Dose VIS Date Route   Pfizer COVID-19 Vaccine 11/02/2019  1:52 PM 0.3 mL 08/30/2019 Intramuscular   Manufacturer: [place]   Lot: [id number]   [place]: [address]-8

## 2019-11-04 DIAGNOSIS — H0012 Chalazion right lower eyelid: Secondary | ICD-10-CM | POA: Diagnosis not present

## 2019-11-04 DIAGNOSIS — Z961 Presence of intraocular lens: Secondary | ICD-10-CM | POA: Diagnosis not present

## 2019-11-04 DIAGNOSIS — H02102 Unspecified ectropion of right lower eyelid: Secondary | ICD-10-CM | POA: Diagnosis not present

## 2019-11-05 DIAGNOSIS — R29898 Other symptoms and signs involving the musculoskeletal system: Secondary | ICD-10-CM | POA: Diagnosis not present

## 2019-11-05 DIAGNOSIS — M25622 Stiffness of left elbow, not elsewhere classified: Secondary | ICD-10-CM | POA: Diagnosis not present

## 2019-11-12 DIAGNOSIS — R29898 Other symptoms and signs involving the musculoskeletal system: Secondary | ICD-10-CM | POA: Diagnosis not present

## 2019-11-12 DIAGNOSIS — M25622 Stiffness of left elbow, not elsewhere classified: Secondary | ICD-10-CM | POA: Diagnosis not present

## 2019-11-18 ENCOUNTER — Encounter: Payer: Self-pay | Admitting: Gastroenterology

## 2019-11-18 ENCOUNTER — Other Ambulatory Visit: Payer: Self-pay

## 2019-11-18 ENCOUNTER — Ambulatory Visit (INDEPENDENT_AMBULATORY_CARE_PROVIDER_SITE_OTHER): Payer: No Typology Code available for payment source | Admitting: Gastroenterology

## 2019-11-18 VITALS — BP 146/74 | HR 78 | Temp 97.1°F | Ht 69.0 in | Wt 192.4 lb

## 2019-11-18 DIAGNOSIS — R935 Abnormal findings on diagnostic imaging of other abdominal regions, including retroperitoneum: Secondary | ICD-10-CM

## 2019-11-18 NOTE — Patient Instructions (Signed)
If you are age 72 or older, your body mass index should be between 23-30. Your Body mass index is 28.41 kg/m. If this is out of the aforementioned range listed, please consider follow up with your Primary Care Provider.  If you are age 58 or younger, your body mass index should be between 19-25. Your Body mass index is 28.41 kg/m. If this is out of the aformentioned range listed, please consider follow up with your Primary Care Provider.   You have been scheduled for an endoscopy. Please follow written instructions given to you at your visit today. If you use inhalers (even only as needed), please bring them with you on the day of your procedure. Your physician has requested that you go to www.startemmi.com and enter the access code given to you at your visit today. This web site gives a general overview about your procedure. However, you should still follow specific instructions given to you by our office regarding your preparation for the procedure.  You will be contacted by our office prior to your procedure for directions on holding your Plavix.  If you do not hear from our office 1 week prior to your scheduled procedure, please call [phone] to discuss.   Continue Omeprazole.   Thank you,  Dr. Jackquline Denmark

## 2019-11-18 NOTE — Progress Notes (Signed)
Chief Complaint:   Referring Provider:  Delilah Shan, MD      ASSESSMENT AND PLAN;   #1. Abn MRI showing ?  1.7 cm lesion in the first portion of the duodenum.  #2.  GERD  #3. 72mm sidebranch duct IPMN (stable)  Plan: - EGD off plavix x 5 days.  I have discussed risks and benefits in detail. - Can continue ASA throughout. - Continue omeprazole 40 mg p.o. once a day for now. - Extensive notes were reviewed.   HPI:    Alex Mccarty is a 74 y.o. male  [facility] ?  1.7 cm lesion in the first portion of the duodenum.  He has been recommended to get EGD performed.  The MRI also shows a 12 mm cystic lesion in the pancreatic body/tail junction suspected side branch duct IPMN.  No nausea, vomiting, heartburn (on omeprazole), regurgitation, odynophagia or dysphagia.  No significant diarrhea or constipation.  No melena or hematochezia. No unintentional weight loss. No abdominal pain.  He had bad experience previously with endoscopic procedures in 2017.  He apparently had EGD and colonoscopy.  Had severe neck problems thereafter which lasted for 3 months requiring physical therapy.  Other significant medical history includes history of right hemicolectomy with ileocolonic anastomosis for partial small bowel obstruction January 2019.  He does have incisional and periumbilical hernia.  Denies having any abdominal pain over the hernia.     Past Medical History:  Diagnosis Date  . Arthritis   . Bowel obstruction ([place])   . CKD (chronic kidney disease)    unkknown stage per patient  . Diabetes mellitus ([place])   . Hyperlipidemia   . Hypertension   . Kidney stones     Past Surgical History:  Procedure Laterality Date  . COLONOSCOPY  10/2015   Dr Kenton Kingfisher  . COLOSTOMY REVISION Right 09/29/2017   Procedure: COLON RESECTION RIGHT;  Surgeon: Jovita Kussmaul, MD;  Location: [facility];  Service: General;  Laterality: Right;  . ELBOW SURGERY     ulnar nerve  repair  . ESOPHAGOGASTRODUODENOSCOPY     same time   . FOOT SURGERY     crushed heel  . LAPAROTOMY N/A 09/29/2017   Procedure: EXPLORATORY LAPAROTOMY;  Surgeon: Jovita Kussmaul, MD;  Location: [facility];  Service: General;  Laterality: N/A;    Family History  Problem Relation Age of Onset  . Kidney Stones Mother   . Heart disease Mother   . Colon cancer Neg Hx   . Esophageal cancer Neg Hx     Social History   Tobacco Use  . Smoking status: Former Research scientist (life sciences)  . Smokeless tobacco: Never Used  . Tobacco comment: quit over 40 years ago.Around age [age]   Substance Use Topics  . Alcohol use: Yes    Alcohol/week: 0.0 standard drinks    Comment: occasional  . Drug use: No    Current Outpatient Medications  Medication Sig Dispense Refill  . ASPIRIN LOW DOSE 81 MG EC tablet Take 81 mg by mouth at bedtime.     [name] atorvastatin (LIPITOR) 80 MG tablet Take 80 mg by mouth at bedtime.     . cetirizine (ZYRTEC) 10 MG tablet Take 10 mg by mouth daily as needed.     . Cholecalciferol (VITAMIN D) 2000 units tablet Take 2,000 Units by mouth at bedtime.    . cyclobenzaprine (FLEXERIL) 10 MG tablet Take 10 mg by mouth 3 (three) times daily  as needed (muscle pain).    [name] gabapentin (NEURONTIN) 300 MG capsule Take 300 mg by mouth at bedtime.    . Glucosamine-Chondroitin (COSAMIN DS PO) Take 1 capsule by mouth at bedtime.    [name] LANTUS SOLOSTAR 100 UNIT/ML Solostar Pen Inject 40 Units into the skin every morning.     [name] losartan (COZAAR) 100 MG tablet Take 100 mg by mouth daily.     . meclizine (ANTIVERT) 25 MG tablet Take 1 tablet (25 mg total) by mouth 3 (three) times daily as needed for dizziness. (Patient taking differently: Take 25 mg by mouth 3 (three) times daily as needed for dizziness. dizziness) 30 tablet 0  . potassium citrate (UROCIT-K) 10 MEQ (1080 MG) SR tablet Take 15 mEq by mouth daily before breakfast.     . propranolol (INDERAL) 10 MG tablet Take 10 mg by mouth daily.    [name] spironolactone  (ALDACTONE) 25 MG tablet Take 12.5 mg by mouth daily.    [name] tiZANidine (ZANAFLEX) 4 MG tablet Take 4 mg by mouth every 6 (six) hours as needed for muscle spasms.    . traMADol (ULTRAM) 50 MG tablet Take 1-2 tablets (50-100 mg total) by mouth every 6 (six) hours as needed for moderate pain or severe pain. 20 tablet 0   No current facility-administered medications for this visit.    Allergies  Allergen Reactions  . Amlodipine Besylate Other (See Comments)    HIVES  . Codeine Nausea And Vomiting  . Penicillins Other (See Comments)    Childhood reaction Has patient had a PCN reaction causing immediate rash, facial/tongue/throat swelling, SOB or lightheadedness with hypotension: Unknown Has patient had a PCN reaction causing severe rash involving mucus membranes or skin necrosis: Unknown Has patient had a PCN reaction that required hospitalization: Unknown Has patient had a PCN reaction occurring within the last 10 years: Unknown If all of the above answers are "NO", then may proceed with Cephalosporin use.    . Sulfa Antibiotics Nausea And Vomiting    Review of Systems:  Constitutional: Denies fever, chills, diaphoresis, appetite change and fatigue.  HEENT: Denies photophobia, eye pain, redness, hearing loss, ear pain, congestion, sore throat, rhinorrhea, sneezing, mouth sores, neck pain, neck stiffness and tinnitus.   Respiratory: Denies SOB, DOE, cough, chest tightness,  and wheezing.   Cardiovascular: Denies chest pain, palpitations and leg swelling.  Genitourinary: Denies dysuria, urgency, frequency, hematuria, flank pain and difficulty urinating.  Musculoskeletal: Denies myalgias, back pain, joint swelling, arthralgias and gait problem.  Skin: No rash.  Neurological: Denies dizziness, seizures, syncope, weakness, light-headedness, numbness and headaches.  Hematological: Denies adenopathy. Easy bruising, personal or family bleeding history  Psychiatric/Behavioral: No anxiety or  depression     Physical Exam:    BP (!) 146/74   Pulse 78   Temp (!) 97.1 F (36.2 C)   Ht 5\' 9"  (1.753 m)   Wt 192 lb 6 oz (87.3 kg)   BMI 28.41 kg/m  Wt Readings from Last 3 Encounters:  11/18/19 192 lb 6 oz (87.3 kg)  09/29/17 188 lb (85.3 kg)  11/13/15 190 lb (86.2 kg)   Constitutional:  Well-developed, in no acute distress. Psychiatric: Normal mood and affect. Behavior is normal. HEENT: Pupils normal.  Conjunctivae are normal. No scleral icterus. Neck supple.  Cardiovascular: Normal rate, regular rhythm. No edema Pulmonary/chest: Effort normal and breath sounds normal. No wheezing, rales or rhonchi. Abdominal: Soft, nondistended. Nontender. Bowel sounds active throughout. There are no masses palpable. No hepatomegaly.  Umbilical hernia.  Incisional hernia. Rectal:  defered Neurological: Alert and oriented to person place and time. Skin: Skin is warm and dry. No rashes noted.  Data Reviewed: I have personally reviewed following labs and imaging studies  CBC: CBC Latest Ref Rng & Units 10/05/2017 10/02/2017 09/29/2017  WBC 4.0 - 10.5 K/uL 6.9 5.3 5.8  Hemoglobin 13.0 - 17.0 g/dL 13.9 13.1 13.4  Hematocrit 39.0 - 52.0 % 41.5 40.9 41.3  Platelets 150 - 400 K/uL 92(L) 98(L) 130(L)    CMP: CMP Latest Ref Rng & Units 10/05/2017 10/03/2017 10/02/2017  Glucose 65 - 99 mg/dL 127(H) 132(H) 158(H)  BUN 6 - 20 mg/dL 22(H) 23(H) 29(H)  Creatinine 0.61 - 1.24 mg/dL 2.03(H) 1.81(H) 2.05(H)  Sodium 135 - 145 mmol/L 138 136 136  Potassium 3.5 - 5.1 mmol/L 5.1 4.5 4.4  Chloride 101 - 111 mmol/L 110 108 109  CO2 22 - 32 mmol/L 24 23 23   Calcium 8.9 - 10.3 mg/dL 9.5 8.4(L) 8.3(L)  Total Protein 6.5 - 8.1 g/dL - - -  Total Bilirubin 0.3 - 1.2 mg/dL - - -  Alkaline Phos 38 - 126 U/L - - -  AST 15 - 41 U/L - - -  ALT 17 - 63 U/L - - -  Labs from January 2021-creatinine 2.2, glucose 150, platelet 160, normal LFTs   Carmell Austria, MD 11/18/2019, 2:07 PM  Cc: Delilah Shan, MD

## 2019-11-19 ENCOUNTER — Telehealth: Payer: Self-pay

## 2019-11-20 NOTE — Telephone Encounter (Signed)
error 

## 2019-11-21 DIAGNOSIS — Z7409 Other reduced mobility: Secondary | ICD-10-CM | POA: Diagnosis not present

## 2019-11-21 DIAGNOSIS — M25512 Pain in left shoulder: Secondary | ICD-10-CM | POA: Diagnosis not present

## 2019-11-21 DIAGNOSIS — M25612 Stiffness of left shoulder, not elsewhere classified: Secondary | ICD-10-CM | POA: Diagnosis not present

## 2019-11-21 DIAGNOSIS — Z789 Other specified health status: Secondary | ICD-10-CM | POA: Diagnosis not present

## 2019-11-25 ENCOUNTER — Ambulatory Visit: Payer: HMO | Attending: Internal Medicine

## 2019-11-25 DIAGNOSIS — Z23 Encounter for immunization: Secondary | ICD-10-CM | POA: Insufficient documentation

## 2019-11-25 NOTE — Progress Notes (Signed)
   Covid-19 Vaccination Clinic  Name:  Alex Mccarty    MRN: [id number] DOB: May 03, 1946  11/25/2019  Alex Mccarty was observed post Covid-19 immunization for 15 minutes without incident. He was provided with Vaccine Information Sheet and instruction to access the V-Safe system.   Alex Mccarty was instructed to call 911 with any severe reactions post vaccine: [name] Difficulty breathing  . Swelling of face and throat  . A fast heartbeat  . A bad rash all over body  . Dizziness and weakness   Immunizations Administered    Name Date Dose VIS Date Route   Pfizer COVID-19 Vaccine 11/25/2019  2:04 PM 0.3 mL 08/30/2019 Intramuscular   Manufacturer: [place]   Lot: [id number]   [place]: [id number]

## 2019-11-28 DIAGNOSIS — Z7409 Other reduced mobility: Secondary | ICD-10-CM | POA: Diagnosis not present

## 2019-11-28 DIAGNOSIS — Z789 Other specified health status: Secondary | ICD-10-CM | POA: Diagnosis not present

## 2019-11-28 DIAGNOSIS — M25612 Stiffness of left shoulder, not elsewhere classified: Secondary | ICD-10-CM | POA: Diagnosis not present

## 2019-11-28 DIAGNOSIS — M25512 Pain in left shoulder: Secondary | ICD-10-CM | POA: Diagnosis not present

## 2019-12-05 DIAGNOSIS — Z7409 Other reduced mobility: Secondary | ICD-10-CM | POA: Diagnosis not present

## 2019-12-05 DIAGNOSIS — Z789 Other specified health status: Secondary | ICD-10-CM | POA: Diagnosis not present

## 2019-12-05 DIAGNOSIS — M25512 Pain in left shoulder: Secondary | ICD-10-CM | POA: Diagnosis not present

## 2019-12-05 DIAGNOSIS — M25612 Stiffness of left shoulder, not elsewhere classified: Secondary | ICD-10-CM | POA: Diagnosis not present

## 2019-12-06 ENCOUNTER — Telehealth: Payer: Self-pay | Admitting: Gastroenterology

## 2019-12-06 NOTE — Telephone Encounter (Signed)
Called and spoke with patient's wife-[name]-verified DPR-[name]-she reports she [facility]-she is wanting to drop off these results for Dr. Lyndel Safe to review in case the EGD scheduled on 12/13/2019 is not needed or needs to be changed to another test/procedure;   Trisha-please call patient and advise of Plavix hold-  Dr. Adonis Brook review these records and advise (they will be dropped off at the [facility] office for review)

## 2019-12-06 NOTE — Telephone Encounter (Signed)
Per Dr. Patrice Paradise patient is to hold Plavix 5 days before the procedure. Patient has been informed and voiced understanding.

## 2019-12-06 NOTE — Telephone Encounter (Signed)
Patient's wife is calling in reference to getting an MRI result to Dr. Lyndel Safe for review from [place] to see if the patient in fact still needs to have a colonoscopy that is scheduled for 12/12/19.

## 2019-12-06 NOTE — Telephone Encounter (Signed)
Thanks for letting me know RG 

## 2019-12-06 NOTE — Telephone Encounter (Signed)
I have been unsuccessful with getting a response from the [place] regarding Plavix. I have called and left Dr. Lorre Nick nurse Nevin Bloodgood a message.

## 2019-12-10 ENCOUNTER — Telehealth: Payer: Self-pay | Admitting: Gastroenterology

## 2019-12-10 NOTE — Telephone Encounter (Signed)
This is on Dr. Steve Rattler desk for review.

## 2019-12-10 NOTE — Telephone Encounter (Signed)
I do not see MRI report under procedure tab or media tab. Can we please locate the MRI report.  RG

## 2019-12-10 NOTE — Telephone Encounter (Signed)
Larena Glassman,  Will you please locate this report and place on Dr. Steve Rattler desk for review as the patient's wife dropped off this information to the [facility] office;

## 2019-12-10 NOTE — Telephone Encounter (Signed)
Please review previous message and advise-as patient's wife reports she dropped off the [place] MRI results for review

## 2019-12-11 NOTE — Telephone Encounter (Signed)
Pt's wife called to cancel EGD scheduled 12/12/19.  She stated that Dr. Greer Pickerel had reviewed MRI and does not believe that EGD is necessary.

## 2019-12-12 ENCOUNTER — Encounter: Payer: No Typology Code available for payment source | Admitting: Gastroenterology

## 2019-12-12 NOTE — Telephone Encounter (Signed)
Please see previous message

## 2019-12-12 NOTE — Telephone Encounter (Signed)
No problems Should follow-up if he still has problems RG

## 2019-12-12 NOTE — Telephone Encounter (Signed)
Called and spoke with patient's wife-[name]-verified DPR- [name] informed of MD recommendations; [name] is agreeable with plan of care; [name] verbalized understanding of information/instructions;  [name] was advised to call the office at [phone] if questions/concerns arise;

## 2019-12-13 DIAGNOSIS — M19012 Primary osteoarthritis, left shoulder: Secondary | ICD-10-CM | POA: Diagnosis not present

## 2019-12-13 DIAGNOSIS — Z789 Other specified health status: Secondary | ICD-10-CM | POA: Diagnosis not present

## 2019-12-13 DIAGNOSIS — M25612 Stiffness of left shoulder, not elsewhere classified: Secondary | ICD-10-CM | POA: Diagnosis not present

## 2019-12-13 DIAGNOSIS — M25512 Pain in left shoulder: Secondary | ICD-10-CM | POA: Diagnosis not present

## 2019-12-13 DIAGNOSIS — Z7409 Other reduced mobility: Secondary | ICD-10-CM | POA: Diagnosis not present

## 2019-12-14 NOTE — Telephone Encounter (Signed)
MRI abdomen without contrast 11/29/2019 Compared with MRI 02/28/2019  -B/L renal cysts.  Stable.  No further follow-up is required. -Duodenal abnormality in the prior study appearance favoring prominence of pylorus. 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  No changes.  Radiology recommends follow-up MRCP in 12 months.   RG  Bre, FU MRCP in 12 months RG

## 2019-12-16 NOTE — Telephone Encounter (Signed)
Left message for patient to call back to the office;  

## 2019-12-16 NOTE — Telephone Encounter (Signed)
Pt's wife returned your call.  °

## 2019-12-17 NOTE — Telephone Encounter (Signed)
Called and spoke with patient's wife-[name]-verified DPR-[name] informed of result note and MD recommendations; [name] is agreeable with plan of care and verbalized understanding that a message has been sent to the [place] for a recall for the MRI with MRCP in 12 months per radiologist recommendations/MD approval; [name] verbalized understanding of information/instructions;  [name] was advised to call the office at [phone] if questions/concerns arise;

## 2019-12-19 DIAGNOSIS — M25512 Pain in left shoulder: Secondary | ICD-10-CM | POA: Diagnosis not present

## 2019-12-19 DIAGNOSIS — M25612 Stiffness of left shoulder, not elsewhere classified: Secondary | ICD-10-CM | POA: Diagnosis not present

## 2019-12-19 DIAGNOSIS — Z789 Other specified health status: Secondary | ICD-10-CM | POA: Diagnosis not present

## 2019-12-19 DIAGNOSIS — Z7409 Other reduced mobility: Secondary | ICD-10-CM | POA: Diagnosis not present

## 2019-12-26 DIAGNOSIS — M25612 Stiffness of left shoulder, not elsewhere classified: Secondary | ICD-10-CM | POA: Diagnosis not present

## 2019-12-26 DIAGNOSIS — M25512 Pain in left shoulder: Secondary | ICD-10-CM | POA: Diagnosis not present

## 2019-12-26 DIAGNOSIS — Z789 Other specified health status: Secondary | ICD-10-CM | POA: Diagnosis not present

## 2019-12-26 DIAGNOSIS — Z7409 Other reduced mobility: Secondary | ICD-10-CM | POA: Diagnosis not present

## 2020-01-02 DIAGNOSIS — M25612 Stiffness of left shoulder, not elsewhere classified: Secondary | ICD-10-CM | POA: Diagnosis not present

## 2020-01-02 DIAGNOSIS — M25512 Pain in left shoulder: Secondary | ICD-10-CM | POA: Diagnosis not present

## 2020-01-02 DIAGNOSIS — Z7409 Other reduced mobility: Secondary | ICD-10-CM | POA: Diagnosis not present

## 2020-01-02 DIAGNOSIS — Z789 Other specified health status: Secondary | ICD-10-CM | POA: Diagnosis not present

## 2020-01-09 DIAGNOSIS — M25612 Stiffness of left shoulder, not elsewhere classified: Secondary | ICD-10-CM | POA: Diagnosis not present

## 2020-01-09 DIAGNOSIS — Z7409 Other reduced mobility: Secondary | ICD-10-CM | POA: Diagnosis not present

## 2020-01-09 DIAGNOSIS — M25512 Pain in left shoulder: Secondary | ICD-10-CM | POA: Diagnosis not present

## 2020-01-09 DIAGNOSIS — Z789 Other specified health status: Secondary | ICD-10-CM | POA: Diagnosis not present

## 2020-01-16 DIAGNOSIS — M25612 Stiffness of left shoulder, not elsewhere classified: Secondary | ICD-10-CM | POA: Diagnosis not present

## 2020-01-16 DIAGNOSIS — Z7409 Other reduced mobility: Secondary | ICD-10-CM | POA: Diagnosis not present

## 2020-01-16 DIAGNOSIS — M25512 Pain in left shoulder: Secondary | ICD-10-CM | POA: Diagnosis not present

## 2020-01-16 DIAGNOSIS — Z789 Other specified health status: Secondary | ICD-10-CM | POA: Diagnosis not present

## 2020-01-27 DIAGNOSIS — G5602 Carpal tunnel syndrome, left upper limb: Secondary | ICD-10-CM | POA: Diagnosis not present

## 2020-01-27 DIAGNOSIS — G5622 Lesion of ulnar nerve, left upper limb: Secondary | ICD-10-CM | POA: Diagnosis not present

## 2020-03-03 ENCOUNTER — Other Ambulatory Visit: Payer: Self-pay

## 2020-03-03 NOTE — Patient Outreach (Signed)
  [place] [facility]) Care Management Chronic Special Needs Program  03/03/2020  Name: Alex Mccarty DOB: 05-05-1946  MRN: [id number]  Mr. Alex Mccarty is enrolled in a chronic special needs plan for Diabetes. Client called with no answer No answer and HIPAA compliant message left. 1st attempt Plan for 2nd outreach call in 1-2 weeks Chronic care management coordinator will attempt outreach in 1-2 weeks.   Peter Garter RN, Jackquline Denmark, CDE Chronic Care Management Coordinator [place] [facility] [phone]

## 2020-03-05 DIAGNOSIS — E113392 Type 2 diabetes mellitus with moderate nonproliferative diabetic retinopathy without macular edema, left eye: Secondary | ICD-10-CM | POA: Diagnosis not present

## 2020-03-05 DIAGNOSIS — Z961 Presence of intraocular lens: Secondary | ICD-10-CM | POA: Diagnosis not present

## 2020-03-05 DIAGNOSIS — E113311 Type 2 diabetes mellitus with moderate nonproliferative diabetic retinopathy with macular edema, right eye: Secondary | ICD-10-CM | POA: Diagnosis not present

## 2020-03-05 DIAGNOSIS — H35033 Hypertensive retinopathy, bilateral: Secondary | ICD-10-CM | POA: Diagnosis not present

## 2020-03-11 ENCOUNTER — Other Ambulatory Visit: Payer: Self-pay

## 2020-03-11 NOTE — Patient Outreach (Signed)
  [place] [facility]) Care Management Chronic Special Needs Program  03/11/2020  Name: Alex Mccarty DOB: 01/07/1946  MRN: [id number]  [name] is enrolled in a chronic special needs plan for Diabetes. Client called with no answer No answer and HIPAA compliant message left. 2nd attempt Plan for 3rd outreach call in one week Chronic care management coordinator will attempt outreach in one week.   Peter Garter RN, Jackquline Denmark, CDE Chronic Care Management Coordinator [place] [facility] [phone]

## 2020-03-18 ENCOUNTER — Other Ambulatory Visit: Payer: Self-pay

## 2020-03-18 NOTE — Patient Outreach (Signed)
[facility]) Care Management Chronic Special Needs Program  03/18/2020  Name: Alex Mccarty DOB: Jan 20, 1946  MRN: [id number]  Alex Mccarty is enrolled in a chronic special needs plan for  Diabetes.  Client called with no answer and HIPAA compliant message left.  3rd attempt A completed health risk assessment has  been received from the client and client has not responded to 3 outreach attempts by [facility] to update individualized care plan  The client's individualized care plan was developed based on available data and  2021 Health Risk Assessment Goals Addressed            This Visit's Progress   . Client will not report change from baseline and no repeated symptoms of TIA with in the next 12 months   On track    No ED or hospitalizations for TIA symptoms  Keep follow up appointments with neurology regularly Sent EMMI: Transient Ischemic Attack    . Client will verbalize knowledge of self management of Hypertension as evidences by BP reading of 140/90 or less; or as defined by provider   On track    B/P less than 140/90 when checked per records Plan to check B/P regularly Take B/P medications as ordered Plan to follow a low salt diet  Increase activity as tolerated Sent EMMI: High Blood Pressure in Adults    . HEMOGLOBIN A1C < 7       Last viewable Hemoglobin A1C 6.5% 06/04/19 Plan to check blood sugars as directed with goals fasting or 1 1/2 hours after eating with goal of 80-130 fasting and 180 or less after meals Plan to follow a low carbohydrate, low salt diet, watch portion sizes and avoid sugar sweetened drinks    . Obtain annual  Lipid Profile, LDL-C   On track    Completed 01/15/2019 LDL 56 The goal for LDL is less than 70 mg/dL as you are at high risk for complications Try to avoid saturated fats, trans-fats and eat more fiber Plan to take statin as ordered     . Obtain Annual Eye (retinal)  Exam    On track    Completed 09/03/19 Plan to have  a dilated eye exam every year    . Obtain Annual Foot Exam   On track    Reported completed by [facility] Check your skin and feet every day for cuts, bruises, redness, blisters, or sores. Report to provider any problems with your feet Schedule a foot exam with your health care provider once every year    . Obtain annual screen for micro albuminuria (urine) , nephropathy (kidney problems)   On track    Reports completed by [facility] nephrologist  It is important for your doctor to check your urine for protein at least every year    . Obtain Hemoglobin A1C at least 2 times per year   On track    Last completed 06/04/19 It is important to have your Hemoglobin A1C checked every 6 months if you are at goal and every 3 months if you are not at goal    . Visit Primary Care Provider or Endocrinologist at least 2 times per year    On track    Last primary care provider 06/04/19 Please schedule your annual wellness visit        Plan:  . Send unsuccessful outreach letter with a copy of individualized care plan to client . Send individualized care plan to provider . Educational Materials EMMI-High Blood Pressure  in Adults, Transient Ischemic Attack  Client will be outreached by a [employer] [facility] RNCM in 12 months per tier level  [place], [facility] [phone]

## 2020-04-06 DIAGNOSIS — S0501XA Injury of conjunctiva and corneal abrasion without foreign body, right eye, initial encounter: Secondary | ICD-10-CM | POA: Diagnosis not present

## 2020-04-06 DIAGNOSIS — Z961 Presence of intraocular lens: Secondary | ICD-10-CM | POA: Diagnosis not present

## 2020-04-06 DIAGNOSIS — E113393 Type 2 diabetes mellitus with moderate nonproliferative diabetic retinopathy without macular edema, bilateral: Secondary | ICD-10-CM | POA: Diagnosis not present

## 2020-05-07 DIAGNOSIS — Z794 Long term (current) use of insulin: Secondary | ICD-10-CM | POA: Diagnosis not present

## 2020-05-07 DIAGNOSIS — E1122 Type 2 diabetes mellitus with diabetic chronic kidney disease: Secondary | ICD-10-CM | POA: Diagnosis not present

## 2020-05-07 DIAGNOSIS — N183 Chronic kidney disease, stage 3 unspecified: Secondary | ICD-10-CM | POA: Diagnosis not present

## 2020-05-07 DIAGNOSIS — I129 Hypertensive chronic kidney disease with stage 1 through stage 4 chronic kidney disease, or unspecified chronic kidney disease: Secondary | ICD-10-CM | POA: Diagnosis not present

## 2020-05-07 DIAGNOSIS — H1830 Unspecified corneal membrane change: Secondary | ICD-10-CM | POA: Diagnosis not present

## 2020-05-07 DIAGNOSIS — E7800 Pure hypercholesterolemia, unspecified: Secondary | ICD-10-CM | POA: Diagnosis not present

## 2020-05-14 DIAGNOSIS — N183 Chronic kidney disease, stage 3 unspecified: Secondary | ICD-10-CM | POA: Diagnosis not present

## 2020-05-14 DIAGNOSIS — Z794 Long term (current) use of insulin: Secondary | ICD-10-CM | POA: Diagnosis not present

## 2020-05-14 DIAGNOSIS — E1122 Type 2 diabetes mellitus with diabetic chronic kidney disease: Secondary | ICD-10-CM | POA: Diagnosis not present

## 2020-06-04 DIAGNOSIS — M47817 Spondylosis without myelopathy or radiculopathy, lumbosacral region: Secondary | ICD-10-CM | POA: Diagnosis not present

## 2020-06-04 DIAGNOSIS — I129 Hypertensive chronic kidney disease with stage 1 through stage 4 chronic kidney disease, or unspecified chronic kidney disease: Secondary | ICD-10-CM | POA: Diagnosis not present

## 2020-06-04 DIAGNOSIS — M47816 Spondylosis without myelopathy or radiculopathy, lumbar region: Secondary | ICD-10-CM | POA: Diagnosis not present

## 2020-06-04 DIAGNOSIS — M5136 Other intervertebral disc degeneration, lumbar region: Secondary | ICD-10-CM | POA: Diagnosis not present

## 2020-06-08 DIAGNOSIS — Z961 Presence of intraocular lens: Secondary | ICD-10-CM | POA: Diagnosis not present

## 2020-06-08 DIAGNOSIS — E113212 Type 2 diabetes mellitus with mild nonproliferative diabetic retinopathy with macular edema, left eye: Secondary | ICD-10-CM | POA: Diagnosis not present

## 2020-06-08 DIAGNOSIS — E113311 Type 2 diabetes mellitus with moderate nonproliferative diabetic retinopathy with macular edema, right eye: Secondary | ICD-10-CM | POA: Diagnosis not present

## 2020-06-08 DIAGNOSIS — H35033 Hypertensive retinopathy, bilateral: Secondary | ICD-10-CM | POA: Diagnosis not present

## 2020-07-23 DIAGNOSIS — Z96641 Presence of right artificial hip joint: Secondary | ICD-10-CM | POA: Diagnosis not present

## 2020-07-23 DIAGNOSIS — T8484XA Pain due to internal orthopedic prosthetic devices, implants and grafts, initial encounter: Secondary | ICD-10-CM | POA: Diagnosis not present

## 2020-07-27 DIAGNOSIS — H3561 Retinal hemorrhage, right eye: Secondary | ICD-10-CM | POA: Diagnosis not present

## 2020-07-27 DIAGNOSIS — E113311 Type 2 diabetes mellitus with moderate nonproliferative diabetic retinopathy with macular edema, right eye: Secondary | ICD-10-CM | POA: Diagnosis not present

## 2020-07-27 DIAGNOSIS — Z961 Presence of intraocular lens: Secondary | ICD-10-CM | POA: Diagnosis not present

## 2020-07-28 ENCOUNTER — Other Ambulatory Visit: Payer: Self-pay

## 2020-07-28 NOTE — Patient Outreach (Signed)
  [facility] [facility]) Care Management Chronic Special Needs Program    07/28/2020  Name: NISHAWN ROTAN, DOB: 06-01-1946  MRN: [id number]   Mr. Daniela Hernan is enrolled in a chronic special needs plan for Diabetes.  [employer] Management will continue to provide services for this client through 09/18/2020. The [employer] care management team will assume care 09/19/2020.  Peter Garter RN, Jackquline Denmark, CDE Chronic Care Management Coordinator [place] [facility] [phone]

## 2020-07-29 DIAGNOSIS — E113311 Type 2 diabetes mellitus with moderate nonproliferative diabetic retinopathy with macular edema, right eye: Secondary | ICD-10-CM | POA: Diagnosis not present

## 2020-07-29 DIAGNOSIS — E113392 Type 2 diabetes mellitus with moderate nonproliferative diabetic retinopathy without macular edema, left eye: Secondary | ICD-10-CM | POA: Diagnosis not present

## 2020-08-03 ENCOUNTER — Encounter (INDEPENDENT_AMBULATORY_CARE_PROVIDER_SITE_OTHER): Payer: Self-pay | Admitting: Ophthalmology

## 2020-08-03 ENCOUNTER — Ambulatory Visit (INDEPENDENT_AMBULATORY_CARE_PROVIDER_SITE_OTHER): Payer: HMO | Admitting: Ophthalmology

## 2020-08-03 ENCOUNTER — Encounter (INDEPENDENT_AMBULATORY_CARE_PROVIDER_SITE_OTHER): Payer: HMO | Admitting: Ophthalmology

## 2020-08-03 ENCOUNTER — Other Ambulatory Visit: Payer: Self-pay

## 2020-08-03 DIAGNOSIS — G4730 Sleep apnea, unspecified: Secondary | ICD-10-CM | POA: Insufficient documentation

## 2020-08-03 DIAGNOSIS — E113411 Type 2 diabetes mellitus with severe nonproliferative diabetic retinopathy with macular edema, right eye: Secondary | ICD-10-CM | POA: Insufficient documentation

## 2020-08-03 DIAGNOSIS — E113412 Type 2 diabetes mellitus with severe nonproliferative diabetic retinopathy with macular edema, left eye: Secondary | ICD-10-CM | POA: Insufficient documentation

## 2020-08-03 MED ORDER — FLUORESCEIN SODIUM 10 % IV SOLN
500.0000 mg | INTRAVENOUS | Status: AC | PRN
  Administered 2020-08-03: 500 mg via INTRAVENOUS

## 2020-08-03 NOTE — Progress Notes (Signed)
08/03/2020     CHIEF COMPLAINT Patient presents for Retina Evaluation   HISTORY OF PRESENT ILLNESS: Alex Mccarty is a 74 y.o. male who presents to the clinic today for:   HPI    Retina Evaluation    In right eye.  This started 2 years ago.  Duration of 2 years.  Associated Symptoms Blind Spot.  Context:  distance vision, mid-range vision and near vision.  Treatments tried include no treatments.  Response to treatment was no improvement.          Comments    NP lamellar hole, DME OD - Refd by Dr. Sharen Counter  Pt c/o blind spot OD gradually worsening x 2 years. Pt denies flashes, floaters, or ocular pain. VA OS stable. A1c: 6.7, 03/2020 LBS: 128 checked 2 days ago       Last edited by Rockie Neighbours, [place] on 08/03/2020  8:45 AM. (History)      Referring physician: Delilah Shan, MD No address on file  HISTORICAL INFORMATION:   Selected notes from the [facility]    Lab Results  Component Value Date   HGBA1C 7.0 (H) 09/28/2017     CURRENT MEDICATIONS: No current outpatient medications on file. (Ophthalmic Drugs)   No current facility-administered medications for this visit. (Ophthalmic Drugs)   Current Outpatient Medications (Other)  Medication Sig  . ASPIRIN LOW DOSE 81 MG EC tablet Take 81 mg by mouth at bedtime.   [name] atorvastatin (LIPITOR) 80 MG tablet Take 80 mg by mouth at bedtime.   . cetirizine (ZYRTEC) 10 MG tablet Take 10 mg by mouth daily as needed.   . Cholecalciferol (VITAMIN D3) 50 MCG (2000 UT) TABS Take 1 tablet by mouth daily.  . clopidogrel (PLAVIX) 75 MG tablet Take 75 mg by mouth every morning.  . cyclobenzaprine (FLEXERIL) 10 MG tablet Take 10 mg by mouth as needed (muscle pain).   . ferrous sulfate 325 (65 FE) MG tablet Take 325 mg by mouth as directed. On Monday, Wednesday and Friday at bedtime  . gabapentin (NEURONTIN) 300 MG capsule Take 300 mg by mouth at bedtime.  . Glucosamine-Chondroitin (COSAMIN DS PO) Take 1 capsule by  mouth at bedtime.  [name] LANTUS SOLOSTAR 100 UNIT/ML Solostar Pen Inject 45 Units into the skin every morning.   [name] losartan (COZAAR) 100 MG tablet Take 100 mg by mouth in the morning.   [name] omeprazole (PRILOSEC) 40 MG capsule Take 40 mg by mouth every morning.  . Polyethylene Glycol 3350 (MIRALAX PO) Take by mouth at bedtime.  . potassium citrate (UROCIT-K) 5 MEQ (540 MG) SR tablet Take 15 mEq by mouth every morning.  . propranolol (INDERAL) 20 MG tablet Take 40-60 mg by mouth every morning.  [name] tiZANidine (ZANAFLEX) 4 MG tablet Take 4 mg by mouth every 6 (six) hours as needed for muscle spasms.   No current facility-administered medications for this visit. (Other)      REVIEW OF SYSTEMS:    ALLERGIES Allergies  Allergen Reactions  . Amlodipine Besylate Other (See Comments)    HIVES  . Codeine Nausea And Vomiting  . Penicillins Other (See Comments)    Childhood reaction Has patient had a PCN reaction causing immediate rash, facial/tongue/throat swelling, SOB or lightheadedness with hypotension: Unknown Has patient had a PCN reaction causing severe rash involving mucus membranes or skin necrosis: Unknown Has patient had a PCN reaction that required hospitalization: Unknown Has patient had a PCN reaction occurring within the last  10 years: Unknown If all of the above answers are "NO", then may proceed with Cephalosporin use.    . Sulfa Antibiotics Nausea And Vomiting    PAST MEDICAL HISTORY Past Medical History:  Diagnosis Date  . Arthritis   . Bowel obstruction ([place])   . CKD (chronic kidney disease)    unkknown stage per patient  . Diabetes mellitus ([place])   . Hyperlipidemia   . Hypertension   . Kidney stones    Past Surgical History:  Procedure Laterality Date  . COLONOSCOPY  10/2015   Dr Kenton Kingfisher  . COLOSTOMY REVISION Right 09/29/2017   Procedure: COLON RESECTION RIGHT;  Surgeon: Jovita Kussmaul, MD;  Location: [facility];  Service: General;  Laterality: Right;  . ELBOW  SURGERY     ulnar nerve repair  . ESOPHAGOGASTRODUODENOSCOPY     same time   . FOOT SURGERY     crushed heel  . LAPAROTOMY N/A 09/29/2017   Procedure: EXPLORATORY LAPAROTOMY;  Surgeon: Jovita Kussmaul, MD;  Location: [facility];  Service: General;  Laterality: N/A;    FAMILY HISTORY Family History  Problem Relation Age of Onset  . Kidney Stones Mother   . Heart disease Mother   . Colon cancer Neg Hx   . Esophageal cancer Neg Hx     SOCIAL HISTORY Social History   Tobacco Use  . Smoking status: Former Research scientist (life sciences)  . Smokeless tobacco: Never Used  . Tobacco comment: quit over 40 years ago.Around age [age]   Vaping Use  . Vaping Use: Never used  Substance Use Topics  . Alcohol use: Yes    Alcohol/week: 0.0 standard drinks    Comment: occasional  . Drug use: No         OPHTHALMIC EXAM:  Base Eye Exam    Visual Acuity (ETDRS)      Right Left   Dist cc 20/60ecc 20/20 -2   Dist ph cc 20/60ecc +2    Correction: Glasses       Tonometry (Tonopen, 8:45 AM)      Right Left   Pressure 15 16       Pupils      Pupils Dark Light Shape React APD   Right PERRL 3 2 Round Brisk None   Left PERRL 3 2 Round Brisk None       Visual Fields (Counting fingers)      Left Right    Full Full       Extraocular Movement      Right Left    Full Full       Neuro/Psych    Oriented x3: Yes   Mood/Affect: Normal       Dilation    Both eyes: 1.0% Mydriacyl, 2.5% Phenylephrine @ 8:49 AM        Slit Lamp and Fundus Exam    External Exam      Right Left   External Normal Normal       Slit Lamp Exam      Right Left   Lids/Lashes Normal Normal   Conjunctiva/Sclera White and quiet White and quiet   Cornea Clear Clear   Anterior Chamber Deep and quiet Deep and quiet   Iris Round and reactive Round and reactive   Lens Centered posterior chamber intraocular lens Centered posterior chamber intraocular lens   Anterior Vitreous Normal Normal       Fundus Exam      Right Left    Posterior Vitreous Normal Normal  Disc Normal Normal   C/D Ratio 0.3 0.2   Macula Microaneurysms, Mild clinically significant macular edema Microaneurysms, Mild clinically significant macular edema   Vessels NPDR- Moderate NPDR- Moderate   Periphery Normal Normal          IMAGING AND PROCEDURES  Imaging and Procedures for 08/03/20  OCT, Retina - OU - Both Eyes       Right Eye Quality was good. Scan locations included subfoveal. Central Foveal Thickness: 326. Progression has no prior data. Findings include vitreomacular adhesion , abnormal foveal contour.   Left Eye Quality was good. Scan locations included subfoveal. Central Foveal Thickness: 291. Progression has no prior data. Findings include vitreomacular adhesion .   Notes Bilateral CSME, significant temporal OD and moderate temporally OS       Color Fundus Photography Optos - OU - Both Eyes       Right Eye Disc findings include normal observations. Macula : microaneurysms, edema.   Left Eye Progression has no prior data. Disc findings include normal observations. Macula : microaneurysms, edema.   Notes Severe nonproliferative diabetic retinopathy, CSME OU       Fluorescein Angiography Optos (Transit OD)       Injection:  500 mg Fluorescein Sodium 10 % injection   NDC: [phone]   Route: Intravenous, Site: Left ArmRight Eye   Progression has no prior data. Mid/Late phase findings include leakage, microaneurysm.   Left Eye   Progression has no prior data. Mid/Late phase findings include leakage, microaneurysm.   Notes CSME OD, extensive mid peripheral retinal nonperfusion Pronounced nasally  OS CSME, similar mid peripheral retinal nonperfusion peripherally                ASSESSMENT/PLAN:  Sleep apnea I encouraged patient and family to seek sleep apnea testing in urgent fashion.  Superimposed hypertensive retinopathy on severe nonproliferative diabetic retinopathy is often a sign of  untreated sleep apnea because of the nightly episodic hypertension superimposed upon retinopathy.  Moreover maximum oxygenation through the daytime and particularly throughout the night will slow progression of diabetic eye disease as well as other complications and also improved CNS functioning      ICD-10-CM   1. Severe nonproliferative diabetic retinopathy of right eye, with macular edema, associated with type 2 diabetes mellitus (HCC)  E11.3411 OCT, Retina - OU - Both Eyes    Color Fundus Photography Optos - OU - Both Eyes    Fluorescein Angiography Optos (Transit OD)    Fluorescein Sodium 10 % injection 500 mg  2. Severe nonproliferative diabetic retinopathy of left eye, with macular edema, associated with type 2 diabetes mellitus (HCC)  X52.8413 OCT, Retina - OU - Both Eyes    Color Fundus Photography Optos - OU - Both Eyes    Fluorescein Angiography Optos (Transit OD)    Fluorescein Sodium 10 % injection 500 mg  3. Sleep apnea, unspecified type  G47.30     1.  Diabetic macular edema the right eye significant will need to commence with intravitreal Avastin in the coming days  2.  OS, might be able to be addressed the CSME with focal laser  3.  Sleep apnea, and I do urge proceeding with testing in order to evaluate whether or not nighttime hypoxia is driving progression of his retinopathy and macular edema  Ophthalmic Meds Ordered this visit:  Meds ordered this encounter  Medications  . Fluorescein Sodium 10 % injection 500 mg       Return in about 2 days (  around 08/05/2020) for AVASTIN OCT, OD.  Patient Instructions  Diabetic Retinopathy Diabetic retinopathy is a disease of the retina. The retina is a light-sensitive membrane at the back of the eye. Retinopathy is a complication of diabetes (diabetes mellitus) and a common cause of bad eyesight (visual impairment). It can eventually cause blindness. Early detection and treatment of diabetic retinopathy is important in keeping  your eyes healthy and preventing further damage to them. What are the causes? Diabetic retinopathy is caused by blood sugar (glucose) levels that are too high for an extended period of time. High blood glucose over an extended period of time can:  Damage small blood vessels in the retina, allowing blood to leak through the vessel walls.  Cause new, abnormal blood vessels to grow on the retina. This can scar the retina in the advanced stage of diabetic retinopathy. What increases the risk? You are more likely to develop this condition if:  You have had diabetes for a long time.  You have poorly controlled blood glucose.  You have high blood pressure. What are the signs or symptoms? In the early stages of diabetic retinopathy, there are often no symptoms. As the condition gets worse, symptoms may include:  Blurred vision. This is usually caused by swelling due to abnormal blood glucose levels. The blurriness may go away when blood glucose levels return to normal.  Moving specks or dark spots (floaters) in your vision. These can be caused by a small amount of bleeding (hemorrhage) from retinal blood vessels.  Missing parts of your field of vision, such as vision at the sides of the eyes. This can be caused by larger retinal hemorrhages.  Difficulty reading.  Double vision.  Pain in one or both eyes.  Feeling pressure in one or both eyes.  Trouble seeing straight lines. Straight lines may not look straight.  Redness of the eyes that does not go away. How is this diagnosed? This condition may be diagnosed with an eye exam in which your eye care specialist puts drops in your eyes that enlarge (dilate) your pupils. This lets your health care provider examine your retina and check for changes in your retinal blood vessels. How is this treated? This condition may be treated by:  Keeping your blood glucose and blood pressure within a target range.  Using a type of laser beam to seal  your retinal blood vessels. This stops them from bleeding and decreases pressure in your eye.  Getting shots of medicine in the eye to reduce swelling of the center of the retina (macula). You may be given: ? Anti-VEGF medicine. This medicine can help slow vision loss, and may even improve vision. ? Steroid medicine. Follow these instructions at home:   Follow your diabetes management plan as directed by your health care provider. This may include exercising regularly and eating a healthy diet.  Keep your blood glucose level and your blood pressure in your target range, as directed by your health care provider.  Check your blood glucose as often as directed.  Take over the counter and prescription medicines only as told by your health care provider. This includes insulin and oral diabetes medicine.  Get your eyes checked at least once every year. An eye specialist can usually see diabetic retinopathy developing long before it starts to cause problems. In many cases, it can be treated to prevent complications from occurring.  Do not use any products that contain nicotine or tobacco, such as cigarettes and e-cigarettes. If you need  help quitting, ask your health care provider.  Keep all follow-up visits as told by your health care provider. This is important. Contact a health care provider if:  You notice gradual blurring or other changes in your vision over time.  You notice that your glasses or contact lenses do not make things look as sharp as they once did.  You have trouble reading or seeing details at a distance with either eye.  You notice a change in your vision or notice that parts of your field of vision appear missing or hazy.  You suddenly see moving specks or dark spots in the field of vision of either eye. Get help right away if:  You have sudden pain or pressure in one or both eyes.  You suddenly lose vision or a curtain or veil seems to come across your eyes.  You  have a sudden burst of floaters in your vision. Summary  Diabetic retinopathy is a disease of the retina. The retina is a light-sensitive membrane at the back of the eye. Retinopathy is a complication of diabetes.  Get your eyes checked at least once every year. An eye specialist can usually see diabetic retinopathy developing long before it starts to cause problems. In many cases, it can be treated to prevent complications from occurring.  Keep your blood glucose and your blood pressure in target range. Follow your diabetes management plan as directed by your health care provider.  Protect your eyes. Wear sunglasses and eye protection when needed. This information is not intended to replace advice given to you by your health care provider. Make sure you discuss any questions you have with your health care provider. Document Revised: 10/18/2017 Document Reviewed: 10/10/2016 Elsevier Patient Education  2020 [employer].     Explained the diagnoses, plan, and follow up with the patient and they expressed understanding.  Patient expressed understanding of the importance of proper follow up care.   Clent Demark Rogelio Waynick M.D. Diseases & Surgery of the Retina and Vitreous [facility]ina [facility] Diabetic [place] 08/03/20     Abbreviations: M myopia (nearsighted); A astigmatism; H hyperopia (farsighted); P presbyopia; Mrx spectacle prescription;  CTL contact lenses; OD right eye; OS left eye; OU both eyes  XT exotropia; ET esotropia; PEK punctate epithelial keratitis; PEE punctate epithelial erosions; DES dry eye syndrome; MGD meibomian gland dysfunction; ATs artificial tears; PFAT's preservative free artificial tears; [place] nuclear sclerotic cataract; PSC posterior subcapsular cataract; ERM epi-retinal membrane; PVD posterior vitreous detachment; RD retinal detachment; DM diabetes mellitus; DR diabetic retinopathy; NPDR non-proliferative diabetic retinopathy; PDR proliferative diabetic retinopathy; CSME  clinically significant macular edema; DME diabetic macular edema; dbh dot blot hemorrhages; CWS cotton wool spot; POAG primary open angle glaucoma; C/D cup-to-disc ratio; HVF humphrey visual field; GVF goldmann visual field; OCT optical coherence tomography; IOP intraocular pressure; BRVO Branch retinal vein occlusion; CRVO central retinal vein occlusion; CRAO central retinal artery occlusion; BRAO branch retinal artery occlusion; RT retinal tear; SB scleral buckle; PPV pars plana vitrectomy; VH Vitreous hemorrhage; PRP panretinal laser photocoagulation; IVK intravitreal kenalog; VMT vitreomacular traction; MH Macular hole;  NVD neovascularization of the disc; NVE neovascularization elsewhere; AREDS age related eye disease study; ARMD age related macular degeneration; POAG primary open angle glaucoma; EBMD epithelial/anterior basement membrane dystrophy; ACIOL anterior chamber intraocular lens; IOL intraocular lens; PCIOL posterior chamber intraocular lens; Phaco/IOL phacoemulsification with intraocular lens placement; [place] photorefractive keratectomy; LASIK laser assisted in situ keratomileusis; HTN hypertension; DM diabetes mellitus; COPD chronic obstructive pulmonary disease

## 2020-08-03 NOTE — Patient Instructions (Signed)
Diabetic Retinopathy Diabetic retinopathy is a disease of the retina. The retina is a light-sensitive membrane at the back of the eye. Retinopathy is a complication of diabetes (diabetes mellitus) and a common cause of bad eyesight (visual impairment). It can eventually cause blindness. Early detection and treatment of diabetic retinopathy is important in keeping your eyes healthy and preventing further damage to them. What are the causes? Diabetic retinopathy is caused by blood sugar (glucose) levels that are too high for an extended period of time. High blood glucose over an extended period of time can:  Damage small blood vessels in the retina, allowing blood to leak through the vessel walls.  Cause new, abnormal blood vessels to grow on the retina. This can scar the retina in the advanced stage of diabetic retinopathy. What increases the risk? You are more likely to develop this condition if:  You have had diabetes for a long time.  You have poorly controlled blood glucose.  You have high blood pressure. What are the signs or symptoms? In the early stages of diabetic retinopathy, there are often no symptoms. As the condition gets worse, symptoms may include:  Blurred vision. This is usually caused by swelling due to abnormal blood glucose levels. The blurriness may go away when blood glucose levels return to normal.  Moving specks or dark spots (floaters) in your vision. These can be caused by a small amount of bleeding (hemorrhage) from retinal blood vessels.  Missing parts of your field of vision, such as vision at the sides of the eyes. This can be caused by larger retinal hemorrhages.  Difficulty reading.  Double vision.  Pain in one or both eyes.  Feeling pressure in one or both eyes.  Trouble seeing straight lines. Straight lines may not look straight.  Redness of the eyes that does not go away. How is this diagnosed? This condition may be diagnosed with an eye exam in  which your eye care specialist puts drops in your eyes that enlarge (dilate) your pupils. This lets your health care provider examine your retina and check for changes in your retinal blood vessels. How is this treated? This condition may be treated by:  Keeping your blood glucose and blood pressure within a target range.  Using a type of laser beam to seal your retinal blood vessels. This stops them from bleeding and decreases pressure in your eye.  Getting shots of medicine in the eye to reduce swelling of the center of the retina (macula). You may be given: ? Anti-VEGF medicine. This medicine can help slow vision loss, and may even improve vision. ? Steroid medicine. Follow these instructions at home:   Follow your diabetes management plan as directed by your health care provider. This may include exercising regularly and eating a healthy diet.  Keep your blood glucose level and your blood pressure in your target range, as directed by your health care provider.  Check your blood glucose as often as directed.  Take over the counter and prescription medicines only as told by your health care provider. This includes insulin and oral diabetes medicine.  Get your eyes checked at least once every year. An eye specialist can usually see diabetic retinopathy developing long before it starts to cause problems. In many cases, it can be treated to prevent complications from occurring.  Do not use any products that contain nicotine or tobacco, such as cigarettes and e-cigarettes. If you need help quitting, ask your health care provider.  Keep all follow-up   visits as told by your health care provider. This is important. Contact a health care provider if:  You notice gradual blurring or other changes in your vision over time.  You notice that your glasses or contact lenses do not make things look as sharp as they once did.  You have trouble reading or seeing details at a distance with either  eye.  You notice a change in your vision or notice that parts of your field of vision appear missing or hazy.  You suddenly see moving specks or dark spots in the field of vision of either eye. Get help right away if:  You have sudden pain or pressure in one or both eyes.  You suddenly lose vision or a curtain or veil seems to come across your eyes.  You have a sudden burst of floaters in your vision. Summary  Diabetic retinopathy is a disease of the retina. The retina is a light-sensitive membrane at the back of the eye. Retinopathy is a complication of diabetes.  Get your eyes checked at least once every year. An eye specialist can usually see diabetic retinopathy developing long before it starts to cause problems. In many cases, it can be treated to prevent complications from occurring.  Keep your blood glucose and your blood pressure in target range. Follow your diabetes management plan as directed by your health care provider.  Protect your eyes. Wear sunglasses and eye protection when needed. This information is not intended to replace advice given to you by your health care provider. Make sure you discuss any questions you have with your health care provider. Document Revised: 10/18/2017 Document Reviewed: 10/10/2016 Elsevier Patient Education  2020 [employer]  

## 2020-08-03 NOTE — Assessment & Plan Note (Signed)
I encouraged patient and family to seek sleep apnea testing in urgent fashion.  Superimposed hypertensive retinopathy on severe nonproliferative diabetic retinopathy is often a sign of untreated sleep apnea because of the nightly episodic hypertension superimposed upon retinopathy.  Moreover maximum oxygenation through the daytime and particularly throughout the night will slow progression of diabetic eye disease as well as other complications and also improved CNS functioning

## 2020-08-05 ENCOUNTER — Encounter (INDEPENDENT_AMBULATORY_CARE_PROVIDER_SITE_OTHER): Payer: Self-pay | Admitting: Ophthalmology

## 2020-08-05 ENCOUNTER — Other Ambulatory Visit: Payer: Self-pay

## 2020-08-05 ENCOUNTER — Ambulatory Visit (INDEPENDENT_AMBULATORY_CARE_PROVIDER_SITE_OTHER): Payer: No Typology Code available for payment source | Admitting: Ophthalmology

## 2020-08-05 DIAGNOSIS — E113411 Type 2 diabetes mellitus with severe nonproliferative diabetic retinopathy with macular edema, right eye: Secondary | ICD-10-CM

## 2020-08-05 DIAGNOSIS — R0683 Snoring: Secondary | ICD-10-CM

## 2020-08-05 MED ORDER — BEVACIZUMAB CHEMO INJECTION 1.25MG/0.05ML SYRINGE FOR KALEIDOSCOPE
1.2500 mg | INTRAVITREAL | Status: AC | PRN
  Administered 2020-08-05: 1.25 mg via INTRAVITREAL

## 2020-08-05 NOTE — Progress Notes (Signed)
08/05/2020     CHIEF COMPLAINT Patient presents for Retina Follow Up   HISTORY OF PRESENT ILLNESS: Alex Mccarty is a 74 y.o. male who presents to the clinic today for:   HPI    Retina Follow Up    Patient presents with  Diabetic Retinopathy.  In right eye.  This started 2 days ago.  Severity is mild.  Duration of 2 days.  Since onset it is stable.          Comments    2 Day Avastin OD  Pt denies noticeable changes to [place] OU since last visit. Pt denies ocular pain, flashes of light, or floaters OU.  LBS: did not check       Last edited by Rockie Neighbours, [place] on 08/05/2020  3:09 PM. (History)      Referring physician: Elisabeth Cara, PA-C [address] [place],  [place] [address]  HISTORICAL INFORMATION:   Selected notes from the [facility]    Lab Results  Component Value Date   HGBA1C 7.0 (H) 09/28/2017     CURRENT MEDICATIONS: No current outpatient medications on file. (Ophthalmic Drugs)   No current facility-administered medications for this visit. (Ophthalmic Drugs)   Current Outpatient Medications (Other)  Medication Sig  . ASPIRIN LOW DOSE 81 MG EC tablet Take 81 mg by mouth at bedtime.   [name] atorvastatin (LIPITOR) 80 MG tablet Take 80 mg by mouth at bedtime.   . cetirizine (ZYRTEC) 10 MG tablet Take 10 mg by mouth daily as needed.   . Cholecalciferol (VITAMIN D3) 50 MCG (2000 UT) TABS Take 1 tablet by mouth daily.  . clopidogrel (PLAVIX) 75 MG tablet Take 75 mg by mouth every morning.  . cyclobenzaprine (FLEXERIL) 10 MG tablet Take 10 mg by mouth as needed (muscle pain).   . ferrous sulfate 325 (65 FE) MG tablet Take 325 mg by mouth as directed. On Monday, Wednesday and Friday at bedtime  . gabapentin (NEURONTIN) 300 MG capsule Take 300 mg by mouth at bedtime.  . Glucosamine-Chondroitin (COSAMIN DS PO) Take 1 capsule by mouth at bedtime.  [name] LANTUS SOLOSTAR 100 UNIT/ML Solostar Pen Inject 45 Units into the skin every  morning.   [name] losartan (COZAAR) 100 MG tablet Take 100 mg by mouth in the morning.   [name] omeprazole (PRILOSEC) 40 MG capsule Take 40 mg by mouth every morning.  . Polyethylene Glycol 3350 (MIRALAX PO) Take by mouth at bedtime.  . potassium citrate (UROCIT-K) 5 MEQ (540 MG) SR tablet Take 15 mEq by mouth every morning.  . propranolol (INDERAL) 20 MG tablet Take 40-60 mg by mouth every morning.  [name] tiZANidine (ZANAFLEX) 4 MG tablet Take 4 mg by mouth every 6 (six) hours as needed for muscle spasms.   No current facility-administered medications for this visit. (Other)      REVIEW OF SYSTEMS:    ALLERGIES Allergies  Allergen Reactions  . Amlodipine Besylate Other (See Comments)    HIVES  . Codeine Nausea And Vomiting  . Penicillins Other (See Comments)    Childhood reaction Has patient had a PCN reaction causing immediate rash, facial/tongue/throat swelling, SOB or lightheadedness with hypotension: Unknown Has patient had a PCN reaction causing severe rash involving mucus membranes or skin necrosis: Unknown Has patient had a PCN reaction that required hospitalization: Unknown Has patient had a PCN reaction occurring within the last 10 years: Unknown If all of the above answers are "NO", then may  proceed with Cephalosporin use.    . Sulfa Antibiotics Nausea And Vomiting    PAST MEDICAL HISTORY Past Medical History:  Diagnosis Date  . Arthritis   . Bowel obstruction ([place])   . CKD (chronic kidney disease)    unkknown stage per patient  . Diabetes mellitus ([place])   . Hyperlipidemia   . Hypertension   . Kidney stones    Past Surgical History:  Procedure Laterality Date  . COLONOSCOPY  10/2015   Dr Kenton Kingfisher  . COLOSTOMY REVISION Right 09/29/2017   Procedure: COLON RESECTION RIGHT;  Surgeon: Jovita Kussmaul, MD;  Location: [facility];  Service: General;  Laterality: Right;  . ELBOW SURGERY     ulnar nerve repair  . ESOPHAGOGASTRODUODENOSCOPY     same time   . FOOT SURGERY      crushed heel  . LAPAROTOMY N/A 09/29/2017   Procedure: EXPLORATORY LAPAROTOMY;  Surgeon: Jovita Kussmaul, MD;  Location: [facility];  Service: General;  Laterality: N/A;    FAMILY HISTORY Family History  Problem Relation Age of Onset  . Kidney Stones Mother   . Heart disease Mother   . Colon cancer Neg Hx   . Esophageal cancer Neg Hx     SOCIAL HISTORY Social History   Tobacco Use  . Smoking status: Former Research scientist (life sciences)  . Smokeless tobacco: Never Used  . Tobacco comment: quit over 40 years ago.Around age [age]   Vaping Use  . Vaping Use: Never used  Substance Use Topics  . Alcohol use: Yes    Alcohol/week: 0.0 standard drinks    Comment: occasional  . Drug use: No         OPHTHALMIC EXAM:  Base Eye Exam    Visual Acuity (ETDRS)      Right Left   Dist cc 20/60ecc +1 20/20   Dist ph cc NI    Correction: Glasses       Tonometry (Tonopen, 3:09 PM)      Right Left   Pressure 11 11       Pupils      Pupils Dark Light Shape React APD   Right PERRL 3 2 Round Brisk None   Left PERRL 3 2 Round Brisk None       Visual Fields (Counting fingers)      Left Right    Full Full       Extraocular Movement      Right Left    Full Full       Neuro/Psych    Oriented x3: Yes   Mood/Affect: Normal       Dilation    Right eye: 1.0% Mydriacyl, 2.5% Phenylephrine @ 3:12 PM        Slit Lamp and Fundus Exam    External Exam      Right Left   External Normal Normal       Slit Lamp Exam      Right Left   Lids/Lashes Normal Normal   Conjunctiva/Sclera White and quiet White and quiet   Cornea Clear Clear   Anterior Chamber Deep and quiet Deep and quiet   Iris Round and reactive Round and reactive   Lens Centered posterior chamber intraocular lens Centered posterior chamber intraocular lens   Anterior Vitreous Normal Normal       Fundus Exam      Right Left   Posterior Vitreous Normal    Disc Normal    C/D Ratio 0.3    Macula Microaneurysms, Mild  clinically  significant macular edema    Vessels NPDR- Moderate    Periphery Normal           IMAGING AND PROCEDURES  Imaging and Procedures for 08/05/20  Intravitreal Injection, Pharmacologic Agent - OD - Right Eye       Time Out 08/05/2020. 3:19 PM. Confirmed correct patient, procedure, site, and patient consented.   Anesthesia Topical anesthesia was used. Anesthetic medications included Akten 3.5%.   Procedure Preparation included Tobramycin 0.3%. A 30 gauge needle was used.   Injection:  1.25 mg Bevacizumab (AVASTIN) SOLN   NDC: 70360-001-02, Lot: [id number]   Route: Intravitreal, Site: Right Eye, Waste: 0 mg  Post-op Post injection exam found visual acuity of at least counting fingers. The patient tolerated the procedure well. There were no complications. The patient received written and verbal post procedure care education. Post injection medications were not given.                 ASSESSMENT/PLAN:  Snores Per family member wife, snores significantly, patient has a review of systems significant for possible sleep apnea, testing is moving forward soon  Severe nonproliferative diabetic retinopathy of right eye, with macular edema, associated with type 2 diabetes mellitus (HCC) Intravitreal Avastin OD today      ICD-10-CM   1. Severe nonproliferative diabetic retinopathy of right eye, with macular edema, associated with type 2 diabetes mellitus (HCC)  E11.3411 Intravitreal Injection, Pharmacologic Agent - OD - Right Eye    Bevacizumab (AVASTIN) SOLN 1.25 mg    CANCELED: OCT, Retina - OU - Both Eyes  2. Snores  R06.83     1.  2.  3.  Ophthalmic Meds Ordered this visit:  Meds ordered this encounter  Medications  . Bevacizumab (AVASTIN) SOLN 1.25 mg       Return in about 6 weeks (around 09/16/2020) for dilate, OD, AVASTIN OCT.  There are no Patient Instructions on file for this visit.   Explained the diagnoses, plan, and follow up with the patient and they  expressed understanding.  Patient expressed understanding of the importance of proper follow up care.   Clent Demark Eudell Mcphee M.D. Diseases & Surgery of the Retina and Vitreous Retina [facility] Diabetic [place] 08/05/20     Abbreviations: M myopia (nearsighted); A astigmatism; H hyperopia (farsighted); P presbyopia; Mrx spectacle prescription;  CTL contact lenses; OD right eye; OS left eye; OU both eyes  XT exotropia; ET esotropia; PEK punctate epithelial keratitis; PEE punctate epithelial erosions; DES dry eye syndrome; MGD meibomian gland dysfunction; ATs artificial tears; PFAT's preservative free artificial tears; [place] nuclear sclerotic cataract; PSC posterior subcapsular cataract; ERM epi-retinal membrane; PVD posterior vitreous detachment; RD retinal detachment; DM diabetes mellitus; DR diabetic retinopathy; NPDR non-proliferative diabetic retinopathy; PDR proliferative diabetic retinopathy; CSME clinically significant macular edema; DME diabetic macular edema; dbh dot blot hemorrhages; CWS cotton wool spot; POAG primary open angle glaucoma; C/D cup-to-disc ratio; HVF humphrey visual field; GVF goldmann visual field; OCT optical coherence tomography; IOP intraocular pressure; BRVO Branch retinal vein occlusion; CRVO central retinal vein occlusion; CRAO central retinal artery occlusion; BRAO branch retinal artery occlusion; RT retinal tear; SB scleral buckle; PPV pars plana vitrectomy; VH Vitreous hemorrhage; PRP panretinal laser photocoagulation; IVK intravitreal kenalog; VMT vitreomacular traction; MH Macular hole;  NVD neovascularization of the disc; NVE neovascularization elsewhere; AREDS age related eye disease study; ARMD age related macular degeneration; POAG primary open angle glaucoma; EBMD epithelial/anterior basement membrane dystrophy; ACIOL anterior chamber intraocular lens; IOL intraocular lens;  PCIOL posterior chamber intraocular lens; Phaco/IOL phacoemulsification with intraocular lens placement;  [place] photorefractive keratectomy; LASIK laser assisted in situ keratomileusis; HTN hypertension; DM diabetes mellitus; COPD chronic obstructive pulmonary disease

## 2020-08-05 NOTE — Assessment & Plan Note (Signed)
Intravitreal Avastin OD today

## 2020-08-05 NOTE — Assessment & Plan Note (Signed)
Per family member wife, snores significantly, patient has a review of systems significant for possible sleep apnea, testing is moving forward soon

## 2020-08-06 ENCOUNTER — Other Ambulatory Visit (HOSPITAL_BASED_OUTPATIENT_CLINIC_OR_DEPARTMENT_OTHER): Payer: Self-pay

## 2020-08-06 DIAGNOSIS — G4733 Obstructive sleep apnea (adult) (pediatric): Secondary | ICD-10-CM

## 2020-08-08 ENCOUNTER — Ambulatory Visit (HOSPITAL_BASED_OUTPATIENT_CLINIC_OR_DEPARTMENT_OTHER): Payer: No Typology Code available for payment source | Attending: Internal Medicine | Admitting: Internal Medicine

## 2020-08-08 ENCOUNTER — Other Ambulatory Visit: Payer: Self-pay

## 2020-08-08 VITALS — Ht 69.0 in | Wt 178.0 lb

## 2020-08-08 DIAGNOSIS — R0683 Snoring: Secondary | ICD-10-CM | POA: Insufficient documentation

## 2020-08-08 DIAGNOSIS — G4733 Obstructive sleep apnea (adult) (pediatric): Secondary | ICD-10-CM | POA: Diagnosis present

## 2020-08-10 ENCOUNTER — Other Ambulatory Visit (HOSPITAL_BASED_OUTPATIENT_CLINIC_OR_DEPARTMENT_OTHER): Payer: Self-pay

## 2020-08-10 DIAGNOSIS — G4733 Obstructive sleep apnea (adult) (pediatric): Secondary | ICD-10-CM

## 2020-08-17 ENCOUNTER — Other Ambulatory Visit (HOSPITAL_BASED_OUTPATIENT_CLINIC_OR_DEPARTMENT_OTHER): Payer: Self-pay | Admitting: Internal Medicine

## 2020-08-17 ENCOUNTER — Ambulatory Visit: Payer: HMO | Attending: Internal Medicine

## 2020-08-17 DIAGNOSIS — Z23 Encounter for immunization: Secondary | ICD-10-CM

## 2020-08-17 MED FILL — PFIZER-BIONTECH COVID-19 VA: 30 | 1 days supply | Qty: 0 | Fill #0

## 2020-08-17 NOTE — Progress Notes (Signed)
   Covid-19 Vaccination Clinic  Name:  KOLTON KIENLE    MRN: [id number] DOB: 12-01-1945  08/17/2020  Mr. Ferreras was observed post Covid-19 immunization for 15 minutes without incident. He was provided with Vaccine Information Sheet and instruction to access the V-Safe system.   Mr. Covalt was instructed to call 911 with any severe reactions post vaccine: [name] Difficulty breathing  . Swelling of face and throat  . A fast heartbeat  . A bad rash all over body  . Dizziness and weakness   Immunizations Administered    Name Date Dose VIS Date Route   Pfizer COVID-19 Vaccine 08/17/2020 11:19 AM 0.3 mL 07/08/2020 Intramuscular   Manufacturer: [place]   Lot: [id number]   [place]: [address]-2

## 2020-08-22 DIAGNOSIS — G4733 Obstructive sleep apnea (adult) (pediatric): Secondary | ICD-10-CM | POA: Diagnosis not present

## 2020-08-23 NOTE — Procedures (Signed)
    Patient Name: Alex Mccarty, Alex Mccarty Date: 08/08/2020 Gender: Male D.O.B: [birth year] Age (years): 74 Referring Provider: Annetta Maw Height (inches): 19 Interpreting Physician: Baird Lyons MD, ABSM Weight (lbs): 178 RPSGT: Jorge Ny BMI: 26 MRN: [id number] Neck Size: 15.50  CLINICAL INFORMATION Sleep Study Type: NPSG Indication for sleep study: Daytime Fatigue, Diabetes, Hypertension, Morning Headaches, Nocturnal Gasping, Non-refreshing Sleep, Obesity, Re-Evaluation, Snoring [place] Sleepiness Score: 13  SLEEP STUDY TECHNIQUE As per the AASM Manual for the Scoring of Sleep and Associated Events v2.3 (April 2016) with a hypopnea requiring 4% desaturations.  The channels recorded and monitored were frontal, central and occipital EEG, electrooculogram (EOG), submentalis EMG (chin), nasal and oral airflow, thoracic and abdominal wall motion, anterior tibialis EMG, snore microphone, electrocardiogram, and pulse oximetry.  MEDICATIONS Medications self-administered by patient taken the night of the study : none reported  SLEEP ARCHITECTURE The study was initiated at 11:01:10 PM and ended at 5:01:29 AM.  Sleep onset time was 16.9 minutes and the sleep efficiency was 82.1%%. The total sleep time was 295.9 minutes.  Stage REM latency was 44.5 minutes.  The patient spent 4.1%% of the night in stage N1 sleep, 63.3%% in stage N2 sleep, 0.0%% in stage N3 and 32.6% in REM.  Alpha intrusion was absent.  Supine sleep was 29.40%.  RESPIRATORY PARAMETERS The overall apnea/hypopnea index (AHI) was 1.8 per hour. There were 0 total apneas, including 0 obstructive, 0 central and 0 mixed apneas. There were 9 hypopneas and 5 RERAs.  The AHI during Stage REM sleep was 4.4 per hour.  AHI while supine was 5.5 per hour.  The mean oxygen saturation was 93.5%. The minimum SpO2 during sleep was 86.0%.  soft snoring was noted during this study.  CARDIAC DATA The 2 lead EKG  demonstrated sinus rhythm. The mean heart rate was 65.0 beats per minute. Other EKG findings include: None  LEG MOVEMENT DATA The total PLMS were 0 with a resulting PLMS index of 0.0. Associated arousal with leg movement index was 2.0 .  IMPRESSIONS - No significant obstructive sleep apnea occurred during this study (AHI = 1.8/h). - No significant central sleep apnea occurred during this study (CAI = 0.0/h). - Mild oxygen desaturation was noted during this study (Min O2 = 86.0%). Mean O2 sat 93.4%. Time with O2 sat 88% or less was 2 minutes. - The patient snored with soft snoring volume. - No cardiac abnormalities were noted during this study- occasional PACs.. - Occasional limb movements. No significant associated arousals. - Patient woke after a REM interval for about 30 minutes at 3:30 AM. Otherwise sleep was well- maintained.   DIAGNOSIS - Normal  RECOMMENDATIONS - Sleep hygiene should be reviewed to assess factors that may improve sleep quality. - Weight management and regular exercise should be initiated or continued if appropriate.  [Electronically signed] 08/23/2020 11:40 AM  Baird Lyons MD, ABSM Diplomate, American Board of Sleep Medicine   NPI: [id number]                        [place], [place] [facility]  ELECTRONICALLY SIGNED ON:  08/23/2020, 11:34 AM [place] PH: [phone]   FX: [phone] [place]

## 2020-09-17 ENCOUNTER — Encounter (INDEPENDENT_AMBULATORY_CARE_PROVIDER_SITE_OTHER): Payer: Self-pay | Admitting: Ophthalmology

## 2020-09-17 ENCOUNTER — Ambulatory Visit (INDEPENDENT_AMBULATORY_CARE_PROVIDER_SITE_OTHER): Payer: No Typology Code available for payment source | Admitting: Ophthalmology

## 2020-09-17 ENCOUNTER — Other Ambulatory Visit: Payer: Self-pay

## 2020-09-17 DIAGNOSIS — H43823 Vitreomacular adhesion, bilateral: Secondary | ICD-10-CM | POA: Diagnosis not present

## 2020-09-17 DIAGNOSIS — E113411 Type 2 diabetes mellitus with severe nonproliferative diabetic retinopathy with macular edema, right eye: Secondary | ICD-10-CM

## 2020-09-17 MED ORDER — BEVACIZUMAB 2.5 MG/0.1ML IZ SOSY
2.5000 mg | PREFILLED_SYRINGE | INTRAVITREAL | Status: AC | PRN
  Administered 2020-09-17: 2.5 mg via INTRAVITREAL

## 2020-09-17 NOTE — Assessment & Plan Note (Signed)
The nature of diabetic macular edema was discussed with the patient. Treatment options were outlined including medical therapy, laser & vitrectomy. The use of injectable medications reviewed, including Avastin, Lucentis, and Eylea. Periodic injections into the eye are likely to resolve diabetic macular edema (swelling in the center of vision). Initially, injections are delivered are delivered every 4-6 weeks, and the interval extended as the condition improves. On average, 8-9 injections the first year, and 5 in year 2. Improvement in the condition most often improves on medical therapy. Occasional use of focal laser is also recommended for residual macular edema (swelling). Excellent control of blood glucose and blood pressure are encouraged under the care of a primary physician or endocrinologist. Similarly, attempts to maintain serum cholesterol, low density lipoproteins, and high-density lipoproteins in a favorable range were recommended.   CSME OD improved post Avastin No. 1 thus active CSME noted.  We will repeat today at 6-week interval examination again in 8 weeks OD

## 2020-09-17 NOTE — Progress Notes (Signed)
09/17/2020     CHIEF COMPLAINT Patient presents for Retina Follow Up (6 WK FU OS, POSS AVASTIN OD///Pt reports vision slightly improved OD, no new F/F OD, no pain or pressure OD. ////Last A1C: 6.8  05/2020////Last BS: 120 taken 2 weeks ago. )   HISTORY OF PRESENT ILLNESS: Alex Mccarty is a 74 y.o. male who presents to the clinic today for:   HPI    Retina Follow Up    Patient presents with  Diabetic Retinopathy.  In right eye.  This started 6 weeks ago.  Duration of 6 weeks.  Since onset it is stable. Additional comments: 6 WK FU OS, POSS AVASTIN OD   Pt reports vision slightly improved OD, no new F/F OD, no pain or pressure OD.     Last A1C: 6.8  05/2020    Last BS: 120 taken 2 weeks ago.        Last edited by Nichola Sizer D on 09/17/2020  1:33 PM. (History)      Referring physician: Elisabeth Cara, PA-C [address] [place],  [place] [address]  HISTORICAL INFORMATION:   Selected notes from the MEDICAL RECORD NUMBER    Lab Results  Component Value Date   HGBA1C 7.0 (H) 09/28/2017     CURRENT MEDICATIONS: No current outpatient medications on file. (Ophthalmic Drugs)   No current facility-administered medications for this visit. (Ophthalmic Drugs)   Current Outpatient Medications (Other)  Medication Sig  . ASPIRIN LOW DOSE 81 MG EC tablet Take 81 mg by mouth at bedtime.   [name] atorvastatin (LIPITOR) 80 MG tablet Take 80 mg by mouth at bedtime.   . cetirizine (ZYRTEC) 10 MG tablet Take 10 mg by mouth daily as needed.   . Cholecalciferol (VITAMIN D3) 50 MCG (2000 UT) TABS Take 1 tablet by mouth daily.  . clopidogrel (PLAVIX) 75 MG tablet Take 75 mg by mouth every morning.  . cyclobenzaprine (FLEXERIL) 10 MG tablet Take 10 mg by mouth as needed (muscle pain).   . ferrous sulfate 325 (65 FE) MG tablet Take 325 mg by mouth as directed. On Monday, Wednesday and Friday at bedtime  . gabapentin (NEURONTIN) 300 MG capsule Take 300 mg by  mouth at bedtime.  . Glucosamine-Chondroitin (COSAMIN DS PO) Take 1 capsule by mouth at bedtime.  [name] LANTUS SOLOSTAR 100 UNIT/ML Solostar Pen Inject 45 Units into the skin every morning.   [name] losartan (COZAAR) 100 MG tablet Take 100 mg by mouth in the morning.   [name] omeprazole (PRILOSEC) 40 MG capsule Take 40 mg by mouth every morning.  . Polyethylene Glycol 3350 (MIRALAX PO) Take by mouth at bedtime.  . potassium citrate (UROCIT-K) 5 MEQ (540 MG) SR tablet Take 15 mEq by mouth every morning.  . propranolol (INDERAL) 20 MG tablet Take 40-60 mg by mouth every morning.  [name] tiZANidine (ZANAFLEX) 4 MG tablet Take 4 mg by mouth every 6 (six) hours as needed for muscle spasms.   No current facility-administered medications for this visit. (Other)      REVIEW OF SYSTEMS:    ALLERGIES Allergies  Allergen Reactions  . Amlodipine Besylate Other (See Comments)    HIVES  . Codeine Nausea And Vomiting  . Penicillins Other (See Comments)    Childhood reaction Has patient had a PCN reaction causing immediate rash, facial/tongue/throat swelling, SOB or lightheadedness with hypotension: Unknown Has patient had a PCN reaction causing severe rash involving mucus membranes or skin necrosis: Unknown Has  patient had a PCN reaction that required hospitalization: Unknown Has patient had a PCN reaction occurring within the last 10 years: Unknown If all of the above answers are "NO", then may proceed with Cephalosporin use.    . Sulfa Antibiotics Nausea And Vomiting    PAST MEDICAL HISTORY Past Medical History:  Diagnosis Date  . Arthritis   . Bowel obstruction ([place])   . CKD (chronic kidney disease)    unkknown stage per patient  . Diabetes mellitus ([place])   . Hyperlipidemia   . Hypertension   . Kidney stones    Past Surgical History:  Procedure Laterality Date  . COLONOSCOPY  10/2015   Dr Kenton Kingfisher  . COLOSTOMY REVISION Right 09/29/2017   Procedure: COLON RESECTION RIGHT;  Surgeon: Jovita Kussmaul, MD;  Location: [facility];  Service: General;  Laterality: Right;  . ELBOW SURGERY     ulnar nerve repair  . ESOPHAGOGASTRODUODENOSCOPY     same time   . FOOT SURGERY     crushed heel  . LAPAROTOMY N/A 09/29/2017   Procedure: EXPLORATORY LAPAROTOMY;  Surgeon: Jovita Kussmaul, MD;  Location: [facility];  Service: General;  Laterality: N/A;    FAMILY HISTORY Family History  Problem Relation Age of Onset  . Kidney Stones Mother   . Heart disease Mother   . Colon cancer Neg Hx   . Esophageal cancer Neg Hx     SOCIAL HISTORY Social History   Tobacco Use  . Smoking status: Former Research scientist (life sciences)  . Smokeless tobacco: Never Used  . Tobacco comment: quit over 40 years ago.Around age [age]   Vaping Use  . Vaping Use: Never used  Substance Use Topics  . Alcohol use: Yes    Alcohol/week: 0.0 standard drinks    Comment: occasional  . Drug use: No         OPHTHALMIC EXAM: Base Eye Exam    Visual Acuity (ETDRS)      Right Left   Dist cc 20/30 -2 20/20   Dist ph cc NI    Correction: Glasses       Tonometry (Tonopen, 1:38 PM)      Right Left   Pressure 12 14       Pupils      Pupils   Right PERRL   Left PERRL       Visual Fields (Counting fingers)      Left Right    Full Full       Extraocular Movement      Right Left    Full Full       Neuro/Psych    Oriented x3: Yes   Mood/Affect: Normal       Dilation    Both eyes: 1.0% Mydriacyl, 2.5% Phenylephrine @ 1:39 PM        Slit Lamp and Fundus Exam    External Exam      Right Left   External Normal Normal       Slit Lamp Exam      Right Left   Lids/Lashes Normal Normal   Conjunctiva/Sclera White and quiet White and quiet   Cornea Clear Clear   Anterior Chamber Deep and quiet Deep and quiet   Iris Round and reactive Round and reactive   Lens Centered posterior chamber intraocular lens Centered posterior chamber intraocular lens   Anterior Vitreous Normal Normal       Fundus Exam      Right Left    Posterior Vitreous Normal  Disc Normal    C/D Ratio 0.3    Macula Microaneurysms temporally , Mild clinically significant macular edema    Vessels NPDR- Moderate    Periphery Normal           IMAGING AND PROCEDURES  Imaging and Procedures for 09/17/20  OCT, Retina - OU - Both Eyes       Right Eye Quality was good. Scan locations included subfoveal. Central Foveal Thickness: 275. Progression has improved. Findings include vitreomacular adhesion .   Left Eye Quality was good. Scan locations included subfoveal. Central Foveal Thickness: 286. Progression has been stable.   Notes Much less CSME temporal and superotemporal OD status post Avastin No. 1.  We will repeat injection today and extend interval examination to 8 weeks.       Intravitreal Injection, Pharmacologic Agent - OD - Right Eye       Time Out 09/17/2020. 2:21 PM. Confirmed correct patient, procedure, site, and patient consented.   Anesthesia Topical anesthesia was used. Anesthetic medications included Akten 3.5%.   Procedure Preparation included Tobramycin 0.3%, 10% betadine to eyelids, 5% betadine to ocular surface. A 30 gauge needle was used.   Injection:  2.5 mg Bevacizumab (AVASTIN) 2.5mg /0.21mL SOSY   NDC: 50093-818-29, Lot: [id number]   Route: Intravitreal, Site: Right Eye  Post-op Post injection exam found visual acuity of at least counting fingers. The patient tolerated the procedure well. There were no complications. The patient received written and verbal post procedure care education. Post injection medications were not given.                 ASSESSMENT/PLAN:  Severe nonproliferative diabetic retinopathy of right eye, with macular edema, associated with type 2 diabetes mellitus ([place])  The nature of diabetic macular edema was discussed with the patient. Treatment options were outlined including medical therapy, laser & vitrectomy. The use of injectable medications reviewed, including  Avastin, Lucentis, and Eylea. Periodic injections into the eye are likely to resolve diabetic macular edema (swelling in the center of vision). Initially, injections are delivered are delivered every 4-6 weeks, and the interval extended as the condition improves. On average, 8-9 injections the first year, and 5 in year 2. Improvement in the condition most often improves on medical therapy. Occasional use of focal laser is also recommended for residual macular edema (swelling). Excellent control of blood glucose and blood pressure are encouraged under the care of a primary physician or endocrinologist. Similarly, attempts to maintain serum cholesterol, low density lipoproteins, and high-density lipoproteins in a favorable range were recommended.   CSME OD improved post Avastin No. 1 thus active CSME noted.  We will repeat today at 6-week interval examination again in 8 weeks OD  Vitreomacular adhesion of both eyes Minor OU no impact on acuity      ICD-10-CM   1. Severe nonproliferative diabetic retinopathy of right eye, with macular edema, associated with type 2 diabetes mellitus (HCC)  E11.3411 OCT, Retina - OU - Both Eyes    Intravitreal Injection, Pharmacologic Agent - OD - Right Eye    bevacizumab (AVASTIN) SOSY 2.5 mg  2. Vitreomacular adhesion of both eyes  H43.823     1.  Repeat intravitreal Avastin OD today at 6-week interval and extend interval examination next 8 weeks.  2.  If CSME stays involutional may soon add focal to small micro aneurysmal changes leading to this focal CSME extra foveal  3.  Ophthalmic Meds Ordered this visit:  Meds ordered this encounter  Medications  .  bevacizumab (AVASTIN) SOSY 2.5 mg       Return in about 8 weeks (around 11/12/2020) for dilate, OD, AVASTIN OCT.  There are no Patient Instructions on file for this visit.   Explained the diagnoses, plan, and follow up with the patient and they expressed understanding.  Patient expressed understanding  of the importance of proper follow up care.   Clent Demark Wandy Bossler M.D. Diseases & Surgery of the Retina and Vitreous Retina & Diabetic [place] 09/17/20     Abbreviations: M myopia (nearsighted); A astigmatism; H hyperopia (farsighted); P presbyopia; Mrx spectacle prescription;  CTL contact lenses; OD right eye; OS left eye; OU both eyes  XT exotropia; ET esotropia; PEK punctate epithelial keratitis; PEE punctate epithelial erosions; DES dry eye syndrome; MGD meibomian gland dysfunction; ATs artificial tears; PFAT's preservative free artificial tears; [place] nuclear sclerotic cataract; PSC posterior subcapsular cataract; ERM epi-retinal membrane; PVD posterior vitreous detachment; RD retinal detachment; DM diabetes mellitus; DR diabetic retinopathy; NPDR non-proliferative diabetic retinopathy; PDR proliferative diabetic retinopathy; CSME clinically significant macular edema; DME diabetic macular edema; dbh dot blot hemorrhages; CWS cotton wool spot; POAG primary open angle glaucoma; C/D cup-to-disc ratio; HVF humphrey visual field; GVF goldmann visual field; OCT optical coherence tomography; IOP intraocular pressure; BRVO Branch retinal vein occlusion; CRVO central retinal vein occlusion; CRAO central retinal artery occlusion; BRAO branch retinal artery occlusion; RT retinal tear; SB scleral buckle; PPV pars plana vitrectomy; VH Vitreous hemorrhage; PRP panretinal laser photocoagulation; IVK intravitreal kenalog; VMT vitreomacular traction; MH Macular hole;  NVD neovascularization of the disc; NVE neovascularization elsewhere; AREDS age related eye disease study; ARMD age related macular degeneration; POAG primary open angle glaucoma; EBMD epithelial/anterior basement membrane dystrophy; ACIOL anterior chamber intraocular lens; IOL intraocular lens; PCIOL posterior chamber intraocular lens; Phaco/IOL phacoemulsification with intraocular lens placement; [place] photorefractive keratectomy; LASIK laser assisted in  situ keratomileusis; HTN hypertension; DM diabetes mellitus; COPD chronic obstructive pulmonary disease

## 2020-09-17 NOTE — Assessment & Plan Note (Signed)
Minor OU no impact on acuity

## 2020-09-24 DIAGNOSIS — E1122 Type 2 diabetes mellitus with diabetic chronic kidney disease: Secondary | ICD-10-CM | POA: Diagnosis not present

## 2020-09-24 DIAGNOSIS — Z794 Long term (current) use of insulin: Secondary | ICD-10-CM | POA: Diagnosis not present

## 2020-09-24 DIAGNOSIS — I129 Hypertensive chronic kidney disease with stage 1 through stage 4 chronic kidney disease, or unspecified chronic kidney disease: Secondary | ICD-10-CM | POA: Diagnosis not present

## 2020-09-24 DIAGNOSIS — K219 Gastro-esophageal reflux disease without esophagitis: Secondary | ICD-10-CM | POA: Diagnosis not present

## 2020-09-24 DIAGNOSIS — N183 Chronic kidney disease, stage 3 unspecified: Secondary | ICD-10-CM | POA: Diagnosis not present

## 2020-09-25 ENCOUNTER — Other Ambulatory Visit: Payer: Self-pay

## 2020-10-27 DIAGNOSIS — M25512 Pain in left shoulder: Secondary | ICD-10-CM | POA: Diagnosis not present

## 2020-11-02 DIAGNOSIS — N183 Chronic kidney disease, stage 3 unspecified: Secondary | ICD-10-CM | POA: Diagnosis not present

## 2020-11-02 DIAGNOSIS — Z01818 Encounter for other preprocedural examination: Secondary | ICD-10-CM | POA: Diagnosis not present

## 2020-11-02 DIAGNOSIS — I129 Hypertensive chronic kidney disease with stage 1 through stage 4 chronic kidney disease, or unspecified chronic kidney disease: Secondary | ICD-10-CM | POA: Diagnosis not present

## 2020-11-02 DIAGNOSIS — Z794 Long term (current) use of insulin: Secondary | ICD-10-CM | POA: Diagnosis not present

## 2020-11-02 DIAGNOSIS — E1122 Type 2 diabetes mellitus with diabetic chronic kidney disease: Secondary | ICD-10-CM | POA: Diagnosis not present

## 2020-11-10 ENCOUNTER — Encounter (INDEPENDENT_AMBULATORY_CARE_PROVIDER_SITE_OTHER): Payer: HMO | Admitting: Ophthalmology

## 2020-11-10 ENCOUNTER — Other Ambulatory Visit: Payer: Self-pay

## 2020-11-10 ENCOUNTER — Encounter (INDEPENDENT_AMBULATORY_CARE_PROVIDER_SITE_OTHER): Payer: Self-pay | Admitting: Ophthalmology

## 2020-11-10 ENCOUNTER — Ambulatory Visit (INDEPENDENT_AMBULATORY_CARE_PROVIDER_SITE_OTHER): Payer: HMO | Admitting: Ophthalmology

## 2020-11-10 DIAGNOSIS — H43823 Vitreomacular adhesion, bilateral: Secondary | ICD-10-CM | POA: Diagnosis not present

## 2020-11-10 DIAGNOSIS — E113411 Type 2 diabetes mellitus with severe nonproliferative diabetic retinopathy with macular edema, right eye: Secondary | ICD-10-CM

## 2020-11-10 DIAGNOSIS — E113412 Type 2 diabetes mellitus with severe nonproliferative diabetic retinopathy with macular edema, left eye: Secondary | ICD-10-CM

## 2020-11-10 MED ORDER — BEVACIZUMAB 2.5 MG/0.1ML IZ SOSY
2.5000 mg | PREFILLED_SYRINGE | INTRAVITREAL | Status: AC | PRN
  Administered 2020-11-10: 2.5 mg via INTRAVITREAL

## 2020-11-10 NOTE — Assessment & Plan Note (Signed)
OS with new minor CME, CSME yet with good acuity

## 2020-11-10 NOTE — Assessment & Plan Note (Signed)
Improved CSME OD overall, currently at 8-week follow-up.  We will continue to treat with Avastin today and examination henceforth

## 2020-11-10 NOTE — Assessment & Plan Note (Signed)
Currently OU with no pathology from this condition

## 2020-11-10 NOTE — Progress Notes (Signed)
11/10/2020     CHIEF COMPLAINT Patient presents for Retina Follow Up (8 Wk FU OD, POSS AVASTIN OD *pt also seeing Dr. Luvenia Starch at [facility], she was not in [place] list, as well as Dr. Perez-Optometrist*///Pt reports stable vision OD. Pt denies any new F/F, pain, or pressure OU. ///Last A1C: 6.8 around 08/2020                                  Last BS: 99, 3 days ago )   HISTORY OF PRESENT ILLNESS: Alex Mccarty is a 75 y.o. male who presents to the clinic today for:   HPI    Retina Follow Up    Patient presents with  Diabetic Retinopathy.  In right eye.  This started 8 weeks ago.  Duration of 8 weeks.  Since onset it is stable. Additional comments: 8 Wk FU OD, POSS AVASTIN OD *pt also seeing Dr. Luvenia Starch at [facility], she was not in [facility] list, as well as Dr. Perez-Optometrist*   Pt reports stable vision OD. Pt denies any new F/F, pain, or pressure OU.    Last A1C: 6.8 around 08/2020                                  Last BS: 99, 3 days ago        Last edited by Donne Hazel on 11/10/2020  8:55 AM. (History)      Referring physician: Elisabeth Cara, PA-C [address] [place],  [place] [address]  HISTORICAL INFORMATION:   Selected notes from the [place]    Lab Results  Component Value Date   HGBA1C 7.0 (H) 09/28/2017     CURRENT MEDICATIONS: No current outpatient medications on file. (Ophthalmic Drugs)   No current facility-administered medications for this visit. (Ophthalmic Drugs)   Current Outpatient Medications (Other)  Medication Sig  . ASPIRIN LOW DOSE 81 MG EC tablet Take 81 mg by mouth at bedtime.   [name] atorvastatin (LIPITOR) 80 MG tablet Take 80 mg by mouth at bedtime.   . cetirizine (ZYRTEC) 10 MG tablet Take 10 mg by mouth daily as needed.   . Cholecalciferol (VITAMIN D3) 50 MCG (2000 UT) TABS Take 1 tablet by mouth daily.  . clopidogrel (PLAVIX) 75 MG tablet Take 75 mg by mouth every  morning.  . cyclobenzaprine (FLEXERIL) 10 MG tablet Take 10 mg by mouth as needed (muscle pain).   . ferrous sulfate 325 (65 FE) MG tablet Take 325 mg by mouth as directed. On Monday, Wednesday and Friday at bedtime  . gabapentin (NEURONTIN) 300 MG capsule Take 300 mg by mouth at bedtime.  . Glucosamine-Chondroitin (COSAMIN DS PO) Take 1 capsule by mouth at bedtime.  [name] LANTUS SOLOSTAR 100 UNIT/ML Solostar Pen Inject 45 Units into the skin every morning.   [name] losartan (COZAAR) 100 MG tablet Take 100 mg by mouth in the morning.   [name] omeprazole (PRILOSEC) 40 MG capsule Take 40 mg by mouth every morning.  . Polyethylene Glycol 3350 (MIRALAX PO) Take by mouth at bedtime.  . potassium citrate (UROCIT-K) 5 MEQ (540 MG) SR tablet Take 15 mEq by mouth every morning.  . propranolol (INDERAL) 20 MG tablet Take 40-60 mg by mouth every morning.  [name] tiZANidine (ZANAFLEX) 4 MG tablet Take 4 mg by mouth  every 6 (six) hours as needed for muscle spasms.   No current facility-administered medications for this visit. (Other)      REVIEW OF SYSTEMS:    ALLERGIES Allergies  Allergen Reactions  . Amlodipine Besylate Other (See Comments)    HIVES  . Codeine Nausea And Vomiting  . Penicillins Other (See Comments)    Childhood reaction Has patient had a PCN reaction causing immediate rash, facial/tongue/throat swelling, SOB or lightheadedness with hypotension: Unknown Has patient had a PCN reaction causing severe rash involving mucus membranes or skin necrosis: Unknown Has patient had a PCN reaction that required hospitalization: Unknown Has patient had a PCN reaction occurring within the last 10 years: Unknown If all of the above answers are "NO", then may proceed with Cephalosporin use.    . Sulfa Antibiotics Nausea And Vomiting    PAST MEDICAL HISTORY Past Medical History:  Diagnosis Date  . Arthritis   . Bowel obstruction ([place])   . CKD (chronic kidney disease)    unkknown stage per patient  .  Diabetes mellitus ([place])   . Hyperlipidemia   . Hypertension   . Kidney stones    Past Surgical History:  Procedure Laterality Date  . COLONOSCOPY  10/2015   Dr Kenton Kingfisher  . COLOSTOMY REVISION Right 09/29/2017   Procedure: COLON RESECTION RIGHT;  Surgeon: Jovita Kussmaul, MD;  Location: [facility];  Service: General;  Laterality: Right;  . ELBOW SURGERY     ulnar nerve repair  . ESOPHAGOGASTRODUODENOSCOPY     same time   . FOOT SURGERY     crushed heel  . LAPAROTOMY N/A 09/29/2017   Procedure: EXPLORATORY LAPAROTOMY;  Surgeon: Jovita Kussmaul, MD;  Location: [facility];  Service: General;  Laterality: N/A;    FAMILY HISTORY Family History  Problem Relation Age of Onset  . Kidney Stones Mother   . Heart disease Mother   . Colon cancer Neg Hx   . Esophageal cancer Neg Hx     SOCIAL HISTORY Social History   Tobacco Use  . Smoking status: Former Research scientist (life sciences)  . Smokeless tobacco: Never Used  . Tobacco comment: quit over 40 years ago.Around age [age]   Vaping Use  . Vaping Use: Never used  Substance Use Topics  . Alcohol use: Yes    Alcohol/week: 0.0 standard drinks    Comment: occasional  . Drug use: No         OPHTHALMIC EXAM:  Base Eye Exam    Visual Acuity (ETDRS)      Right Left   Dist cc 20/30 -2 20/20 -1   Dist ph cc NI    Correction: Glasses       Tonometry (Tonopen, 8:52 AM)      Right Left   Pressure 15 17       Pupils      Pupils Dark Light Shape React APD   Right PERRL 3 2 Round Brisk None   Left PERRL 3 2 Round Brisk None       Visual Fields (Counting fingers)      Left Right    Full Full       Extraocular Movement      Right Left    Full Full       Neuro/Psych    Oriented x3: Yes   Mood/Affect: Normal        Slit Lamp and Fundus Exam    External Exam      Right Left   External Normal  Normal       Slit Lamp Exam      Right Left   Lids/Lashes Normal Normal   Conjunctiva/Sclera White and quiet White and quiet   Cornea Clear Clear    Anterior Chamber Deep and quiet Deep and quiet   Iris Round and reactive Round and reactive   Lens Centered posterior chamber intraocular lens Centered posterior chamber intraocular lens   Anterior Vitreous Normal Normal       Fundus Exam      Right Left   Posterior Vitreous Normal    Disc Normal    C/D Ratio 0.3    Macula Microaneurysms temporally , Mild clinically significant macular edema    Vessels NPDR-Severe    Periphery Normal           IMAGING AND PROCEDURES  Imaging and Procedures for 11/10/20  OCT, Retina - OU - Both Eyes       Right Eye Central Foveal Thickness: 269. Progression has improved. Findings include vitreomacular adhesion .   Left Eye Central Foveal Thickness: 294. Progression has worsened. Findings include cystoid macular edema, vitreomacular adhesion .   Notes OD with much less CSME, perifoveal as compared to onset and as compared to last visit, September 17, 2020.  Repeat injection intravitreal Avastin today, to maintain and evaluate again OU in 4 to 5 weeks to monitor left eye  OS with new minor CME, CSME yet with good acuity       Intravitreal Injection, Pharmacologic Agent - OD - Right Eye       Time Out 11/10/2020. 9:44 AM. Confirmed correct patient, procedure, site, and patient consented.   Anesthesia Topical anesthesia was used. Anesthetic medications included Akten 3.5%.   Procedure Preparation included Tobramycin 0.3%, 10% betadine to eyelids, 5% betadine to ocular surface. A 30 gauge needle was used.   Injection:  2.5 mg Bevacizumab (AVASTIN) 2.5mg /0.49mL SOSY   NDC: 96295-284-13, Lot: [id number]   Route: Intravitreal, Site: Right Eye  Post-op Post injection exam found visual acuity of at least counting fingers. The patient tolerated the procedure well. There were no complications. The patient received written and verbal post procedure care education. Post injection medications were not given.                  ASSESSMENT/PLAN:  Severe nonproliferative diabetic retinopathy of left eye, with macular edema, associated with type 2 diabetes mellitus (HCC) OS with new minor CME, CSME yet with good acuity  Severe nonproliferative diabetic retinopathy of right eye, with macular edema, associated with type 2 diabetes mellitus (HCC) Improved CSME OD overall, currently at 8-week follow-up.  We will continue to treat with Avastin today and examination henceforth  Vitreomacular adhesion of both eyes Currently OU with no pathology from this condition      ICD-10-CM   1. Severe nonproliferative diabetic retinopathy of right eye, with macular edema, associated with type 2 diabetes mellitus (HCC)  E11.3411 OCT, Retina - OU - Both Eyes    Intravitreal Injection, Pharmacologic Agent - OD - Right Eye    bevacizumab (AVASTIN) SOSY 2.5 mg  2. Severe nonproliferative diabetic retinopathy of left eye, with macular edema, associated with type 2 diabetes mellitus (HCC)  V25.3664 OCT, Retina - OU - Both Eyes  3. Vitreomacular adhesion of both eyes  H43.823     1.  Repeat injection intravitreal Avastin today as CSME has continued to diminish on therapy since November 2021.  2.  Dilate OU next in 4 to 5  weeks, as new onset CSME has developed left eye yet with good acuity may observe today  3.  Ophthalmic Meds Ordered this visit:  Meds ordered this encounter  Medications  . bevacizumab (AVASTIN) SOSY 2.5 mg       Return in about 5 weeks (around 12/15/2020) for DILATE OU, OPTOS FFA L/R, COLOR FP, OCT.  There are no Patient Instructions on file for this visit.   Explained the diagnoses, plan, and follow up with the patient and they expressed understanding.  Patient expressed understanding of the importance of proper follow up care.   Clent Demark Rankin M.D. Diseases & Surgery of the Retina and Vitreous Retina & Diabetic [place] 11/10/20     Abbreviations: M myopia (nearsighted); A astigmatism; H  hyperopia (farsighted); P presbyopia; Mrx spectacle prescription;  CTL contact lenses; OD right eye; OS left eye; OU both eyes  XT exotropia; ET esotropia; PEK punctate epithelial keratitis; PEE punctate epithelial erosions; DES dry eye syndrome; MGD meibomian gland dysfunction; ATs artificial tears; PFAT's preservative free artificial tears; [place] nuclear sclerotic cataract; PSC posterior subcapsular cataract; ERM epi-retinal membrane; PVD posterior vitreous detachment; RD retinal detachment; DM diabetes mellitus; DR diabetic retinopathy; NPDR non-proliferative diabetic retinopathy; PDR proliferative diabetic retinopathy; CSME clinically significant macular edema; DME diabetic macular edema; dbh dot blot hemorrhages; CWS cotton wool spot; POAG primary open angle glaucoma; C/D cup-to-disc ratio; HVF humphrey visual field; GVF goldmann visual field; OCT optical coherence tomography; IOP intraocular pressure; BRVO Branch retinal vein occlusion; CRVO central retinal vein occlusion; CRAO central retinal artery occlusion; BRAO branch retinal artery occlusion; RT retinal tear; SB scleral buckle; PPV pars plana vitrectomy; VH Vitreous hemorrhage; PRP panretinal laser photocoagulation; IVK intravitreal kenalog; VMT vitreomacular traction; MH Macular hole;  NVD neovascularization of the disc; NVE neovascularization elsewhere; AREDS age related eye disease study; ARMD age related macular degeneration; POAG primary open angle glaucoma; EBMD epithelial/anterior basement membrane dystrophy; ACIOL anterior chamber intraocular lens; IOL intraocular lens; PCIOL posterior chamber intraocular lens; Phaco/IOL phacoemulsification with intraocular lens placement; [place] photorefractive keratectomy; LASIK laser assisted in situ keratomileusis; HTN hypertension; DM diabetes mellitus; COPD chronic obstructive pulmonary disease

## 2020-11-12 ENCOUNTER — Encounter (INDEPENDENT_AMBULATORY_CARE_PROVIDER_SITE_OTHER): Payer: HMO | Admitting: Ophthalmology

## 2020-11-18 DIAGNOSIS — G8918 Other acute postprocedural pain: Secondary | ICD-10-CM | POA: Diagnosis not present

## 2020-11-18 DIAGNOSIS — M75102 Unspecified rotator cuff tear or rupture of left shoulder, not specified as traumatic: Secondary | ICD-10-CM | POA: Diagnosis not present

## 2020-12-09 ENCOUNTER — Telehealth: Payer: Self-pay

## 2020-12-09 NOTE — Telephone Encounter (Signed)
Called patient but he doesn't remember seeing [place]. Was trying to tell him that he saw Dr Lyndel Safe last year and needs an office appointment with Dr Lyndel Safe but he took our number and said that he will have his wife call us back.   Follow up appointment to schedule an MRCP is needed

## 2020-12-10 NOTE — Telephone Encounter (Signed)
Pt's wife [name] returned call. I mentioned the reason of your call. She stated that pt goes to the [place], last year he had an MRI at the [place] and GI from the [place] determined that pt did not need an MRCP. Mrs. Arana stated that she brought MRI results and images for Dr. Lyndel Safe to review and he agreed with the determination of GI MD at the [facility] that pt does not need MRCP.

## 2020-12-10 NOTE — Telephone Encounter (Signed)
Does this patient need to be seen the office because was sent after this last office visit?

## 2020-12-10 NOTE — Telephone Encounter (Signed)
-----   Message from Mohammed Kindle, RN sent at 12/16/2019 12:09 PM EDT ----- Regarding: repeat MRI w/MRCP Per [facility]=MRI abdomen without contrast 11/29/2019 Compared with MRI 02/28/2019  -B/L renal cysts.  Stable.  No further follow-up is required. -Duodenal abnormality in the prior study appearance favoring prominence of pylorus. 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  No changes.  Radiology recommends follow-up MRCP in 12 months.   RG  Bre, (TRISHA) FU MRCP in 12 months RG

## 2020-12-10 NOTE — Telephone Encounter (Signed)
Please get the results of MRCP from [place] Then, FU visit with me or APP clinic (routine) RG

## 2020-12-15 NOTE — Telephone Encounter (Signed)
Report form 12-06-2019 under Media tab labeled Personal information Report [facility] has MRI saying patient needs a 12 month follow. Patient needs to make an office appointment to discuss this further.   LVM for patient to call back to schedule an office visit

## 2020-12-15 NOTE — Telephone Encounter (Signed)
Patient had MRI 11-29-2019

## 2020-12-15 NOTE — Telephone Encounter (Signed)
Talked with wife told her that she can reach out to the [place] because they started the referral but I told her that on 3-12 2021 patient had a MRI done and it was recommended to do a MRCP in 12 months and im calling to see if they will have an appointment to discuss this so since she said it was on the [place] then I told her to ask the [place] about this them but gave her the date

## 2020-12-16 ENCOUNTER — Ambulatory Visit (INDEPENDENT_AMBULATORY_CARE_PROVIDER_SITE_OTHER): Payer: No Typology Code available for payment source | Admitting: Ophthalmology

## 2020-12-16 ENCOUNTER — Other Ambulatory Visit: Payer: Self-pay

## 2020-12-16 ENCOUNTER — Encounter (INDEPENDENT_AMBULATORY_CARE_PROVIDER_SITE_OTHER): Payer: Self-pay | Admitting: Ophthalmology

## 2020-12-16 DIAGNOSIS — E113411 Type 2 diabetes mellitus with severe nonproliferative diabetic retinopathy with macular edema, right eye: Secondary | ICD-10-CM | POA: Diagnosis not present

## 2020-12-16 DIAGNOSIS — E113412 Type 2 diabetes mellitus with severe nonproliferative diabetic retinopathy with macular edema, left eye: Secondary | ICD-10-CM | POA: Diagnosis not present

## 2020-12-16 MED ORDER — FLUORESCEIN SODIUM 10 % IV SOLN
500.0000 mg | INTRAVENOUS | Status: AC | PRN
  Administered 2020-12-16: 500 mg via INTRAVENOUS

## 2020-12-16 MED ORDER — BEVACIZUMAB 2.5 MG/0.1ML IZ SOSY
2.5000 mg | PREFILLED_SYRINGE | INTRAVITREAL | Status: AC | PRN
  Administered 2020-12-16: 2.5 mg via INTRAVITREAL

## 2020-12-16 MED ORDER — BEVACIZUMAB CHEMO INJECTION 1.25MG/0.05ML SYRINGE FOR KALEIDOSCOPE
1.2500 mg | INTRAVITREAL | Status: AC | PRN
  Administered 2020-12-16: 1.25 mg via INTRAVITREAL

## 2020-12-16 NOTE — Progress Notes (Signed)
12/16/2020     CHIEF COMPLAINT Patient presents for Retina Follow Up (5 Week NPDR f\u OU. Possible Avastin OD. OCT and FP. FFA L/R/Pt feels DVA has improved. Pt states NVA has decreased./BGL: did not check)   HISTORY OF PRESENT ILLNESS: Alex Mccarty is a 75 y.o. male who presents to the clinic today for:   HPI    Retina Follow Up    Patient presents with  Diabetic Retinopathy.  In right eye.  Severity is moderate.  Duration of 5 weeks.  Since onset it is stable.  I, the attending physician,  performed the HPI with the patient and updated documentation appropriately. Additional comments: 5 Week NPDR f\u OU. Possible Avastin OD. OCT and FP. FFA L/R Pt feels DVA has improved. Pt states NVA has decreased. BGL: did not check       Last edited by Tilda Franco on 12/16/2020  8:06 AM. (History)      Referring physician: Elisabeth Cara, PA-C [address] [place],  [place] [address]  HISTORICAL INFORMATION:   Selected notes from the MEDICAL RECORD NUMBER    Lab Results  Component Value Date   HGBA1C 7.0 (H) 09/28/2017     CURRENT MEDICATIONS: No current outpatient medications on file. (Ophthalmic Drugs)   No current facility-administered medications for this visit. (Ophthalmic Drugs)   Current Outpatient Medications (Other)  Medication Sig  . ASPIRIN LOW DOSE 81 MG EC tablet Take 81 mg by mouth at bedtime.   [name] atorvastatin (LIPITOR) 80 MG tablet Take 80 mg by mouth at bedtime.   . cetirizine (ZYRTEC) 10 MG tablet Take 10 mg by mouth daily as needed.   . Cholecalciferol (VITAMIN D3) 50 MCG (2000 UT) TABS Take 1 tablet by mouth daily.  . clopidogrel (PLAVIX) 75 MG tablet Take 75 mg by mouth every morning.  . cyclobenzaprine (FLEXERIL) 10 MG tablet Take 10 mg by mouth as needed (muscle pain).   . ferrous sulfate 325 (65 FE) MG tablet Take 325 mg by mouth as directed. On Monday, Wednesday and Friday at bedtime  . gabapentin (NEURONTIN) 300 MG capsule  Take 300 mg by mouth at bedtime.  . Glucosamine-Chondroitin (COSAMIN DS PO) Take 1 capsule by mouth at bedtime.  [name] LANTUS SOLOSTAR 100 UNIT/ML Solostar Pen Inject 45 Units into the skin every morning.   [name] losartan (COZAAR) 100 MG tablet Take 100 mg by mouth in the morning.   [name] omeprazole (PRILOSEC) 40 MG capsule Take 40 mg by mouth every morning.  . Polyethylene Glycol 3350 (MIRALAX PO) Take by mouth at bedtime.  . potassium citrate (UROCIT-K) 5 MEQ (540 MG) SR tablet Take 15 mEq by mouth every morning.  . propranolol (INDERAL) 20 MG tablet Take 40-60 mg by mouth every morning.  [name] tiZANidine (ZANAFLEX) 4 MG tablet Take 4 mg by mouth every 6 (six) hours as needed for muscle spasms.   No current facility-administered medications for this visit. (Other)      REVIEW OF SYSTEMS: ROS    Positive for: Endocrine   Last edited by Tilda Franco on 12/16/2020  8:06 AM. (History)       ALLERGIES Allergies  Allergen Reactions  . Amlodipine Besylate Other (See Comments)    HIVES  . Codeine Nausea And Vomiting  . Penicillins Other (See Comments)    Childhood reaction Has patient had a PCN reaction causing immediate rash, facial/tongue/throat swelling, SOB or lightheadedness with hypotension: Unknown Has patient had a  PCN reaction causing severe rash involving mucus membranes or skin necrosis: Unknown Has patient had a PCN reaction that required hospitalization: Unknown Has patient had a PCN reaction occurring within the last 10 years: Unknown If all of the above answers are "NO", then may proceed with Cephalosporin use.    . Sulfa Antibiotics Nausea And Vomiting    PAST MEDICAL HISTORY Past Medical History:  Diagnosis Date  . Arthritis   . Bowel obstruction ([place])   . CKD (chronic kidney disease)    unkknown stage per patient  . Diabetes mellitus ([place])   . Hyperlipidemia   . Hypertension   . Kidney stones    Past Surgical History:  Procedure Laterality Date  . COLONOSCOPY   10/2015   Dr Kenton Kingfisher  . COLOSTOMY REVISION Right 09/29/2017   Procedure: COLON RESECTION RIGHT;  Surgeon: Jovita Kussmaul, MD;  Location: [facility];  Service: General;  Laterality: Right;  . ELBOW SURGERY     ulnar nerve repair  . ESOPHAGOGASTRODUODENOSCOPY     same time   . FOOT SURGERY     crushed heel  . LAPAROTOMY N/A 09/29/2017   Procedure: EXPLORATORY LAPAROTOMY;  Surgeon: Jovita Kussmaul, MD;  Location: [facility];  Service: General;  Laterality: N/A;    FAMILY HISTORY Family History  Problem Relation Age of Onset  . Kidney Stones Mother   . Heart disease Mother   . Colon cancer Neg Hx   . Esophageal cancer Neg Hx     SOCIAL HISTORY Social History   Tobacco Use  . Smoking status: Former Research scientist (life sciences)  . Smokeless tobacco: Never Used  . Tobacco comment: quit over 40 years ago.Around age [age]   Vaping Use  . Vaping Use: Never used  Substance Use Topics  . Alcohol use: Yes    Alcohol/week: 0.0 standard drinks    Comment: occasional  . Drug use: No         OPHTHALMIC EXAM:  Base Eye Exam    Visual Acuity (Snellen - Linear)      Right Left   Dist [place] 20/60 -2 20/40 -1   Dist ph [place] 20/30 -1 20/25 -1       Tonometry (Tonopen, 8:11 AM)      Right Left   Pressure 13 10       Pupils      Pupils Dark Light Shape React APD   Right PERRL 3 3 Round Minimal None   Left PERRL 3 3 Round Minimal None       Neuro/Psych    Oriented x3: Yes   Mood/Affect: Normal       Dilation    Both eyes: 1.0% Mydriacyl, 2.5% Phenylephrine @ 8:11 AM        Slit Lamp and Fundus Exam    External Exam      Right Left   External Normal Normal       Slit Lamp Exam      Right Left   Lids/Lashes Normal Normal   Conjunctiva/Sclera White and quiet White and quiet   Cornea Clear Clear   Anterior Chamber Deep and quiet Deep and quiet   Iris Round and reactive Round and reactive   Lens Centered posterior chamber intraocular lens Centered posterior chamber intraocular lens   Anterior  Vitreous Normal Normal       Fundus Exam      Right Left   Posterior Vitreous Normal Normal   Disc Normal Normal   C/D Ratio 0.3 0.2  Macula Microaneurysms temporally , Mild clinically significant macular edema Microaneurysms, Mild clinically significant macular edema. temporally   Vessels NPDR-Severe NPDR- Moderate   Periphery Normal Normal          IMAGING AND PROCEDURES  Imaging and Procedures for 12/16/20  OCT, Retina - OU - Both Eyes       Right Eye Central Foveal Thickness: 271. Progression has improved. Findings include vitreomacular adhesion .   Left Eye Central Foveal Thickness: 280. Progression has worsened. Findings include cystoid macular edema, vitreomacular adhesion .   Notes OD with much less CSME, perifoveal as compared to onset and as compared to last visit, September 17, 2020.  Repeat injection intravitreal Avastin today, to maintain and evaluate again OU in 4 to 5 weeks to monitor left eye  OS with new minor CME, CSME yet with good acuity       Color Fundus Photography Optos - OU - Both Eyes       Right Eye Disc findings include normal observations. Macula : microaneurysms, edema.   Left Eye Progression has no prior data. Disc findings include normal observations. Macula : microaneurysms, edema.   Notes Severe nonproliferative diabetic retinopathy, CSME OU       Fluorescein Angiography Optos (Transit OS)       Injection:  500 mg Fluorescein Sodium 10 % injection   NDC: [phone]   Route: Intravenous, Site: Right ArmRight Eye   Progression has no prior data. Mid/Late phase findings include leakage. Choroidal neovascularization is not present.   Left Eye   Progression has no prior data. Early phase findings include microaneurysm. Mid/Late phase findings include leakage. Choroidal neovascularization is not present.   Notes CSME OS from focal leakages inferotemporal.  Once controlled, focal laser could be delivered in this  region to decrease treatment burden on the left eye  OD with CSME improved overall with perifoveal leaking spots.  Not amenable to focal laser treatment, will repeat intravitreal Avastin OD today and extend interval examination the right eye       Intravitreal Injection, Pharmacologic Agent - OD - Right Eye       Time Out 12/16/2020. 9:00 AM. Confirmed correct patient, procedure, site, and patient consented.   Anesthesia Topical anesthesia was used. Anesthetic medications included [place] 3.5%.   Procedure Preparation included Tobramycin 0.3%, 10% betadine to eyelids, 5% betadine to ocular surface. A 30 gauge needle was used.   Injection:  2.5 mg Bevacizumab (AVASTIN) 2.5mg /0.55mL SOSY   NDC: 27741-287-86   Route: Intravitreal, Site: Right Eye 1.25 mg Bevacizumab (AVASTIN) 1.25mg /0.29mL SOLN   NDC: 50242-060-01   Route: Intravitreal, Site: Right Eye, Waste: 0 mg  Post-op Post injection exam found visual acuity of at least counting fingers. The patient tolerated the procedure well. There were no complications. The patient received written and verbal post procedure care education. Post injection medications were not given.                 ASSESSMENT/PLAN:  Severe nonproliferative diabetic retinopathy of left eye, with macular edema, associated with type 2 diabetes mellitus (HCC) Documented CSME OS with focal leakages inferotemporal to the fovea left eye, will likely be able to control with injection intravitreal Avastin 1 injection followed thereafter by focal laser treatment  Severe nonproliferative diabetic retinopathy of right eye, with macular edema, associated with type 2 diabetes mellitus ([place])  The nature of diabetic macular edema was discussed with the patient. Treatment options were outlined including medical therapy, laser & vitrectomy. The use  of injectable medications reviewed, including Avastin, Lucentis, and Eylea. Periodic injections into the eye are likely to  resolve diabetic macular edema (swelling in the center of vision). Initially, injections are delivered are delivered every 4-6 weeks, and the interval extended as the condition improves. On average, 8-9 injections the first year, and 5 in year 2. Improvement in the condition most often improves on medical therapy. Occasional use of focal laser is also recommended for residual macular edema (swelling). Excellent control of blood glucose and blood pressure are encouraged under the care of a primary physician or endocrinologist. Similarly, attempts to maintain serum cholesterol, low density lipoproteins, and high-density lipoproteins in a favorable range were recommended.   OD with improved CSME with perifoveal leakage is not amenable to focal treatment.  We will repeat injection Avastin today and follow-up right eye again in 7 to 8 weeks      ICD-10-CM   1. Severe nonproliferative diabetic retinopathy of right eye, with macular edema, associated with type 2 diabetes mellitus (HCC)  E11.3411 OCT, Retina - OU - Both Eyes    Color Fundus Photography Optos - OU - Both Eyes    Fluorescein Angiography Optos (Transit OS)    Fluorescein Sodium 10 % injection 500 mg    Intravitreal Injection, Pharmacologic Agent - OD - Right Eye    bevacizumab (AVASTIN) SOSY 2.5 mg    Bevacizumab (AVASTIN) SOLN 1.25 mg  2. Severe nonproliferative diabetic retinopathy of left eye, with macular edema, associated with type 2 diabetes mellitus (HCC)  D98.3382 OCT, Retina - OU - Both Eyes    Color Fundus Photography Optos - OU - Both Eyes    Fluorescein Angiography Optos (Transit OS)    Fluorescein Sodium 10 % injection 500 mg    1.  We will repeat injection intravitreal Avastin OD today, done. Follow-up OD next in 8 weeks  2.  We will plan injection Avastin left eye in the coming weeks to control of CSME followed thereafter some weeks later by focal laser treatment and simple follow-up on the left eye  3.  Ophthalmic Meds  Ordered this visit:  Meds ordered this encounter  Medications  . Fluorescein Sodium 10 % injection 500 mg  . bevacizumab (AVASTIN) SOSY 2.5 mg  . Bevacizumab (AVASTIN) SOLN 1.25 mg       Return in about 2 weeks (around 12/30/2020) for dilate, OS, AVASTIN OCT.  There are no Patient Instructions on file for this visit.   Explained the diagnoses, plan, and follow up with the patient and they expressed understanding.  Patient expressed understanding of the importance of proper follow up care.   Clent Demark Tyquez Hollibaugh M.D. Diseases & Surgery of the Retina and Vitreous Retina & Diabetic [place] 12/16/20     Abbreviations: M myopia (nearsighted); A astigmatism; H hyperopia (farsighted); P presbyopia; Mrx spectacle prescription;  CTL contact lenses; OD right eye; OS left eye; OU both eyes  XT exotropia; ET esotropia; PEK punctate epithelial keratitis; PEE punctate epithelial erosions; DES dry eye syndrome; MGD meibomian gland dysfunction; ATs artificial tears; PFAT's preservative free artificial tears; [place] nuclear sclerotic cataract; PSC posterior subcapsular cataract; ERM epi-retinal membrane; PVD posterior vitreous detachment; RD retinal detachment; DM diabetes mellitus; DR diabetic retinopathy; NPDR non-proliferative diabetic retinopathy; PDR proliferative diabetic retinopathy; CSME clinically significant macular edema; DME diabetic macular edema; dbh dot blot hemorrhages; CWS cotton wool spot; POAG primary open angle glaucoma; C/D cup-to-disc ratio; HVF humphrey visual field; GVF goldmann visual field; OCT optical coherence tomography; IOP  intraocular pressure; BRVO Branch retinal vein occlusion; CRVO central retinal vein occlusion; CRAO central retinal artery occlusion; BRAO branch retinal artery occlusion; RT retinal tear; SB scleral buckle; PPV pars plana vitrectomy; VH Vitreous hemorrhage; PRP panretinal laser photocoagulation; IVK intravitreal kenalog; VMT vitreomacular traction; MH Macular  hole;  NVD neovascularization of the disc; NVE neovascularization elsewhere; AREDS age related eye disease study; ARMD age related macular degeneration; POAG primary open angle glaucoma; EBMD epithelial/anterior basement membrane dystrophy; ACIOL anterior chamber intraocular lens; IOL intraocular lens; PCIOL posterior chamber intraocular lens; Phaco/IOL phacoemulsification with intraocular lens placement; [place] photorefractive keratectomy; LASIK laser assisted in situ keratomileusis; HTN hypertension; DM diabetes mellitus; COPD chronic obstructive pulmonary disease

## 2020-12-16 NOTE — Assessment & Plan Note (Signed)
Documented CSME OS with focal leakages inferotemporal to the fovea left eye, will likely be able to control with injection intravitreal Avastin 1 injection followed thereafter by focal laser treatment

## 2020-12-16 NOTE — Assessment & Plan Note (Signed)
The nature of diabetic macular edema was discussed with the patient. Treatment options were outlined including medical therapy, laser & vitrectomy. The use of injectable medications reviewed, including Avastin, Lucentis, and Eylea. Periodic injections into the eye are likely to resolve diabetic macular edema (swelling in the center of vision). Initially, injections are delivered are delivered every 4-6 weeks, and the interval extended as the condition improves. On average, 8-9 injections the first year, and 5 in year 2. Improvement in the condition most often improves on medical therapy. Occasional use of focal laser is also recommended for residual macular edema (swelling). Excellent control of blood glucose and blood pressure are encouraged under the care of a primary physician or endocrinologist. Similarly, attempts to maintain serum cholesterol, low density lipoproteins, and high-density lipoproteins in a favorable range were recommended.   OD with improved CSME with perifoveal leakage is not amenable to focal treatment.  We will repeat injection Avastin today and follow-up right eye again in 7 to 8 weeks

## 2020-12-23 DIAGNOSIS — N183 Chronic kidney disease, stage 3 unspecified: Secondary | ICD-10-CM | POA: Diagnosis not present

## 2020-12-23 DIAGNOSIS — M75102 Unspecified rotator cuff tear or rupture of left shoulder, not specified as traumatic: Secondary | ICD-10-CM | POA: Diagnosis not present

## 2020-12-23 DIAGNOSIS — E1122 Type 2 diabetes mellitus with diabetic chronic kidney disease: Secondary | ICD-10-CM | POA: Diagnosis not present

## 2020-12-23 DIAGNOSIS — Z794 Long term (current) use of insulin: Secondary | ICD-10-CM | POA: Diagnosis not present

## 2020-12-23 DIAGNOSIS — I129 Hypertensive chronic kidney disease with stage 1 through stage 4 chronic kidney disease, or unspecified chronic kidney disease: Secondary | ICD-10-CM | POA: Diagnosis not present

## 2020-12-29 ENCOUNTER — Encounter (INDEPENDENT_AMBULATORY_CARE_PROVIDER_SITE_OTHER): Payer: HMO | Admitting: Ophthalmology

## 2020-12-30 ENCOUNTER — Ambulatory Visit (INDEPENDENT_AMBULATORY_CARE_PROVIDER_SITE_OTHER): Payer: No Typology Code available for payment source | Admitting: Ophthalmology

## 2020-12-30 ENCOUNTER — Other Ambulatory Visit: Payer: Self-pay

## 2020-12-30 ENCOUNTER — Encounter (INDEPENDENT_AMBULATORY_CARE_PROVIDER_SITE_OTHER): Payer: HMO | Admitting: Ophthalmology

## 2020-12-30 ENCOUNTER — Encounter (INDEPENDENT_AMBULATORY_CARE_PROVIDER_SITE_OTHER): Payer: Self-pay | Admitting: Ophthalmology

## 2020-12-30 DIAGNOSIS — E113412 Type 2 diabetes mellitus with severe nonproliferative diabetic retinopathy with macular edema, left eye: Secondary | ICD-10-CM

## 2020-12-30 DIAGNOSIS — E113411 Type 2 diabetes mellitus with severe nonproliferative diabetic retinopathy with macular edema, right eye: Secondary | ICD-10-CM | POA: Diagnosis not present

## 2020-12-30 MED ORDER — BEVACIZUMAB 2.5 MG/0.1ML IZ SOSY
2.5000 mg | PREFILLED_SYRINGE | INTRAVITREAL | Status: AC | PRN
  Administered 2020-12-30: 2.5 mg via INTRAVITREAL

## 2020-12-30 NOTE — Assessment & Plan Note (Signed)
CSME component much improved as compared to onset.  We will commence therapy in the left eye to stabilize severe NPDR but also with the multifocal CSME adjacent to the macula and seen on OCT and FFA dated 12-16-2020

## 2020-12-30 NOTE — Progress Notes (Signed)
12/30/2020     CHIEF COMPLAINT Patient presents for Retina Follow Up (2wk fu OS. Possible Avastin OS//Pt states VA OU stable since last visit. Pt denies FOL, floaters, or ocular pain OU. / LBS: 110 (few days ago))   HISTORY OF PRESENT ILLNESS: Alex Mccarty is a 75 y.o. male who presents to the clinic today for:   HPI    Retina Follow Up    Patient presents with  Diabetic Retinopathy.  In left eye.  This started 2 weeks ago.  Duration of 2 weeks.  Since onset it is stable. Additional comments: 2wk fu OS. Possible Avastin OS  Pt states VA OU stable since last visit. Pt denies FOL, floaters, or ocular pain OU.   LBS: 110 (few days ago)       Last edited by Kendra Opitz, COA on 12/30/2020  8:19 AM. (History)      Referring physician: Elisabeth Cara, PA-C [address] [place],  [place] [address]  HISTORICAL INFORMATION:   Selected notes from the MEDICAL RECORD NUMBER    Lab Results  Component Value Date   HGBA1C 7.0 (H) 09/28/2017     CURRENT MEDICATIONS: No current outpatient medications on file. (Ophthalmic Drugs)   No current facility-administered medications for this visit. (Ophthalmic Drugs)   Current Outpatient Medications (Other)  Medication Sig  . ASPIRIN LOW DOSE 81 MG EC tablet Take 81 mg by mouth at bedtime.   [name] atorvastatin (LIPITOR) 80 MG tablet Take 80 mg by mouth at bedtime.   . cetirizine (ZYRTEC) 10 MG tablet Take 10 mg by mouth daily as needed.   . Cholecalciferol (VITAMIN D3) 50 MCG (2000 UT) TABS Take 1 tablet by mouth daily.  . clopidogrel (PLAVIX) 75 MG tablet Take 75 mg by mouth every morning.  [name] COVID-19 mRNA vaccine, Pfizer, 30 MCG/0.3ML injection INJECT AS DIRECTED  . cyclobenzaprine (FLEXERIL) 10 MG tablet Take 10 mg by mouth as needed (muscle pain).   . ferrous sulfate 325 (65 FE) MG tablet Take 325 mg by mouth as directed. On Monday, Wednesday and Friday at bedtime  . gabapentin (NEURONTIN) 300 MG capsule Take 300 mg  by mouth at bedtime.  . Glucosamine-Chondroitin (COSAMIN DS PO) Take 1 capsule by mouth at bedtime.  [name] LANTUS SOLOSTAR 100 UNIT/ML Solostar Pen Inject 45 Units into the skin every morning.   [name] losartan (COZAAR) 100 MG tablet Take 100 mg by mouth in the morning.   [name] omeprazole (PRILOSEC) 40 MG capsule Take 40 mg by mouth every morning.  . Polyethylene Glycol 3350 (MIRALAX PO) Take by mouth at bedtime.  . potassium citrate (UROCIT-K) 5 MEQ (540 MG) SR tablet Take 15 mEq by mouth every morning.  . propranolol (INDERAL) 20 MG tablet Take 40-60 mg by mouth every morning.  [name] tiZANidine (ZANAFLEX) 4 MG tablet Take 4 mg by mouth every 6 (six) hours as needed for muscle spasms.   No current facility-administered medications for this visit. (Other)      REVIEW OF SYSTEMS:    ALLERGIES Allergies  Allergen Reactions  . Amlodipine Besylate Other (See Comments)    HIVES  . Codeine Nausea And Vomiting  . Penicillins Other (See Comments)    Childhood reaction Has patient had a PCN reaction causing immediate rash, facial/tongue/throat swelling, SOB or lightheadedness with hypotension: Unknown Has patient had a PCN reaction causing severe rash involving mucus membranes or skin necrosis: Unknown Has patient had a PCN reaction that required  hospitalization: Unknown Has patient had a PCN reaction occurring within the last 10 years: Unknown If all of the above answers are "NO", then may proceed with Cephalosporin use.    . Sulfa Antibiotics Nausea And Vomiting    PAST MEDICAL HISTORY Past Medical History:  Diagnosis Date  . Arthritis   . Bowel obstruction ([place])   . CKD (chronic kidney disease)    unkknown stage per patient  . Diabetes mellitus ([place])   . Hyperlipidemia   . Hypertension   . Kidney stones    Past Surgical History:  Procedure Laterality Date  . COLONOSCOPY  10/2015   Dr Kenton Kingfisher  . COLOSTOMY REVISION Right 09/29/2017   Procedure: COLON RESECTION RIGHT;  Surgeon: Jovita Kussmaul, MD;  Location: [facility];  Service: General;  Laterality: Right;  . ELBOW SURGERY     ulnar nerve repair  . ESOPHAGOGASTRODUODENOSCOPY     same time   . FOOT SURGERY     crushed heel  . LAPAROTOMY N/A 09/29/2017   Procedure: EXPLORATORY LAPAROTOMY;  Surgeon: Jovita Kussmaul, MD;  Location: [facility];  Service: General;  Laterality: N/A;    FAMILY HISTORY Family History  Problem Relation Age of Onset  . Kidney Stones Mother   . Heart disease Mother   . Colon cancer Neg Hx   . Esophageal cancer Neg Hx     SOCIAL HISTORY Social History   Tobacco Use  . Smoking status: Former Research scientist (life sciences)  . Smokeless tobacco: Never Used  . Tobacco comment: quit over 40 years ago.Around age [age]   Vaping Use  . Vaping Use: Never used  Substance Use Topics  . Alcohol use: Yes    Alcohol/week: 0.0 standard drinks    Comment: occasional  . Drug use: No         OPHTHALMIC EXAM: Base Eye Exam    Visual Acuity (ETDRS)      Right Left   Dist [place] 20/70 +1 20/20   Dist ph [place] 20/40 +2        Tonometry (Tonopen, 8:22 AM)      Right Left   Pressure 13 14       Pupils      Pupils Dark Light Shape React APD   Right PERRL 4 3 Round Minimal None   Left PERRL 4 3 Round Minimal None       Visual Fields (Counting fingers)      Left Right    Full Full       Extraocular Movement      Right Left    Full Full       Neuro/Psych    Oriented x3: Yes   Mood/Affect: Normal       Dilation    Left eye: 1.0% Mydriacyl, 2.5% Phenylephrine @ 8:22 AM        Slit Lamp and Fundus Exam    External Exam      Right Left   External Normal Normal       Slit Lamp Exam      Right Left   Lids/Lashes Normal Normal   Conjunctiva/Sclera White and quiet White and quiet   Cornea Clear Clear   Anterior Chamber Deep and quiet Deep and quiet   Iris Round and reactive Round and reactive   Lens Centered posterior chamber intraocular lens Centered posterior chamber intraocular lens   Anterior Vitreous  Normal Normal       Fundus Exam      Right Left  Posterior Vitreous  Normal   Disc  Normal   C/D Ratio  0.2   Macula  Microaneurysms, Mild clinically significant macular edema. temporally   Vessels  NPDR- Moderate   Periphery  Normal          IMAGING AND PROCEDURES  Imaging and Procedures for 12/30/20  OCT, Retina - OU - Both Eyes       Right Eye Quality was good. Scan locations included subfoveal. Central Foveal Thickness: 266. Progression has improved. Findings include vitreomacular adhesion .   Left Eye Quality was good. Scan locations included subfoveal. Central Foveal Thickness: 287. Progression has worsened. Findings include cystoid macular edema, vitreomacular adhesion .   Notes OD with much less CSME, perifoveal as compared to onset and as compared to last visit, September 17, 2020.    OS with new minor CME, CSME yet with good acuity, for commencement of therapy with intravitreal Avastin OS today       Intravitreal Injection, Pharmacologic Agent - OS - Left Eye       Time Out 12/30/2020. 9:18 AM. Confirmed correct patient, procedure, site, and patient consented.   Anesthesia Topical anesthesia was used. Anesthetic medications included Akten 3.5%.   Procedure Preparation included Tobramycin 0.3%, 10% betadine to eyelids, 5% betadine to ocular surface. A 30 gauge needle was used.   Injection:  2.5 mg Bevacizumab (AVASTIN) 2.5mg /0.106mL SOSY   NDC: 98921-194-17, Lot: [id number]   Route: Intravitreal, Site: Left Eye  Post-op Post injection exam found visual acuity of at least counting fingers. The patient tolerated the procedure well. There were no complications. The patient received written and verbal post procedure care education. Post injection medications were not given.                 ASSESSMENT/PLAN:  Severe nonproliferative diabetic retinopathy of left eye, with macular edema, associated with type 2 diabetes mellitus (HCC) CSME component  much improved as compared to onset.  We will commence therapy in the left eye to stabilize severe NPDR but also with the multifocal CSME adjacent to the macula and seen on OCT and FFA dated 12-16-2020      ICD-10-CM   1. Severe nonproliferative diabetic retinopathy of left eye, with macular edema, associated with type 2 diabetes mellitus (HCC)  J85.6314 Intravitreal Injection, Pharmacologic Agent - OS - Left Eye    bevacizumab (AVASTIN) SOSY 2.5 mg  2. Severe nonproliferative diabetic retinopathy of right eye, with macular edema, associated with type 2 diabetes mellitus (HCC)  E11.3411 OCT, Retina - OU - Both Eyes    1.  OCT findings and correlation with fluorescein angiogram from 12-16-2020 were reviewed with the patient and family.  Commencement of therapy left eye with intravitreal Avastin to control the small region of focal CSME inferotemporally.  2.  Return visit 3 months dilate OU no planned injection, intervention at that time as per findings  3.  Ophthalmic Meds Ordered this visit:  Meds ordered this encounter  Medications  . bevacizumab (AVASTIN) SOSY 2.5 mg       Return in about 3 months (around 03/31/2021) for DILATE OU, OCT.  There are no Patient Instructions on file for this visit.   Explained the diagnoses, plan, and follow up with the patient and they expressed understanding.  Patient expressed understanding of the importance of proper follow up care.   Clent Demark Aisea Bouldin M.D. Diseases & Surgery of the Retina and Vitreous Retina & Diabetic [place] 12/30/20     Abbreviations: Jerilynn Mages  myopia (nearsighted); A astigmatism; H hyperopia (farsighted); P presbyopia; Mrx spectacle prescription;  CTL contact lenses; OD right eye; OS left eye; OU both eyes  XT exotropia; ET esotropia; PEK punctate epithelial keratitis; PEE punctate epithelial erosions; DES dry eye syndrome; MGD meibomian gland dysfunction; ATs artificial tears; PFAT's preservative free artificial tears; [place]  nuclear sclerotic cataract; PSC posterior subcapsular cataract; ERM epi-retinal membrane; PVD posterior vitreous detachment; RD retinal detachment; DM diabetes mellitus; DR diabetic retinopathy; NPDR non-proliferative diabetic retinopathy; PDR proliferative diabetic retinopathy; CSME clinically significant macular edema; DME diabetic macular edema; dbh dot blot hemorrhages; CWS cotton wool spot; POAG primary open angle glaucoma; C/D cup-to-disc ratio; HVF humphrey visual field; GVF goldmann visual field; OCT optical coherence tomography; IOP intraocular pressure; BRVO Branch retinal vein occlusion; CRVO central retinal vein occlusion; CRAO central retinal artery occlusion; BRAO branch retinal artery occlusion; RT retinal tear; SB scleral buckle; PPV pars plana vitrectomy; VH Vitreous hemorrhage; PRP panretinal laser photocoagulation; IVK intravitreal kenalog; VMT vitreomacular traction; MH Macular hole;  NVD neovascularization of the disc; NVE neovascularization elsewhere; AREDS age related eye disease study; ARMD age related macular degeneration; POAG primary open angle glaucoma; EBMD epithelial/anterior basement membrane dystrophy; ACIOL anterior chamber intraocular lens; IOL intraocular lens; PCIOL posterior chamber intraocular lens; Phaco/IOL phacoemulsification with intraocular lens placement; [place] photorefractive keratectomy; LASIK laser assisted in situ keratomileusis; HTN hypertension; DM diabetes mellitus; COPD chronic obstructive pulmonary disease

## 2021-03-25 DIAGNOSIS — E1122 Type 2 diabetes mellitus with diabetic chronic kidney disease: Secondary | ICD-10-CM | POA: Diagnosis not present

## 2021-03-25 DIAGNOSIS — N1831 Chronic kidney disease, stage 3a: Secondary | ICD-10-CM | POA: Diagnosis not present

## 2021-03-25 DIAGNOSIS — K219 Gastro-esophageal reflux disease without esophagitis: Secondary | ICD-10-CM | POA: Diagnosis not present

## 2021-03-25 DIAGNOSIS — M25512 Pain in left shoulder: Secondary | ICD-10-CM | POA: Diagnosis not present

## 2021-03-25 DIAGNOSIS — M25612 Stiffness of left shoulder, not elsewhere classified: Secondary | ICD-10-CM | POA: Diagnosis not present

## 2021-03-25 DIAGNOSIS — Z794 Long term (current) use of insulin: Secondary | ICD-10-CM | POA: Diagnosis not present

## 2021-03-25 DIAGNOSIS — E7800 Pure hypercholesterolemia, unspecified: Secondary | ICD-10-CM | POA: Diagnosis not present

## 2021-03-25 DIAGNOSIS — G459 Transient cerebral ischemic attack, unspecified: Secondary | ICD-10-CM | POA: Diagnosis not present

## 2021-03-25 DIAGNOSIS — R29898 Other symptoms and signs involving the musculoskeletal system: Secondary | ICD-10-CM | POA: Diagnosis not present

## 2021-03-31 ENCOUNTER — Encounter (INDEPENDENT_AMBULATORY_CARE_PROVIDER_SITE_OTHER): Payer: HMO | Admitting: Ophthalmology

## 2021-04-14 ENCOUNTER — Other Ambulatory Visit: Payer: Self-pay

## 2021-04-14 ENCOUNTER — Encounter (INDEPENDENT_AMBULATORY_CARE_PROVIDER_SITE_OTHER): Payer: Self-pay | Admitting: Ophthalmology

## 2021-04-14 ENCOUNTER — Ambulatory Visit (INDEPENDENT_AMBULATORY_CARE_PROVIDER_SITE_OTHER): Payer: No Typology Code available for payment source | Admitting: Ophthalmology

## 2021-04-14 DIAGNOSIS — G4733 Obstructive sleep apnea (adult) (pediatric): Secondary | ICD-10-CM

## 2021-04-14 DIAGNOSIS — E113412 Type 2 diabetes mellitus with severe nonproliferative diabetic retinopathy with macular edema, left eye: Secondary | ICD-10-CM

## 2021-04-14 DIAGNOSIS — E113411 Type 2 diabetes mellitus with severe nonproliferative diabetic retinopathy with macular edema, right eye: Secondary | ICD-10-CM

## 2021-04-14 MED ORDER — BEVACIZUMAB 2.5 MG/0.1ML IZ SOSY
2.5000 mg | PREFILLED_SYRINGE | INTRAVITREAL | Status: AC | PRN
  Administered 2021-04-14: 2.5 mg via INTRAVITREAL

## 2021-04-14 NOTE — Progress Notes (Signed)
04/14/2021     CHIEF COMPLAINT Patient presents for Retina Follow Up (3 months fu OU and OCT/Pt states VA OU stable since last visit. Pt denies FOL, floaters, or ocular pain OU. [place]ie [place]: 6.4/LBS: 120/)   HISTORY OF PRESENT ILLNESS: Alex Mccarty is a 75 y.o. male who presents to the clinic today for:   HPI     Retina Follow Up           Diagnosis: Diabetic Retinopathy   Laterality: both eyes   Onset: 3 months ago   Severity: mild   Duration: 3 months   Course: stable   Comments: 3 months fu OU and OCT Pt states VA OU stable since last visit. Pt denies FOL, floaters, or ocular pain OU.  A1C: 6.4 LBS: 120        Last edited by Kendra Opitz, COA on 04/14/2021  8:08 AM.      Referring physician: Elisabeth Cara, PA-C [address] [place],  [place] [address]  HISTORICAL INFORMATION:   Selected notes from the MEDICAL RECORD NUMBER    Lab Results  Component Value Date   HGBA1C 7.0 (H) 09/28/2017     CURRENT MEDICATIONS: No current outpatient medications on file. (Ophthalmic Drugs)   No current facility-administered medications for this visit. (Ophthalmic Drugs)   Current Outpatient Medications (Other)  Medication Sig   ASPIRIN LOW DOSE 81 MG EC tablet Take 81 mg by mouth at bedtime.    atorvastatin (LIPITOR) 80 MG tablet Take 80 mg by mouth at bedtime.    cetirizine (ZYRTEC) 10 MG tablet Take 10 mg by mouth daily as needed.    Cholecalciferol (VITAMIN D3) 50 MCG (2000 UT) TABS Take 1 tablet by mouth daily.   clopidogrel (PLAVIX) 75 MG tablet Take 75 mg by mouth every morning.   COVID-19 mRNA vaccine, Pfizer, 30 MCG/0.3ML injection INJECT AS DIRECTED   cyclobenzaprine (FLEXERIL) 10 MG tablet Take 10 mg by mouth as needed (muscle pain).    ferrous sulfate 325 (65 FE) MG tablet Take 325 mg by mouth as directed. On Monday, Wednesday and Friday at bedtime   gabapentin (NEURONTIN) 300 MG capsule Take 300 mg by mouth at bedtime.    Glucosamine-Chondroitin (COSAMIN DS PO) Take 1 capsule by mouth at bedtime.   LANTUS SOLOSTAR 100 UNIT/ML Solostar Pen Inject 45 Units into the skin every morning.    losartan (COZAAR) 100 MG tablet Take 100 mg by mouth in the morning.    omeprazole (PRILOSEC) 40 MG capsule Take 40 mg by mouth every morning.   Polyethylene Glycol 3350 (MIRALAX PO) Take by mouth at bedtime.   potassium citrate (UROCIT-K) 5 MEQ (540 MG) SR tablet Take 15 mEq by mouth every morning.   propranolol (INDERAL) 20 MG tablet Take 40-60 mg by mouth every morning.   tiZANidine (ZANAFLEX) 4 MG tablet Take 4 mg by mouth every 6 (six) hours as needed for muscle spasms.   No current facility-administered medications for this visit. (Other)      REVIEW OF SYSTEMS:    ALLERGIES Allergies  Allergen Reactions   Amlodipine Besylate Other (See Comments)    HIVES   Codeine Nausea And Vomiting   Penicillins Other (See Comments)    Childhood reaction Has patient had a PCN reaction causing immediate rash, facial/tongue/throat swelling, SOB or lightheadedness with hypotension: Unknown Has patient had a PCN reaction causing severe rash involving mucus membranes or skin necrosis: Unknown Has patient had a PCN  reaction that required hospitalization: Unknown Has patient had a PCN reaction occurring within the last 10 years: Unknown If all of the above answers are "NO", then may proceed with Cephalosporin use.     Sulfa Antibiotics Nausea And Vomiting    PAST MEDICAL HISTORY Past Medical History:  Diagnosis Date   Arthritis    Bowel obstruction (HCC)    CKD (chronic kidney disease)    unkknown stage per patient   Diabetes mellitus ([place])    Hyperlipidemia    Hypertension    Kidney stones    Past Surgical History:  Procedure Laterality Date   COLONOSCOPY  10/2015   Dr Kenton Kingfisher   COLOSTOMY REVISION Right 09/29/2017   Procedure: COLON RESECTION RIGHT;  Surgeon: Jovita Kussmaul, MD;  Location: [facility];  Service:  General;  Laterality: Right;   ELBOW SURGERY     ulnar nerve repair   ESOPHAGOGASTRODUODENOSCOPY     same time    FOOT SURGERY     crushed heel   LAPAROTOMY N/A 09/29/2017   Procedure: EXPLORATORY LAPAROTOMY;  Surgeon: Jovita Kussmaul, MD;  Location: [facility];  Service: General;  Laterality: N/A;    FAMILY HISTORY Family History  Problem Relation Age of Onset   Kidney Stones Mother    Heart disease Mother    Colon cancer Neg Hx    Esophageal cancer Neg Hx     SOCIAL HISTORY Social History   Tobacco Use   Smoking status: Former   Smokeless tobacco: Never   Tobacco comments:    quit over 40 years ago.Around age [age]   Vaping Use   Vaping Use: Never used  Substance Use Topics   Alcohol use: Yes    Alcohol/week: 0.0 standard drinks    Comment: occasional   Drug use: No         OPHTHALMIC EXAM:  Base Eye Exam     Visual Acuity (ETDRS)       Right Left   Dist [place] 20/40 -1 20/20 -1   Dist ph [place] 20/25          Tonometry (Tonopen, 8:09 AM)       Right Left   Pressure 14 14         Pupils       Pupils Dark Light Shape React APD   Right PERRL 4 3 Round Brisk None   Left PERRL 4 3 Round Brisk None         Visual Fields (Counting fingers)       Left Right    Full Full         Extraocular Movement       Right Left    Full Full         Neuro/Psych     Oriented x3: Yes   Mood/Affect: Normal         Dilation     Both eyes: 1.0% Mydriacyl, 2.5% Phenylephrine @ 8:09 AM           Slit Lamp and Fundus Exam     External Exam       Right Left   External Normal Normal         Slit Lamp Exam       Right Left   Lids/Lashes Normal Normal   Conjunctiva/Sclera White and quiet White and quiet   Cornea Clear Clear   Anterior Chamber Deep and quiet Deep and quiet   Iris Round and reactive Round and reactive  Lens Centered posterior chamber intraocular lens Centered posterior chamber intraocular lens   Anterior Vitreous Normal  Normal         Fundus Exam       Right Left   Posterior Vitreous Normal Normal   Disc Normal Normal   C/D Ratio 0.3 0.2   Macula Microaneurysms temporally , Mild clinically significant macular edema Microaneurysms, Mild clinically significant macular edema. temporally   Vessels NPDR-Severe NPDR- Moderate   Periphery Normal Normal            IMAGING AND PROCEDURES  Imaging and Procedures for 04/14/21  OCT, Retina - OU - Both Eyes       Right Eye Quality was good. Scan locations included subfoveal. Central Foveal Thickness: 291. Progression has improved. Findings include vitreomacular adhesion .   Left Eye Quality was good. Scan locations included subfoveal. Central Foveal Thickness: 297. Progression has worsened. Findings include cystoid macular edema, vitreomacular adhesion .   Notes  OD, recurrent CSME temporal to the fovea, currently at 3 months postinjection.  Small intraretinal fibrotic area in the parafoveal region probably accounts for his small scotoma noted where 1 letter is missing and per word  OS, much improved CSME, small area inferotemporal will observe        Intravitreal Injection, Pharmacologic Agent - OD - Right Eye       Time Out 04/14/2021. 8:59 AM. Confirmed correct patient, procedure, site, and patient consented.   Anesthesia Topical anesthesia was used. Anesthetic medications included Akten 3.5%.   Procedure Preparation included Tobramycin 0.3%, 10% betadine to eyelids, 5% betadine to ocular surface. A 30 gauge needle was used.   Injection: 2.5 mg bevacizumab 2.5 MG/0.1ML   Route: Intravitreal, Site: Right Eye   NDC: [phone], Lot: [id number]   Post-op Post injection exam found visual acuity of at least counting fingers. The patient tolerated the procedure well. There were no complications. The patient received written and verbal post procedure care education. Post injection medications were not given.               ASSESSMENT/PLAN:  Severe nonproliferative diabetic retinopathy of right eye, with macular edema, associated with type 2 diabetes mellitus (HCC) Slight recurrence of CSME temporal fovea at 47-month interval.  We will repeat injection at Avastin OD today and examination next in 8 weeks  Severe nonproliferative diabetic retinopathy of left eye, with macular edema, associated with type 2 diabetes mellitus ([place]) No center involvement of CSME OS, small area inferotemporal we will continue to manage by observation     ICD-10-CM   1. Severe nonproliferative diabetic retinopathy of right eye, with macular edema, associated with type 2 diabetes mellitus (HCC)  E11.3411 OCT, Retina - OU - Both Eyes    Intravitreal Injection, Pharmacologic Agent - OD - Right Eye    bevacizumab (AVASTIN) SOSY 2.5 mg    2. Severe nonproliferative diabetic retinopathy of left eye, with macular edema, associated with type 2 diabetes mellitus (HCC)  E75.1700 OCT, Retina - OU - Both Eyes    3. OSA (obstructive sleep apnea)  G47.33       1.  Much improved CSME overall as compared to onset of therapy yet still with recurrent CSME temporally OD.  We will treat today with intravitreal Avastin.  2.  Dilate OU next in 8 weeks and follow-up possible Avastin OD  3.  Ophthalmic Meds Ordered this visit:  Meds ordered this encounter  Medications   bevacizumab (AVASTIN) SOSY 2.5 mg  Return in about 8 weeks (around 06/09/2021) for DILATE OU, AVASTIN OCT, OD.  There are no Patient Instructions on file for this visit.   Explained the diagnoses, plan, and follow up with the patient and they expressed understanding.  Patient expressed understanding of the importance of proper follow up care.   Clent Demark Izel Eisenhardt M.D. Diseases & Surgery of the Retina and Vitreous Retina & Diabetic [place] 04/14/21     Abbreviations: M myopia (nearsighted); A astigmatism; H hyperopia (farsighted); P presbyopia; Mrx spectacle  prescription;  CTL contact lenses; OD right eye; OS left eye; OU both eyes  XT exotropia; ET esotropia; PEK punctate epithelial keratitis; PEE punctate epithelial erosions; DES dry eye syndrome; MGD meibomian gland dysfunction; ATs artificial tears; PFAT's preservative free artificial tears; [place] nuclear sclerotic cataract; PSC posterior subcapsular cataract; ERM epi-retinal membrane; PVD posterior vitreous detachment; RD retinal detachment; DM diabetes mellitus; DR diabetic retinopathy; NPDR non-proliferative diabetic retinopathy; PDR proliferative diabetic retinopathy; CSME clinically significant macular edema; DME diabetic macular edema; dbh dot blot hemorrhages; CWS cotton wool spot; POAG primary open angle glaucoma; C/D cup-to-disc ratio; HVF humphrey visual field; GVF goldmann visual field; OCT optical coherence tomography; IOP intraocular pressure; BRVO Branch retinal vein occlusion; CRVO central retinal vein occlusion; CRAO central retinal artery occlusion; BRAO branch retinal artery occlusion; RT retinal tear; SB scleral buckle; PPV pars plana vitrectomy; VH Vitreous hemorrhage; PRP panretinal laser photocoagulation; IVK intravitreal kenalog; VMT vitreomacular traction; MH Macular hole;  NVD neovascularization of the disc; NVE neovascularization elsewhere; AREDS age related eye disease study; ARMD age related macular degeneration; POAG primary open angle glaucoma; EBMD epithelial/anterior basement membrane dystrophy; ACIOL anterior chamber intraocular lens; IOL intraocular lens; PCIOL posterior chamber intraocular lens; Phaco/IOL phacoemulsification with intraocular lens placement; [place] photorefractive keratectomy; LASIK laser assisted in situ keratomileusis; HTN hypertension; DM diabetes mellitus; COPD chronic obstructive pulmonary disease

## 2021-04-14 NOTE — Assessment & Plan Note (Signed)
No center involvement of CSME OS, small area inferotemporal we will continue to manage by observation

## 2021-04-14 NOTE — Assessment & Plan Note (Signed)
Slight recurrence of CSME temporal fovea at 71-month interval.  We will repeat injection at Avastin OD today and examination next in 8 weeks

## 2021-05-01 ENCOUNTER — Observation Stay (HOSPITAL_COMMUNITY)
Admission: EM | Admit: 2021-05-01 | Discharge: 2021-05-03 | Disposition: A | Discharge status: Home / Self Care | Payer: No Typology Code available for payment source | Attending: Internal Medicine | Admitting: Internal Medicine

## 2021-05-01 ENCOUNTER — Observation Stay (HOSPITAL_COMMUNITY): Payer: No Typology Code available for payment source

## 2021-05-01 ENCOUNTER — Encounter (HOSPITAL_COMMUNITY): Payer: Self-pay | Admitting: *Deleted

## 2021-05-01 ENCOUNTER — Other Ambulatory Visit: Payer: Self-pay

## 2021-05-01 ENCOUNTER — Emergency Department (HOSPITAL_COMMUNITY): Payer: No Typology Code available for payment source

## 2021-05-01 DIAGNOSIS — I129 Hypertensive chronic kidney disease with stage 1 through stage 4 chronic kidney disease, or unspecified chronic kidney disease: Secondary | ICD-10-CM | POA: Insufficient documentation

## 2021-05-01 DIAGNOSIS — H8110 Benign paroxysmal vertigo, unspecified ear: Secondary | ICD-10-CM | POA: Diagnosis not present

## 2021-05-01 DIAGNOSIS — U071 COVID-19: Secondary | ICD-10-CM | POA: Diagnosis not present

## 2021-05-01 DIAGNOSIS — Z79899 Other long term (current) drug therapy: Secondary | ICD-10-CM | POA: Diagnosis not present

## 2021-05-01 DIAGNOSIS — Z87891 Personal history of nicotine dependence: Secondary | ICD-10-CM | POA: Insufficient documentation

## 2021-05-01 DIAGNOSIS — R4182 Altered mental status, unspecified: Secondary | ICD-10-CM | POA: Diagnosis not present

## 2021-05-01 DIAGNOSIS — R059 Cough, unspecified: Secondary | ICD-10-CM | POA: Diagnosis not present

## 2021-05-01 DIAGNOSIS — Z7982 Long term (current) use of aspirin: Secondary | ICD-10-CM | POA: Insufficient documentation

## 2021-05-01 DIAGNOSIS — E1122 Type 2 diabetes mellitus with diabetic chronic kidney disease: Secondary | ICD-10-CM | POA: Insufficient documentation

## 2021-05-01 DIAGNOSIS — Z7902 Long term (current) use of antithrombotics/antiplatelets: Secondary | ICD-10-CM | POA: Diagnosis not present

## 2021-05-01 DIAGNOSIS — N1831 Chronic kidney disease, stage 3a: Secondary | ICD-10-CM | POA: Insufficient documentation

## 2021-05-01 DIAGNOSIS — R413 Other amnesia: Secondary | ICD-10-CM | POA: Diagnosis not present

## 2021-05-01 DIAGNOSIS — R42 Dizziness and giddiness: Secondary | ICD-10-CM

## 2021-05-01 DIAGNOSIS — G459 Transient cerebral ischemic attack, unspecified: Secondary | ICD-10-CM | POA: Diagnosis present

## 2021-05-01 LAB — COMPREHENSIVE METABOLIC PANEL
ALT: 26 U/L (ref 0–44)
AST: 20 U/L (ref 15–41)
Albumin: 3.2 g/dL — ABNORMAL LOW (ref 3.5–5.0)
Alkaline Phosphatase: 49 U/L (ref 38–126)
Anion gap: 6 (ref 5–15)
BUN: 26 mg/dL — ABNORMAL HIGH (ref 8–23)
CO2: 22 mmol/L (ref 22–32)
Calcium: 8.7 mg/dL — ABNORMAL LOW (ref 8.9–10.3)
Chloride: 109 mmol/L (ref 98–111)
Creatinine, Ser: 2.41 mg/dL — ABNORMAL HIGH (ref 0.61–1.24)
GFR, Estimated: 28 mL/min — ABNORMAL LOW (ref >60)
Glucose, Bld: 149 mg/dL — ABNORMAL HIGH (ref 70–99)
Potassium: 4.2 mmol/L (ref 3.5–5.1)
Sodium: 137 mmol/L (ref 135–145)
Total Bilirubin: 0.8 mg/dL (ref 0.3–1.2)
Total Protein: 6.1 g/dL — ABNORMAL LOW (ref 6.5–8.1)

## 2021-05-01 LAB — CBG MONITORING, ED
Glucose-Capillary: 112 mg/dL — ABNORMAL HIGH (ref 70–99)
Glucose-Capillary: 136 mg/dL — ABNORMAL HIGH (ref 70–99)

## 2021-05-01 LAB — URINALYSIS, ROUTINE W REFLEX MICROSCOPIC
Bacteria, UA: NONE SEEN
Bilirubin Urine: NEGATIVE
Glucose, UA: 50 mg/dL — AB
Ketones, ur: NEGATIVE mg/dL
Leukocytes,Ua: NEGATIVE
Nitrite: NEGATIVE
Protein, ur: 100 mg/dL — AB
Specific Gravity, Urine: 1.014 (ref 1.005–1.030)
pH: 5 (ref 5.0–8.0)

## 2021-05-01 LAB — RAPID URINE DRUG SCREEN, HOSP PERFORMED
Amphetamines: NOT DETECTED
Barbiturates: NOT DETECTED
Benzodiazepines: NOT DETECTED
Cocaine: NOT DETECTED
Opiates: NOT DETECTED
Tetrahydrocannabinol: NOT DETECTED

## 2021-05-01 LAB — RESP PANEL BY RT-PCR (FLU A&B, COVID) ARPGX2
Influenza A by PCR: NEGATIVE
Influenza B by PCR: NEGATIVE
SARS Coronavirus 2 by RT PCR: POSITIVE — AB

## 2021-05-01 LAB — CBC
HCT: 40 % (ref 39.0–52.0)
Hemoglobin: 12.6 g/dL — ABNORMAL LOW (ref 13.0–17.0)
MCH: 30.7 pg (ref 26.0–34.0)
MCHC: 31.5 g/dL (ref 30.0–36.0)
MCV: 97.6 fL (ref 80.0–100.0)
Platelets: 91 10*3/uL — ABNORMAL LOW (ref 150–400)
RBC: 4.1 MIL/uL — ABNORMAL LOW (ref 4.22–5.81)
RDW: 14.3 % (ref 11.5–15.5)
WBC: 6.4 10*3/uL (ref 4.0–10.5)
nRBC: 0 % (ref 0.0–0.2)

## 2021-05-01 LAB — APTT: aPTT: 27 seconds (ref 24–36)

## 2021-05-01 LAB — HEMOGLOBIN A1C
Hgb A1c MFr Bld: 6.6 % — ABNORMAL HIGH (ref 4.8–5.6)
Mean Plasma Glucose: 142.72 mg/dL

## 2021-05-01 LAB — PROTIME-INR
INR: 1.1 (ref 0.8–1.2)
Prothrombin Time: 14.5 seconds (ref 11.4–15.2)

## 2021-05-01 MED ORDER — ATORVASTATIN CALCIUM 80 MG PO TABS
80.0000 mg | ORAL_TABLET | Freq: Every day | ORAL | Status: DC
  Administered 2021-05-01 – 2021-05-02 (×2): 80 mg via ORAL
  Filled 2021-05-01 (×2): qty 1

## 2021-05-01 MED ORDER — ENOXAPARIN SODIUM 30 MG/0.3ML IJ SOSY
30.0000 mg | PREFILLED_SYRINGE | INTRAMUSCULAR | Status: DC
  Administered 2021-05-01 – 2021-05-02 (×2): 30 mg via SUBCUTANEOUS
  Filled 2021-05-01 (×2): qty 0.3

## 2021-05-01 MED ORDER — ACETAMINOPHEN 325 MG PO TABS: 650.0000 mg | ORAL_TABLET | ORAL | Status: DC | PRN

## 2021-05-01 MED ORDER — INSULIN ASPART 100 UNIT/ML IJ SOLN
0.0000 [IU] | Freq: Every day | INTRAMUSCULAR | Status: DC
Start: 2021-05-01 — End: 2021-05-03

## 2021-05-01 MED ORDER — LACTATED RINGERS IV BOLUS
500.0000 mL | Freq: Once | INTRAVENOUS | Status: AC
  Administered 2021-05-01: 500 mL via INTRAVENOUS

## 2021-05-01 MED ORDER — CLOPIDOGREL BISULFATE 75 MG PO TABS
75.0000 mg | ORAL_TABLET | Freq: Every day | ORAL | Status: DC
  Administered 2021-05-01 – 2021-05-03 (×3): 75 mg via ORAL
  Filled 2021-05-01 (×3): qty 1

## 2021-05-01 MED ORDER — ASPIRIN 81 MG PO CHEW
81.0000 mg | CHEWABLE_TABLET | Freq: Every day | ORAL | Status: DC
  Administered 2021-05-01 – 2021-05-02 (×2): 81 mg via ORAL
  Filled 2021-05-01 (×2): qty 1

## 2021-05-01 MED ORDER — ACETAMINOPHEN 650 MG RE SUPP: 650.0000 mg | RECTAL | Status: DC | PRN

## 2021-05-01 MED ORDER — ACETAMINOPHEN 160 MG/5ML PO SOLN: 650.0000 mg | ORAL | Status: DC | PRN

## 2021-05-01 MED ORDER — INSULIN ASPART 100 UNIT/ML IJ SOLN
0.0000 [IU] | Freq: Three times a day (TID) | INTRAMUSCULAR | Status: DC
  Administered 2021-05-02: 3 [IU] via SUBCUTANEOUS
  Administered 2021-05-03: 2 [IU] via SUBCUTANEOUS

## 2021-05-01 MED ORDER — PANTOPRAZOLE SODIUM 40 MG PO TBEC
40.0000 mg | DELAYED_RELEASE_TABLET | Freq: Every day | ORAL | Status: DC
  Administered 2021-05-01 – 2021-05-03 (×3): 40 mg via ORAL
  Filled 2021-05-01 (×3): qty 1

## 2021-05-01 NOTE — Hospital Course (Addendum)
Feels good today.  He would like vestibular PT as outpatient to help with his vertigo. Discussed we will be able to provide this.

## 2021-05-01 NOTE — ED Notes (Signed)
Family updated as to patient's status.

## 2021-05-01 NOTE — ED Notes (Signed)
Pt NAD in bed with family at bedside. A/ox4, speech clear. Per pt and family, around 2100 last noc pt began to have garbled, slurred speech with loss of balance and incontinence. Pt states symptoms cleared up while here. Pt currently NIHSS 0. VSS.

## 2021-05-01 NOTE — H&P (Addendum)
Date: 05/01/2021               Patient Name:  Alex Mccarty MRN: [id number]  DOB: [birth year] Age / Sex: 75 y.o., male   PCP: Elisabeth Cara, PA-C         Medical Service: Internal Medicine Teaching Service         Attending Physician: Dr. Jimmye Norman, Elaina Pattee, MD    First Contact: Dr. Jeanice Lim Pager: [phone]  Second Contact: Dr. Allyson Sabal Pager: [phone]       After Hours (After 5p/  First Contact Pager: [phone]  weekends / holidays): Second Contact Pager: [phone]   Chief Complaint: Dizziness, slurred speech, difficulty walking, urinary incontinence  History of Present Illness: I seen and evaluated Alex Mccarty at bedside. He was accompanied by his wife at bedside. He stated that he was in his usual state of health until yesterday around 4PM he began to feel dizzy. He stated that he was furniture shopping with his wife and bend down to look at something. Upon standing up straight, the dizziness began. He could not estimate how long the dizziness lasted, but reports the dizziness would come and go throughout the day. When he got home, Alex Mccarty stated that the dizziness continued. He was on his way to the bathroom in his home and reports staggering due to feeling weakness in his left leg. He did not make it to the bathroom and he experienced urinary incontinence at that time. Alex Mccarty did not seek medical attention at that time and later on went to bed. He stated that he was experiencing chills and slept with several layers of blankets. He denies any night sweats. Around 4AM, Alex Mccarty woke to go to the bathroom. Again, he experienced dizziness, staggering while walking due to left leg weakness and urinary incontinence. He did not make it to the bathroom. The next morning around 7AM today, he woke up and attempted to get out of bed. He stated he was unable to do so due to the weakness in his left leg. His wife had to help lift him out of bed. With his arms over his wife, he staggered  to the bathroom. His wife noted that Alex Mccarty had left sided facial drop and slurred speech. When they managed to get to the bathroom, Alex Mccarty was able to sit on the toilet, however he was leaning towards his left side, so much so that his wife had to hold him from falling. This episode lasted intermittently from 7AM to 10AM, he reports. He was unable to quantity how long each episode lasted. At around [place], his wife called the EMS and he was brought to the ED. In the ED, Alex Mccarty reports vomiting twice. He denies eating breakfast or taking his home medications this morning. Alex Mccarty denies LOC, changes in vision, chest pain, palpitations, SOB, abdominal pain, muscle cramping or numbness of the extremities but reports feeling fatigued for the last several days.[name] He denies any recent falls, trauma or injuries to the head.  He does however admit to polyuria and polydipsia for the past week.  He states that he works outside in his garden in the sun but reports staying hydrated by drinking lots of fluids.  He denies burning or pain with urination, denies difficulty starting stream.   Meds:  Current Meds  Medication Sig   amLODipine (NORVASC) 5 MG tablet Take 5 mg by mouth daily.   atorvastatin (LIPITOR) 80 MG tablet Take  80 mg by mouth at bedtime.    cetirizine (ZYRTEC) 10 MG tablet Take 10 mg by mouth daily as needed.    Cholecalciferol (VITAMIN D3) 50 MCG (2000 UT) TABS Take 1 tablet by mouth daily.   clopidogrel (PLAVIX) 75 MG tablet Take 75 mg by mouth every morning.   cyclobenzaprine (FLEXERIL) 10 MG tablet Take 10 mg by mouth as needed (muscle pain).    gabapentin (NEURONTIN) 300 MG capsule Take 300 mg by mouth at bedtime.   Glucosamine-Chondroitin (COSAMIN DS PO) Take 1 capsule by mouth at bedtime.   LANTUS SOLOSTAR 100 UNIT/ML Solostar Pen Inject 45 Units into the skin every morning.    losartan (COZAAR) 100 MG tablet Take 100 mg by mouth in the morning.    omeprazole (PRILOSEC) 40 MG  capsule Take 40 mg by mouth every morning.   potassium citrate (UROCIT-K) 5 MEQ (540 MG) SR tablet Take 15 mEq by mouth every morning.   propranolol (INDERAL) 20 MG tablet Take 40-60 mg by mouth every morning.   tiZANidine (ZANAFLEX) 4 MG tablet Take 4 mg by mouth every 6 (six) hours as needed for muscle spasms.     Allergies: Allergies as of 05/01/2021 - Review Complete 04/14/2021  Allergen Reaction Noted   Amlodipine besylate Other (See Comments) 11/05/2018   Codeine Nausea And Vomiting 03/09/2013   Penicillins Other (See Comments) 03/09/2013   Sulfa antibiotics Nausea And Vomiting 03/09/2013   Past Medical History:  Diagnosis Date   Arthritis    Bowel obstruction (HCC)    CKD (chronic kidney disease)    unkknown stage per patient   Diabetes mellitus ([place])    Hyperlipidemia    Hypertension    Kidney stones     Family History: Mother: deceased at age 45 caused by MI; HTN; Kidney failure; Father: Health  Sister: Alzheimer disease, deceased age 27; Brother: Healthy   Social History: Scientist, research (life sciences), 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 Works in his garden during the day. Former smoker 2-3 packs per day for 10 years (between ages [age]). Quit at age 13. Consumes alcohol occasionally, one margarita a week. Denies use of any illicit drugs.  Review of Systems: A complete ROS was negative except as per HPI.   Physical Exam: Blood pressure (!) 173/78, pulse (!) 104, temperature 97.9 F (36.6 C), temperature source Oral, resp. rate 16, height 5\' 9"  (1.753 m), weight 83.9 kg, SpO2 98 %. Physical Exam Constitutional:      General: He is awake.     Appearance: Normal appearance.  HENT:     Head: Normocephalic and atraumatic.  Cardiovascular:     Rate and Rhythm: Normal rate.     Pulses:          Radial pulses are 2+ on the right side and 2+ on the left side.       Posterior tibial pulses are 2+ on the right side and 2+ on the left side.      Heart sounds: Normal heart sounds.  Pulmonary:     Effort: Pulmonary effort is normal.     Breath sounds: Normal air entry. Examination of the right-lower field reveals rales. Decreased breath sounds and rales present.  Abdominal:     Palpations: Abdomen is soft.     Hernia: A hernia (LLQ) is present.  Musculoskeletal:     Right lower leg: No edema.     Left lower leg: No edema.     Comments:  5/5 strength of upper and lower extremities   Lymphadenopathy:     Head:     Right side of head: No submandibular adenopathy.     Left side of head: No submandibular adenopathy.     Cervical:     Right cervical: No superficial cervical adenopathy.    Left cervical: No superficial cervical adenopathy.  Skin:    General: Skin is warm and dry.  Neurological:     General: No focal deficit present.     Mental Status: He is alert and oriented to person, place, and time.     Cranial Nerves: No facial asymmetry.     Coordination: Finger-Nose-Finger Test abnormal.     Comments: Dysmetria noted   Psychiatric:        Mood and Affect: Mood and affect normal.        Speech: Speech normal.        Behavior: Behavior normal. Behavior is cooperative.        Cognition and Memory: Cognition normal.    EKG: personally reviewed my interpretation is pending  CXR: personally reviewed my interpretation is No active disease. No evidence of pneumonia or pulmonary edema.  Assessment & Plan by Problem: Active Problems:   Transient ischemic attack (TIA)  Mr. 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   Transient ischemic attack Mr. Bramel reports intermittent dizziness, slurred speech, left-sided facial droop, difficulty ambulating and several episodes of urinary incontinence that has been  ongoing since yesterday 4 PM.  Upon presentation, Mr. Carver denies having any more symptoms.  Mr. Yaffe has been worked up for TIA in 2019 and 2020.  Both both times presented with similar symptoms such as right arm weakness and right sided face numbness that occurred for about 10 minutes in duration and intermittently occurred throughout the day.  Both times CT showed no acute findings, CTA head and neck showed no abnormal findings or large vessel occlusion and MRI showed no acute intracranial abnormalities.  However he was prescribed Plavix in 2020 and reports still currently taking that medication.  There is a high suspicion for TIA given the nature of his symptoms. ABCD score: 6 (high risk). 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  Patient does have a history of BPPV, which could possibly explain the dizziness.  Patient current A1c 6.6% which is well controlled, low suspicion for diabetic complications.  There could possibly be an infection, which could explain the dizziness, incontinence, and chills. Possible to have concomitant UTI infection.  White count is within normal limits at 6.4 on CBC. UA obtained to rule out infection, pending results. --CTA of the head: Normal MR angiography of the large and medium sized vessels. --MRI of the brain: No acute brain finding. Few old small vessel cerebellar infarctions and minimal small vessel ischemic change of the cerebral hemispheric white matter. --CT of the head: No acute findings. No intracranial mass, hemorrhage or edema. Mild chronic small vessel ischemic changes in the white matter. ---Neurology consulted, recommendations greatly appreciated. ---Telemetry ordered   AKI on CKD stage III Mr. Paull's current creatinine is 2.41.  His baseline ranges from 2-2.1 he reports. Potassium levels are within normal limits at 4.2.  Mr. Boissonneault denies any pain or burning with urination, and  denies difficulty starting  stream. This is likely secondary to dehydration, Mr. Stairs reports not eating or drinking anything since 7 AM. --- Hold home medication losartan ---Repeat BMP in the AM ---IV NS bolus for hydration and for improvement in kidney function  HTN Current home medications include amlodipine 5 mg and losartan 100 mg. Initial BP reading in the ED: 173/78 and on repeat 165/79.  Patient reports that he checks his blood pressures regularly at home and they have been controlled.  Systolic ranges in the 881J and diastolic ranges in the 03P, he reports.  Patient denies checking his blood pressure yesterday. --- Hold losartan in the setting of AKI on CKD, current creatinine 2.4 --- May resume amlodipine in 24-48hrs and consider adding a second nonnephrotoxic BP medication ---Permissive HTN, if >220, treat  COVID Positive Patient has tested positive for COVID.  Recent vitals revealed that he is currently afebrile. This could possibly be an explanation for symptoms of chills and fatigue.  Patient did not have a cough on presentation. --- Place patient in airborne and contact precaution --- Monitor respiration and symptom progression, if any. --- Retest prior to discharge.   Dispo: Admit patient to Inpatient with expected length of stay greater than 2 midnights.  Signed: Timothy Lasso, MD 05/01/2021, 2:52 PM  Pager: [phone] After 5pm on weekdays and 1pm on weekends: On Call pager: [phone]

## 2021-05-01 NOTE — ED Notes (Signed)
Patient transported to MRI 

## 2021-05-01 NOTE — ED Triage Notes (Signed)
Patient presents to ed via [facility] states he wa shopping with his wife yest am started feeling dizzy , states he has a history of vertigo, went home dizziness got worse went to bed around 10 pm last night approx. 7am today was going to bathroom was leaning to the left and wasn't able to walk, speech was slurred and had nausea upon the medic unit arrival patient still had slurred speech however leaning had resolved, upon transport arrival only c/o dizziness speech was clear, Patient was given zofran 4 mg IV enroute. Upon arrival denies dizziness c/o slight frontal headache. Alert oriented.

## 2021-05-01 NOTE — Consult Note (Addendum)
Neurology Consultation  Reason for Consult: Concern for TIA Referring Physician: Dr. Jimmye Norman   CC: Transient left facial droop, leftward leaning gait disturbance, speech disturbance  History is obtained from: Patient, Patient's wife at bedside   HPI: Alex Mccarty is a 75 y.o. 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  Alex Mccarty and his wife were out shopping yesterday afternoon around 4 PM when he became dizzy but he attributed this to his history of vertigo.  The dizziness progressed and he went to bed still dizzy at 22:00.  He woke at 04:00 needing to use the restroom and states that his dizziness had intensified and that he was " bouncing off of walls" when attempting to ambulate.  At 07:00, he was unable to ambulate requiring his wife's help.  She noted that when he was attempting to walk he was leaning towards the left and noted that his speech was slurred, garbled, and unintelligible.  She also noted that he had a left-sided facial droop and activated EMS.  Patient states that when EMS arrived at 09:30 he was "coming out of it" with resolution of dysarthria but still complained of dizziness.  On arrival to the hospital, his symptoms had resolved and neurology was consulted for further evaluation.  LKW: At baseline functional and mental status on neurology assessment in the ED tpa given?: no, symptoms resolved prior to hospital arrival IR Thrombectomy? No, patient without persistent neurologic deficits concerning for LVO Modified Rankin Scale: 0-Completely asymptomatic and back to baseline post- stroke  ROS: A complete ROS was performed and is negative except as noted in the HPI.   Past  Medical History:  Diagnosis Date   Arthritis    Bowel obstruction (HCC)    CKD (chronic kidney disease)    unkknown stage per patient   Diabetes mellitus ([place])    Hyperlipidemia    Hypertension    Kidney stones    Past Surgical History:  Procedure Laterality Date   COLONOSCOPY  10/2015   Dr Kenton Kingfisher   COLOSTOMY REVISION Right 09/29/2017   Procedure: COLON RESECTION RIGHT;  Surgeon: Jovita Kussmaul, MD;  Location: [facility];  Service: General;  Laterality: Right;   ELBOW SURGERY     ulnar nerve repair   ESOPHAGOGASTRODUODENOSCOPY     same time    FOOT SURGERY     crushed heel   LAPAROTOMY N/A 09/29/2017   Procedure: EXPLORATORY LAPAROTOMY;  Surgeon: Jovita Kussmaul, MD;  Location: [facility];  Service: General;  Laterality: N/A;   Family History  Problem Relation Age of Onset   Kidney Stones Mother    Heart disease Mother    Colon cancer Neg Hx    Esophageal cancer Neg Hx    Social History:   reports that he has quit smoking. He has never used smokeless tobacco. He reports current alcohol use. He reports that he does not use drugs.  Medications  Current Facility-Administered Medications:    acetaminophen (TYLENOL) tablet 650 mg, 650 mg, Oral, Q4H PRN **OR** acetaminophen (TYLENOL) 160 MG/5ML solution 650 mg, 650 mg, Per Tube, Q4H PRN **OR** acetaminophen (TYLENOL) suppository 650 mg, 650 mg, Rectal, Q4H PRN, Virl Axe, MD   aspirin chewable tablet 81 mg, 81 mg, Oral,  Burt Ek, MD   atorvastatin (LIPITOR) tablet 80 mg, 80 mg, Oral, QHS, Virl Axe, MD   clopidogrel (PLAVIX) tablet 75 mg, 75 mg, Oral, Daily, Virl Axe, MD   enoxaparin (LOVENOX) injection 30 mg, 30 mg, Subcutaneous, Q24H, Allyson Sabal, Sagar, MD   insulin aspart (novoLOG) injection 0-15 Units, 0-15 Units, Subcutaneous, TID WC, Virl Axe, MD   insulin aspart (novoLOG) injection 0-5 Units, 0-5 Units, Subcutaneous, QHS, Jinwala, Sagar, MD   lactated ringers bolus 500 mL, 500 mL, Intravenous,  Once, Godfrey Pick, MD   pantoprazole (PROTONIX) EC tablet 40 mg, 40 mg, Oral, Daily, Virl Axe, MD  Current Outpatient Medications:    amLODipine (NORVASC) 5 MG tablet, Take 5 mg by mouth daily., Disp: , Rfl:    atorvastatin (LIPITOR) 80 MG tablet, Take 80 mg by mouth at bedtime. , Disp: , Rfl:    cetirizine (ZYRTEC) 10 MG tablet, Take 10 mg by mouth daily as needed. , Disp: , Rfl:    Cholecalciferol (VITAMIN D3) 50 MCG (2000 UT) TABS, Take 1 tablet by mouth daily., Disp: , Rfl:    clopidogrel (PLAVIX) 75 MG tablet, Take 75 mg by mouth every morning., Disp: , Rfl:    cyclobenzaprine (FLEXERIL) 10 MG tablet, Take 10 mg by mouth as needed (muscle pain). , Disp: , Rfl:    gabapentin (NEURONTIN) 300 MG capsule, Take 300 mg by mouth at bedtime., Disp: , Rfl:    Glucosamine-Chondroitin (COSAMIN DS PO), Take 1 capsule by mouth at bedtime., Disp: , Rfl:    LANTUS SOLOSTAR 100 UNIT/ML Solostar Pen, Inject 45 Units into the skin every morning. , Disp: , Rfl:    losartan (COZAAR) 100 MG tablet, Take 100 mg by mouth in the morning. , Disp: , Rfl:    omeprazole (PRILOSEC) 40 MG capsule, Take 40 mg by mouth every morning., Disp: , Rfl:    potassium citrate (UROCIT-K) 5 MEQ (540 MG) SR tablet, Take 15 mEq by mouth every morning., Disp: , Rfl:    propranolol (INDERAL) 20 MG tablet, Take 40-60 mg by mouth every morning., Disp: , Rfl:    tiZANidine (ZANAFLEX) 4 MG tablet, Take 4 mg by mouth every 6 (six) hours as needed for muscle spasms., Disp: , Rfl:    ASPIRIN LOW DOSE 81 MG EC tablet, Take 81 mg by mouth at bedtime. , Disp: , Rfl:    COVID-19 mRNA vaccine, Pfizer, 30 MCG/0.3ML injection, INJECT AS DIRECTED, Disp: .3 mL, Rfl: 0   ferrous sulfate 325 (65 FE) MG tablet, Take 325 mg by mouth as directed. On Monday, Wednesday and Friday at bedtime, Disp: , Rfl:    Polyethylene Glycol 3350 (MIRALAX PO), Take by mouth at bedtime., Disp: , Rfl:   Exam: Current vital signs: BP (!) 173/78 (BP Location: Right  Arm)   Pulse (!) 104   Temp 97.9 F (36.6 C) (Oral)   Resp 16   Ht _0  (1.753 m)   Wt 83.9 kg   SpO2 98%   BMI 27.32 kg/m  Vital signs in last 24 hours: Temp:  [97.9 F (36.6 C)] 97.9 F (36.6 C) (08/13 1048) Pulse Rate:  [104] 104 (08/13 1048) Resp:  [16] 16 (08/13 1048) BP: (173)/(78) 173/78 (08/13 1048) SpO2:  [98 %] 98 % (08/13 1048) Weight:  [83.9 kg] 83.9 kg (08/13 1048)  GENERAL: Awake, alert, in no acute distress Psych: Affect appropriate for situation, patient is calm and cooperative with examination Head: Normocephalic and atraumatic, without obvious abnormality EENT: Normal  conjunctivae, dry mucous membranes, no OP obstruction LUNGS: Normal respiratory effort. Non-labored breathing on room air CV: Tachycardia on telemetry, no pedal edema noted ABDOMEN: Soft, non-tender, non-distended Extremities: warm, well perfused, without obvious deformity  NEURO:  Mental Status: Awake, alert, and oriented to person, place, time, and situation. He is able to provide a clear and coherent history of present illness. Speech/Language: speech is intact without dysarthria.   Naming, repetition, fluency, and comprehension intact without aphasia  No neglect is noted Cranial Nerves:  II: PERRL 3 mm/brisk. Visual fields full.  III, IV, VI: EOMI without ptosis  V: Sensation is intact to light touch and symmetrical to face.   VII: Face is symmetric resting and smiling.  VIII: Hearing is intact to voice IX, X: Palate elevation is symmetric. Phonation normal.  XI: Normal sternocleidomastoid and trapezius muscle strength XII: Tongue protrudes midline without fasciculations.   Motor: 5/5 strength is all muscle groups without vertical drift on assessment.  Tone is normal. Bulk is normal.  Sensation: Intact to light touch bilaterally in all four extremities. No extinction to DSS present.  Coordination: FTN intact bilaterally. HKS intact bilaterally. No pronator drift. DTRs: 2+ and  symmetric biceps and patellae  Plantars: Toes downgoing bilaterally Gait: Deferred  NIHSS: 1a Level of Conscious.: 0 1b LOC Questions: 0 1c LOC Commands: 0 2 Best Gaze: 0 3 Visual: 0 4 Facial Palsy: 0 5a Motor Arm - left: 0 5b Motor Arm - Right: 0 6a Motor Leg - Left: 0 6b Motor Leg - Right: 0 7 Limb Ataxia: 0 8 Sensory: 0 9 Best Language: 0 10 Dysarthria: 0 11 Extinct. and Inatten.: 0 TOTAL: 0  Labs I have reviewed labs in epic and the results pertinent to this consultation are: CBC    Component Value Date/Time   WBC 6.4 05/01/2021 1057   RBC 4.10 (L) 05/01/2021 1057   HGB 12.6 (L) 05/01/2021 1057   HCT 40.0 05/01/2021 1057   PLT 91 (L) 05/01/2021 1057   MCV 97.6 05/01/2021 1057   MCH 30.7 05/01/2021 1057   MCHC 31.5 05/01/2021 1057   RDW 14.3 05/01/2021 1057   LYMPHSABS 1.6 09/28/2017 0811   MONOABS 0.5 09/28/2017 0811   EOSABS 0.2 09/28/2017 0811   BASOSABS 0.0 09/28/2017 0811   CMP     Component Value Date/Time   NA 137 05/01/2021 1057   NA 142 09/09/2014 0919   K 4.2 05/01/2021 1057   CL 109 05/01/2021 1057   CO2 22 05/01/2021 1057   GLUCOSE 149 (H) 05/01/2021 1057   BUN 26 (H) 05/01/2021 1057   BUN 33 (H) 09/09/2014 0919   CREATININE 2.41 (H) 05/01/2021 1057   CALCIUM 8.7 (L) 05/01/2021 1057   PROT 6.1 (L) 05/01/2021 1057   ALBUMIN 3.2 (L) 05/01/2021 1057   AST 20 05/01/2021 1057   ALT 26 05/01/2021 1057   ALKPHOS 49 05/01/2021 1057   BILITOT 0.8 05/01/2021 1057   GFRNONAA 28 (L) 05/01/2021 1057   GFRAA 37 (L) 10/05/2017 0916   Lipid Panel  No results found for: CHOL, TRIG, HDL, CHOLHDL, VLDL, LDLCALC, LDLDIRECT  Lab Results  Component Value Date   HGBA1C 6.6 (H) 05/01/2021   Imaging I have reviewed the images obtained:  CT-scan of the brain 05/01/2021: 1. No acute findings. No intracranial mass, hemorrhage or edema. 2. Mild chronic small vessel ischemic changes in the white matter  MRI examination of the brain 05/01/2021: No acute  brain finding. Few old small vessel cerebellar infarctions and  minimal small vessel ischemic change of the cerebral hemispheric white matter. Small amount of fluid in the mastoid air cells on the right. I would expect that this is subclinical.  Assessment: 75 y.o. male who presented for evaluation of a transient episode of dizziness, leftward leaning gait, speech disturbance, and left facial droop at home. Patient with history of TIAs in the past but none with deficits as significant as this morning.  - Examination reveals patient back to baseline functioning without residual deficits with an NIHSS of 0. - Stroke risk factors include advanced age, history of TIAs, HTN, HLD, CKD, and type 2 DM. - CTH and MRI brain are without evidence of acute infarction but MRI reveals few old small vessel cerebellar infarctions and minimal small vessel ischemic change of the cerebral hemispheric white matter.  - Presentation most consistent with a TIA with transient neurologic deficits. Will complete full stroke work up for further evaluation and risk stratification. It is possible that he had a vestibular issue contributing to his dizziness and gait disturbance but would not expect facial droop or speech disturbance with vestibular issue.   -Also found to be COVID-positive-does not seem to be symptomatic but could have some bearing on the stroke due to hypercoagulability seen with COVID.  Impression: -Transient neurologic deficits concerning for TIA- leftward leaning gait, left facial droop, speech disturbance -COVID-19 positive  Recommendations: - Admit for observation and completion of stroke work up  - Stroke labs: fasting lipid panel - MRA of the head without contrast due to eGFR of 28 with known CKD - Carotid dopplers - Allow for permissive hypertension, treat systolic blood pressure > 220  - Frequent neuro checks - Echocardiogram - Prophylactic therapy- Antiplatelet med: DAPT, continue home clopidogrel  75 mg PO daily and add ASA 81 mg PO daily for 3 weeks  (may need 3 months if MRA head shows intracranial atherosclerotic disease) - Risk factor modification - Telemetry monitoring - Stroke team to follow  Pt seen by NP/Neuro and later by MD. Note/plan to be edited by MD as needed.  Anibal Henderson, AGAC-NP [facility] Pager: [phone]   Attending Neurohospitalist Addendum Patient seen and examined with APP/Resident. Agree with the history and physical as documented above. Agree with the plan as documented, which I helped formulate. I have independently reviewed the chart, obtained history, review of systems and examined the patient.I have personally reviewed pertinent head/neck/spine imaging (CT/MRI). Please feel free to call with any questions.  -- Amie Portland, MD Neurologist [facility] Pager: [phone]

## 2021-05-01 NOTE — ED Notes (Signed)
Pt assisted to cammode

## 2021-05-01 NOTE — ED Provider Notes (Signed)
[facility] EMERGENCY DEPARTMENT Provider Note   CSN: [id number] Arrival date & time: 05/01/21  1048     History Chief Complaint  Patient presents with   Dizziness    Alex Mccarty is a 75 y.o. male.  HPI Patient is a 75 year old male who presents for strokelike symptoms this morning.  Patient states that he has had some fatigue and chills over the past 2 days.  This morning, when he woke up, he had severe dizziness, slurred speech, loss of balance, and what felt to him like left-sided weakness.  He states that the onset of the symptoms was at 7:30 AM.  He states that most of them cleared up by 8:30 AM.  Prior to arrival, patient's wife still felt like he was having slurred speech and called EMS.  Patient reports that he had complete resolution of his symptoms 5 minutes before EMS got there.  He currently states that he is not experiencing any neurologic symptoms.  He has no history of stroke but has not been evaluated for TIA in the past.  Per chart review, last TIA work-up was 6 years ago.  Patient states that he does not take a daily aspirin but does take Plavix.    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   OSA (obstructive sleep apnea) 08/08/2020   Snores 08/05/2020   Sleep apnea 08/03/2020   Severe nonproliferative diabetic retinopathy of left eye, with macular edema, associated with type 2 diabetes mellitus ([place]) 08/03/2020   Severe nonproliferative diabetic retinopathy of right eye, with macular edema, associated with type 2 diabetes mellitus ([place]) 08/03/2020   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 10/05/2017   SBO (small  bowel obstruction) ([place]) 09/28/2017   HTN (hypertension) 09/28/2017   HLD (hyperlipidemia) 09/28/2017   Type 2 diabetes mellitus with nephropathy ([place]) 09/28/2017   CKD stage 3 secondary to diabetes ([place]) 09/28/2017   AKI (acute kidney injury) ([place]) 09/28/2017   GERD (gastroesophageal reflux disease) 09/28/2017   Vision, loss, sudden 09/23/2014   Amaurosis fugax of right eye 09/09/2014   Benign paroxysmal positional vertigo 09/09/2014   Headache 09/09/2014   Vertigo 09/09/2014    Past Surgical History:  Procedure Laterality Date   COLONOSCOPY  10/2015   Dr Kenton Kingfisher   COLOSTOMY REVISION Right 09/29/2017   Procedure: COLON RESECTION RIGHT;  Surgeon: Jovita Kussmaul, MD;  Location: [facility];  Service: General;  Laterality: Right;   ELBOW SURGERY     ulnar nerve repair   ESOPHAGOGASTRODUODENOSCOPY     same time    FOOT SURGERY     crushed heel   LAPAROTOMY N/A 09/29/2017   Procedure: EXPLORATORY LAPAROTOMY;  Surgeon: Jovita Kussmaul, MD;  Location: [facility];  Service: General;  Laterality: N/A;       Family History  Problem Relation Age of Onset   Kidney Stones Mother    Heart disease Mother    Colon cancer Neg Hx    Esophageal cancer Neg Hx     Social History   Tobacco Use   Smoking status: Former   Smokeless tobacco:  Never   Tobacco comments:    quit over 40 years ago.Around age [age]   Vaping Use   Vaping Use: Never used  Substance Use Topics   Alcohol use: Yes    Alcohol/week: 0.0 standard drinks    Comment: occasional   Drug use: No    Home Medications Prior to Admission medications   Medication Sig Start Date End Date Taking? Authorizing Provider  amLODipine (NORVASC) 5 MG tablet Take 2.5 mg by mouth daily. 01/14/21  Yes [provider]  atorvastatin (LIPITOR) 80 MG tablet Take 80 mg by mouth at bedtime.  02/23/14  Yes [provider]  cetirizine (ZYRTEC) 10 MG tablet Take 10 mg by mouth daily as needed for allergies.   Yes [provider]  Cholecalciferol (VITAMIN D3) 50 MCG (2000 UT) TABS Take 1 tablet by mouth daily.   Yes [provider]  clopidogrel (PLAVIX) 75 MG tablet Take 75 mg by mouth every morning.   Yes [provider]  cyclobenzaprine (FLEXERIL) 10 MG tablet Take 10 mg by mouth as needed (muscle pain).    Yes [provider]  gabapentin (NEURONTIN) 300 MG capsule Take 300 mg by mouth at bedtime.   Yes [provider]  Glucosamine-Chondroitin (COSAMIN DS PO) Take 1 capsule by mouth at bedtime.   Yes [provider]  LANTUS SOLOSTAR 100 UNIT/ML Solostar Pen Inject 45 Units into the skin every morning.  06/02/14  Yes [provider]  losartan (COZAAR) 100 MG tablet Take 100 mg by mouth every evening. 08/08/14  Yes [provider]  omeprazole (PRILOSEC) 40 MG capsule Take 40 mg by mouth every morning.   Yes [provider]  potassium citrate (UROCIT-K) 5 MEQ (540 MG) SR tablet Take 10 mEq by mouth every morning.   Yes [provider]  propranolol (INDERAL) 20 MG tablet Take 60 mg by mouth every morning.   Yes [provider]  tiZANidine (ZANAFLEX) 4 MG tablet Take 4 mg by mouth every 6 (six) hours as needed for muscle spasms.   Yes [provider]  ASPIRIN LOW DOSE 81 MG EC tablet Take 81 mg by mouth at bedtime.  08/28/14   [provider]  COVID-19 mRNA vaccine, [place], 30 MCG/0.3ML injection INJECT AS DIRECTED 08/17/20 08/17/21  Carlyle Basques, MD    Allergies    Amlodipine besylate, Codeine, Penicillins, and Sulfa antibiotics  Review of Systems   Review of Systems  Constitutional:  Positive for chills and fatigue. Negative for fever.  HENT:  Negative for ear pain and sore throat.   Eyes:  Negative for pain and visual disturbance.  Respiratory:  Positive for cough. Negative for chest tightness, shortness of breath and wheezing.   Cardiovascular:  Negative for chest pain and palpitations.   Gastrointestinal:  Negative for abdominal pain, diarrhea, nausea and vomiting.  Genitourinary:  Negative for dysuria and hematuria.  Musculoskeletal:  Negative for arthralgias, back pain, myalgias and neck pain.  Skin:  Negative for color change and rash.  Neurological:  Positive for dizziness, speech difficulty, weakness and headaches. Negative for seizures, syncope and numbness.  Hematological:  Does not bruise/bleed easily.  Psychiatric/Behavioral:  Negative for confusion and decreased concentration.   All other systems reviewed and are negative.  Physical Exam Updated Vital Signs BP (!) 173/78 (BP Location: Right Arm)   Pulse (!) 104   Temp 97.9 F (36.6 C) (Oral)   Resp 16   Ht 5\' 9"  (1.753 m)   Wt  83.9 kg   SpO2 98%   BMI 27.32 kg/m   Physical Exam Vitals and nursing note reviewed.  Constitutional:      General: He is not in acute distress.    Appearance: Normal appearance. He is well-developed. He is not ill-appearing, toxic-appearing or diaphoretic.  HENT:     Head: Normocephalic and atraumatic.     Right Ear: External ear normal.     Left Ear: External ear normal.     Nose: Congestion present. No rhinorrhea.     Mouth/Throat:     Mouth: Mucous membranes are moist.     Pharynx: Oropharynx is clear.  Eyes:     General: No visual field deficit.    Extraocular Movements: Extraocular movements intact.     Conjunctiva/sclera: Conjunctivae normal.  Cardiovascular:     Rate and Rhythm: Normal rate and regular rhythm.     Heart sounds: No murmur heard. Pulmonary:     Effort: Pulmonary effort is normal. No respiratory distress.     Breath sounds: Normal breath sounds. No wheezing or rales.  Abdominal:     Palpations: Abdomen is soft.     Tenderness: There is no abdominal tenderness. There is no guarding.  Musculoskeletal:     Cervical back: Neck supple. No rigidity.     Right lower leg: No edema.     Left lower leg: No edema.  Skin:    General: Skin is warm and  dry.  Neurological:     General: No focal deficit present.     Mental Status: He is alert and oriented to person, place, and time.     Cranial Nerves: Cranial nerves are intact. No cranial nerve deficit, dysarthria or facial asymmetry.     Sensory: Sensation is intact. No sensory deficit.     Motor: Tremor (Has been told he has essential tremor baseline) present. No weakness, abnormal muscle tone or pronator drift.     Coordination: Coordination is intact. Finger-Nose-Finger Test normal.    ED Results / Procedures / Treatments   Labs (all labs ordered are listed, but only abnormal results are displayed) Labs Reviewed  RESP PANEL BY RT-PCR (FLU A&B, COVID) ARPGX2 - Abnormal; Notable for the following components:      Result Value   SARS Coronavirus 2 by RT PCR POSITIVE (*)    All other components within normal limits  CBC - Abnormal; Notable for the following components:   RBC 4.10 (*)    Hemoglobin 12.6 (*)    Platelets 91 (*)    All other components within normal limits  COMPREHENSIVE METABOLIC PANEL - Abnormal; Notable for the following components:   Glucose, Bld 149 (*)    BUN 26 (*)    Creatinine, Ser 2.41 (*)    Calcium 8.7 (*)    Total Protein 6.1 (*)    Albumin 3.2 (*)    GFR, Estimated 28 (*)    All other components within normal limits  HEMOGLOBIN A1C - Abnormal; Notable for the following components:   Hgb A1c MFr Bld 6.6 (*)    All other components within normal limits  CBG MONITORING, ED - Abnormal; Notable for the following components:   Glucose-Capillary 112 (*)    All other components within normal limits  PROTIME-INR  APTT  RAPID URINE DRUG SCREEN, HOSP PERFORMED  URINALYSIS, ROUTINE W REFLEX MICROSCOPIC    EKG None  Radiology CT HEAD WO CONTRAST  Result Date: 05/01/2021 CLINICAL DATA:  Dizziness, vertigo since last night. EXAM:  CT HEAD WITHOUT CONTRAST TECHNIQUE: Contiguous axial images were obtained from the base of the skull through the vertex  without intravenous contrast. COMPARISON:  None. FINDINGS: Brain: Mild generalized age related parenchymal volume loss with commensurate dilatation of the ventricles and sulci. Minimal chronic small vessel ischemic changes within the deep periventricular white matter regions bilaterally. No mass, hemorrhage, edema or other evidence of acute parenchymal abnormality. No extra-axial hemorrhage. Vascular: Chronic calcified atherosclerotic changes of the large vessels at the skull base. No unexpected hyperdense vessel. Skull: Normal. Negative for fracture or focal lesion. Sinuses/Orbits: No acute finding. Other: None. IMPRESSION: 1. No acute findings. No intracranial mass, hemorrhage or edema. 2. Mild chronic small vessel ischemic changes in the white matter. Electronically Signed   By: Franki Cabot M.D.   On: 05/01/2021 11:59   MR ANGIO HEAD WO CONTRAST  Result Date: 05/01/2021 CLINICAL DATA:  Transient ischemic attack. Memory loss. Mental status change, unknown cause. EXAM: MRA HEAD WITHOUT CONTRAST TECHNIQUE: Angiographic images of the Circle of Willis were acquired using MRA technique without intravenous contrast. COMPARISON:  MRI head same day. FINDINGS: Anterior circulation: Both internal carotid arteries are widely patent through the skull base and siphon regions. The anterior and middle cerebral vessels are patent without proximal stenosis, aneurysm or vascular malformation. No identifiable branch vessel occlusion. Posterior circulation: Both vertebral arteries are widely patent to the basilar. No basilar stenosis. Posterior circulation branch vessels are normal. Right PCA takes fetal origin from the anterior circulation. Anatomic variants: None significant otherwise. Other: None. IMPRESSION: Normal MR angiography of the large and medium sized vessels. Electronically Signed   By: Nelson Chimes M.D.   On: 05/01/2021 15:12   MR BRAIN WO CONTRAST  Result Date: 05/01/2021 CLINICAL DATA:  Transient ischemic  attack. Memory loss. Mental status change, unknown cause. EXAM: MRI HEAD WITHOUT CONTRAST TECHNIQUE: Multiplanar, multiecho pulse sequences of the brain and surrounding structures were obtained without intravenous contrast. COMPARISON:  Head CT same day. FINDINGS: Brain: Diffusion imaging does not show any acute or subacute infarction. The brainstem is normal. Few old small vessel cerebellar infarctions. Minimal small vessel change of the cerebral hemispheric white matter. No cortical or large vessel territory infarction. No mass lesion, hemorrhage, hydrocephalus or extra-axial collection. Vascular: Major vessels at the base of the brain show flow. Skull and upper cervical spine: Negative Sinuses/Orbits: Paranasal sinuses are clear.  Orbits are normal. Other: Small amount of fluid in the mastoid air cells on the right. No fluid in the middle ear. IMPRESSION: No acute brain finding. Few old small vessel cerebellar infarctions and minimal small vessel ischemic change of the cerebral hemispheric white matter. Small amount of fluid in the mastoid air cells on the right. I would expect that this is subclinical. Electronically Signed   By: Nelson Chimes M.D.   On: 05/01/2021 12:32   DG Chest Portable 1 View  Result Date: 05/01/2021 CLINICAL DATA:  Cough, dizziness EXAM: PORTABLE CHEST 1 VIEW COMPARISON:  Chest x-ray dated 03/04/2019 FINDINGS: The heart size and mediastinal contours are within normal limits. Both lungs are clear. The visualized skeletal structures are unremarkable. IMPRESSION: No active disease.  No evidence of pneumonia or pulmonary edema. Electronically Signed   By: Franki Cabot M.D.   On: 05/01/2021 11:56    Procedures Procedures   Medications Ordered in ED Medications  acetaminophen (TYLENOL) tablet 650 mg (has no administration in time range)    Or  acetaminophen (TYLENOL) 160 MG/5ML solution 650 mg (has no administration in  time range)    Or  acetaminophen (TYLENOL) suppository 650 mg  (has no administration in time range)  enoxaparin (LOVENOX) injection 30 mg (30 mg Subcutaneous Given 05/01/21 1630)  atorvastatin (LIPITOR) tablet 80 mg (has no administration in time range)  clopidogrel (PLAVIX) tablet 75 mg (75 mg Oral Given 05/01/21 1618)  aspirin chewable tablet 81 mg (has no administration in time range)  pantoprazole (PROTONIX) EC tablet 40 mg (40 mg Oral Given 05/01/21 1618)  insulin aspart (novoLOG) injection 0-15 Units (has no administration in time range)  insulin aspart (novoLOG) injection 0-5 Units (has no administration in time range)  lactated ringers bolus 500 mL (500 mLs Intravenous New Bag/Given 05/01/21 1618)    ED Course  I have reviewed the triage vital signs and the nursing notes.  Pertinent labs & imaging results that were available during my care of the patient were reviewed by me and considered in my medical decision making (see chart for details).    MDM Rules/Calculators/A&P                           Patient is a 75 year old male who presents for strokelike symptoms.  Onset of symptoms was upon awakening at 7:30 AM.  They mostly resolved by 8:30 AM.  Prior to arrival, patient had complete resolution of symptoms.  Currently, patient denies any neurologic symptoms.  He has no focal neurologic deficits on exam.  Presentation is concerning for TIA.  TIA imaging ordered.  Additionally, patient has had 2 days of chills and fatigue.  Infectious work-up initiated as well.  Neurology was consulted and performed brief review of MRI results.  Given his prolonged time since his last TIA work-up, patient would benefit from admission for further testing.  Patient was admitted to hospitalist.  Found to be COVID-positive.  Final Clinical Impression(s) / ED Diagnoses Final diagnoses:  Dizziness    Rx / DC Orders ED Discharge Orders     None        Godfrey Pick, MD 05/01/21 1636

## 2021-05-02 ENCOUNTER — Observation Stay (HOSPITAL_BASED_OUTPATIENT_CLINIC_OR_DEPARTMENT_OTHER): Payer: No Typology Code available for payment source

## 2021-05-02 DIAGNOSIS — G459 Transient cerebral ischemic attack, unspecified: Secondary | ICD-10-CM | POA: Diagnosis not present

## 2021-05-02 DIAGNOSIS — R479 Unspecified speech disturbances: Secondary | ICD-10-CM | POA: Diagnosis not present

## 2021-05-02 DIAGNOSIS — R42 Dizziness and giddiness: Secondary | ICD-10-CM

## 2021-05-02 DIAGNOSIS — U071 COVID-19: Secondary | ICD-10-CM

## 2021-05-02 DIAGNOSIS — R269 Unspecified abnormalities of gait and mobility: Secondary | ICD-10-CM

## 2021-05-02 LAB — ECHOCARDIOGRAM LIMITED
Area-P 1/2: 2.99 cm2
Height: 69 in
S' Lateral: 3.1 cm
Weight: 2960 oz

## 2021-05-02 LAB — CBG MONITORING, ED
Glucose-Capillary: 125 mg/dL — ABNORMAL HIGH (ref 70–99)
Glucose-Capillary: 129 mg/dL — ABNORMAL HIGH (ref 70–99)

## 2021-05-02 LAB — LIPID PANEL
Cholesterol: 97 mg/dL (ref 0–200)
HDL: 25 mg/dL — ABNORMAL LOW (ref >40)
LDL Cholesterol: 55 mg/dL (ref 0–99)
Total CHOL/HDL Ratio: 3.9 RATIO
Triglycerides: 84 mg/dL (ref <150)
VLDL: 17 mg/dL (ref 0–40)

## 2021-05-02 LAB — COMPREHENSIVE METABOLIC PANEL
ALT: 22 U/L (ref 0–44)
AST: 17 U/L (ref 15–41)
Albumin: 2.6 g/dL — ABNORMAL LOW (ref 3.5–5.0)
Alkaline Phosphatase: 47 U/L (ref 38–126)
Anion gap: 7 (ref 5–15)
BUN: 25 mg/dL — ABNORMAL HIGH (ref 8–23)
CO2: 24 mmol/L (ref 22–32)
Calcium: 8.7 mg/dL — ABNORMAL LOW (ref 8.9–10.3)
Chloride: 106 mmol/L (ref 98–111)
Creatinine, Ser: 2.38 mg/dL — ABNORMAL HIGH (ref 0.61–1.24)
GFR, Estimated: 28 mL/min — ABNORMAL LOW (ref >60)
Glucose, Bld: 126 mg/dL — ABNORMAL HIGH (ref 70–99)
Potassium: 4.3 mmol/L (ref 3.5–5.1)
Sodium: 137 mmol/L (ref 135–145)
Total Bilirubin: 0.6 mg/dL (ref 0.3–1.2)
Total Protein: 5.4 g/dL — ABNORMAL LOW (ref 6.5–8.1)

## 2021-05-02 LAB — CBC
HCT: 36.3 % — ABNORMAL LOW (ref 39.0–52.0)
Hemoglobin: 11.5 g/dL — ABNORMAL LOW (ref 13.0–17.0)
MCH: 31.1 pg (ref 26.0–34.0)
MCHC: 31.7 g/dL (ref 30.0–36.0)
MCV: 98.1 fL (ref 80.0–100.0)
Platelets: 81 K/uL — ABNORMAL LOW (ref 150–400)
RBC: 3.7 MIL/uL — ABNORMAL LOW (ref 4.22–5.81)
RDW: 14.5 % (ref 11.5–15.5)
WBC: 4.6 K/uL (ref 4.0–10.5)
nRBC: 0 % (ref 0.0–0.2)

## 2021-05-02 LAB — GLUCOSE, CAPILLARY
Glucose-Capillary: 166 mg/dL — ABNORMAL HIGH (ref 70–99)
Glucose-Capillary: 124 mg/dL — ABNORMAL HIGH (ref 70–99)

## 2021-05-02 MED ORDER — SENNOSIDES-DOCUSATE SODIUM 8.6-50 MG PO TABS
1.0000 | ORAL_TABLET | Freq: Every day | ORAL | Status: DC
  Filled 2021-05-02: qty 1

## 2021-05-02 MED ORDER — POLYETHYLENE GLYCOL 3350 17 G PO PACK
17.0000 g | PACK | Freq: Every day | ORAL | Status: DC
  Administered 2021-05-02: 17 g via ORAL
  Filled 2021-05-02: qty 1

## 2021-05-02 MED ORDER — LACTATED RINGERS IV BOLUS
500.0000 mL | Freq: Once | INTRAVENOUS | Status: AC
  Administered 2021-05-02: 500 mL via INTRAVENOUS

## 2021-05-02 MED ORDER — PROPRANOLOL HCL 40 MG PO TABS
40.0000 mg | ORAL_TABLET | ORAL | Status: DC
  Administered 2021-05-02: 40 mg via ORAL
  Filled 2021-05-02 (×2): qty 1

## 2021-05-02 NOTE — Progress Notes (Addendum)
STROKE TEAM PROGRESS NOTE   INTERVAL HISTORY His wife is at the bedside.  He offers no complaints today. Appears intact.  Vitals:   05/02/21 1245 05/02/21 1300 05/02/21 1454 05/02/21 1627  BP: 138/81 139/78 (!) 154/72 (!) 149/73  Pulse: 78 72 70 64  Resp: 17 (!) 21 18 20   Temp:   98.1 F (36.7 C) 98.1 F (36.7 C)  TempSrc:   Oral Oral  SpO2: 96% 94% 94% 94%  Weight:      Height:       CBC:  Recent Labs  Lab 05/01/21 1057 05/02/21 0341  WBC 6.4 4.6  HGB 12.6* 11.5*  HCT 40.0 36.3*  MCV 97.6 98.1  PLT 91* 81*   Basic Metabolic Panel:  Recent Labs  Lab 05/01/21 1057 05/02/21 0341  NA 137 137  K 4.2 4.3  CL 109 106  CO2 22 24  GLUCOSE 149* 126*  BUN 26* 25*  CREATININE 2.41* 2.38*  CALCIUM 8.7* 8.7*    Lipid Panel: No results for input(s): CHOL, TRIG, HDL, CHOLHDL, VLDL, LDLCALC in the last 168 hours.  HgbA1c:  Recent Labs  Lab 05/01/21 1057  HGBA1C 6.6*   Urine Drug Screen:  Recent Labs  Lab 05/01/21 1800  LABOPIA NONE DETECTED  COCAINSCRNUR NONE DETECTED  LABBENZ NONE DETECTED  AMPHETMU NONE DETECTED  THCU NONE DETECTED  LABBARB NONE DETECTED    Alcohol Level No results for input(s): ETH in the last 168 hours.  IMAGING past 24 hours VAS US CAROTID  Result Date: 05/02/2021 Carotid Arterial Duplex Study Patient Name:  Alex Mccarty  Date of Exam:   05/02/2021 Medical Rec #: [id number]         Accession #:    [id number] Date of Birth: 1945/11/30        Patient Gender: M Patient Age:   6 years Exam Location:  [facility] Procedure:      VAS US CAROTID Referring Phys: Anibal Henderson --------------------------------------------------------------------------------  Indications:       TIA, Speech disturbance and Dizziness, gait disturbance,                    facial droop. Risk Factors:      Hypertension, hyperlipidemia, past history of smoking. Other Factors:     Current Covid 19 infection. Comparison Study:  No prior study on file Performing  Technologist: Sharion Dove RVS  Examination Guidelines: A complete evaluation includes B-mode imaging, spectral Doppler, color Doppler, and power Doppler as needed of all accessible portions of each vessel. Bilateral testing is considered an integral part of a complete examination. Limited examinations for reoccurring indications may be performed as noted.  Right Carotid Findings: +----------+--------+--------+--------+------------------+------------------+           PSV cm/sEDV cm/sStenosisPlaque DescriptionComments           +----------+--------+--------+--------+------------------+------------------+ CCA Prox  88      17                                intimal thickening +----------+--------+--------+--------+------------------+------------------+ CCA Distal86      16                                intimal thickening +----------+--------+--------+--------+------------------+------------------+ ICA Prox  71      17              heterogenous                         +----------+--------+--------+--------+------------------+------------------+  ICA Distal85      24                                                   +----------+--------+--------+--------+------------------+------------------+ ECA       116     13              heterogenous                         +----------+--------+--------+--------+------------------+------------------+ +----------+--------+-------+--------+-------------------+           PSV cm/sEDV cmsDescribeArm Pressure (mmHG) +----------+--------+-------+--------+-------------------+ [id number]                                         +----------+--------+-------+--------+-------------------+ +---------+--------+--+--------+--+ VertebralPSV cm/s74EDV cm/s13 +---------+--------+--+--------+--+  Left Carotid Findings: +----------+--------+--------+--------+------------------+------------------+           PSV cm/sEDV  cm/sStenosisPlaque DescriptionComments           +----------+--------+--------+--------+------------------+------------------+ CCA Prox  111     19                                intimal thickening +----------+--------+--------+--------+------------------+------------------+ CCA Distal91      17                                intimal thickening +----------+--------+--------+--------+------------------+------------------+ ICA Prox  86      15              heterogenous                         +----------+--------+--------+--------+------------------+------------------+ ICA Distal78      20                                                   +----------+--------+--------+--------+------------------+------------------+ ECA       103     7               heterogenous                         +----------+--------+--------+--------+------------------+------------------+ +----------+--------+--------+--------+-------------------+           PSV cm/sEDV cm/sDescribeArm Pressure (mmHG) +----------+--------+--------+--------+-------------------+ [id number]                                         +----------+--------+--------+--------+-------------------+ +---------+--------+--+--------+--+ VertebralPSV cm/s69EDV cm/s12 +---------+--------+--+--------+--+   Summary: Right Carotid: Velocities in the right ICA are consistent with a 1-39% stenosis. Left Carotid: Velocities in the left ICA are consistent with a 1-39% stenosis. Vertebrals:  Bilateral vertebral arteries demonstrate antegrade flow. Subclavians: Normal flow hemodynamics were seen in bilateral subclavian              arteries. *See table(s) above for measurements and observations.  Electronically signed by Harold Barban MD on 05/02/2021 at 3:29:47 PM.    Final  ECHOCARDIOGRAM LIMITED  Result Date: 05/02/2021    ECHOCARDIOGRAM LIMITED REPORT   Patient Name:   Alex Mccarty Date of Exam: 05/02/2021 Medical Rec  #:  [id number]        Height:       69.0 in Accession #:    [id number]       Weight:       185.0 lb Date of Birth:  1946/08/06       BSA:          1.999 m Patient Age:    75 years         BP:           139/78 mmHg Patient Gender: M                HR:           69 bpm. Exam Location:  Inpatient Procedure: Limited Echo, Limited Color Doppler and Cardiac Doppler Indications:    stroke  History:        Patient has no prior history of Echocardiogram examinations.                 Chronic kidney disease; Risk Factors:Sleep Apnea, Dyslipidemia,                 Diabetes and Hypertension.  Sonographer:    Johny Chess RDCS Referring Phys: [id number] [place]  1. Left ventricular ejection fraction, by estimation, is 60 to 65%. The left ventricle has normal function. The left ventricle has no regional wall motion abnormalities. Left ventricular diastolic parameters are consistent with Grade I diastolic dysfunction (impaired relaxation).  2. The tricuspid valve is abnormal. Tricuspid valve regurgitation is mild.  3. The aortic valve is tricuspid. Aortic valve regurgitation is not visualized.  4. There is normal pulmonary artery systolic pressure.  5. The inferior vena cava is normal in size with greater than 50% respiratory variability, suggesting right atrial pressure of 3 mmHg. FINDINGS  Left Ventricle: Left ventricular ejection fraction, by estimation, is 60 to 65%. The left ventricle has normal function. The left ventricle has no regional wall motion abnormalities. There is no left ventricular hypertrophy. Left ventricular diastolic parameters are consistent with Grade I diastolic dysfunction (impaired relaxation). Indeterminate filling pressures. Right Ventricle: There is normal pulmonary artery systolic pressure. The tricuspid regurgitant velocity is 2.81 m/s, and with an assumed right atrial pressure of 3 mmHg, the estimated right ventricular systolic pressure is 24.5 mmHg. Left Atrium: Left atrial  size was normal in size. Right Atrium: Right atrial size was normal in size. Tricuspid Valve: The tricuspid valve is abnormal. Tricuspid valve regurgitation is mild. Aortic Valve: The aortic valve is tricuspid. Aortic valve regurgitation is not visualized. Venous: The inferior vena cava is normal in size with greater than 50% respiratory variability, suggesting right atrial pressure of 3 mmHg. IAS/Shunts: No atrial level shunt detected by color flow Doppler. LEFT VENTRICLE PLAX 2D LVIDd:         4.60 cm  Diastology LVIDs:         3.10 cm  LV e' medial:    6.96 cm/s LV PW:         0.80 cm  LV E/e' medial:  10.9 LV IVS:        0.80 cm  LV e' lateral:   8.81 cm/s LVOT diam:     1.80 cm  LV E/e' lateral: 8.6 LV SV:         48 LV  SV Index:   24 LVOT Area:     2.54 cm  IVC IVC diam: 1.60 cm LEFT ATRIUM         Index LA diam:    3.70 cm 1.85 cm/m  AORTIC VALVE LVOT Vmax:   92.00 cm/s LVOT Vmean:  59.500 cm/s LVOT VTI:    0.188 m MITRAL VALVE                TRICUSPID VALVE MV Area (PHT): 2.99 cm     TR Peak grad:   31.6 mmHg MV Decel Time: 254 msec     TR Vmax:        281.00 cm/s MV E velocity: 75.80 cm/s MV A velocity: 106.00 cm/s  SHUNTS MV E/A ratio:  0.72         Systemic VTI:  0.19 m                             Systemic Diam: 1.80 cm Lyman Bishop MD Electronically signed by Lyman Bishop MD Signature Date/Time: 05/02/2021/2:56:52 PM    Final     PHYSICAL EXAM  Mental Status: Awake, alert, and oriented to person, place, time, and situation. He is able to provide a clear and coherent history of present illness. Speech/Language: speech is intact without dysarthria.   Naming, repetition, fluency, and comprehension intact without aphasia  No neglect is noted Cranial Nerves:  II: PERRL 3 mm/brisk. Visual fields full.  III, IV, VI: EOMI without ptosis  V: Sensation is intact to light touch and symmetrical to face.   VII: Face is symmetric resting and smiling.  VIII: Hearing is intact to voice IX, X: Palate  elevation is symmetric. Phonation normal.  XI: Normal sternocleidomastoid and trapezius muscle strength XII: Tongue protrudes midline without fasciculations.   Motor: 5/5 strength is all muscle groups without vertical drift on assessment.  Tone is normal. Bulk is normal.  Sensation: Intact to light touch bilaterally in all four extremities. No extinction to DSS present.  Coordination: FTN intact bilaterally. HKS intact bilaterally. No pronator drift. DTRs: 2+ and symmetric biceps and patellae  Plantars: Toes downgoing   ASSESSMENT/PLAN Alex Mccarty is a 75 y.o. 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  Mr. Caggiano and his wife were out shopping yesterday afternoon around 4 PM when he became dizzy but he attributed this to his history of vertigo.  The dizziness progressed and he went to bed still dizzy at 22:00.  He woke at 04:00 needing to use the restroom and states that his dizziness had intensified and that he was " bouncing off of walls" when attempting to ambulate.  At 07:00, he was unable to ambulate requiring his wife's help.  She noted that when he was attempting to walk he was leaning towards the left and noted that his speech was slurred, garbled, and unintelligible.  She also noted that he had a left-sided facial droop and activated EMS.  Patient states that when EMS arrived at 09:30 he was "coming out of it" with resolution of dysarthria but still complained of dizziness.  On arrival to the hospital, his symptoms had resolved and neurology was consulted for further evaluation.[name]  TIA:      CT head: no acute findings MRI  Brain: No acute brain finding. Few old small vessel cerebellar infarctions and  minimal small vessel ischemic change of the cerebral hemispheric white matter.   Small amount of fluid in the mastoid air cells on the right. I would expect that this is subclinical.   MRA head: IMPRESSION: Normal MR angiography of the large and medium sized vessels. Carotid Doppler: RICA stenosis 0-53%, LICA stenosis 9-76%. Antegrade flow at bil vertebral arteries.  2D Echo LVEF 60-65%, LA size normal, no IA shunt.    LDL No results found for requested labs within last 26280 hours. HgbA1c 6.6 VTE prophylaxis -  SCD    Diet   Diet heart healthy/carb modified Room service appropriate? Yes; Fluid consistency: Thin   aspirin 81 mg daily and clopidogrel 75 mg daily prior to admission, now on aspirin 81 mg daily and clopidogrel 75 mg daily.   Therapy recommendations:  n/a Disposition:   n/a  Hypertension Home meds:  losartan, inderal Stable Permissive hypertension (OK if < 220/120) but gradually normalize in 5-7 days Long-term BP goal normotensive  Hyperlipidemia Home meds:  lipitor ,  resumed in hospital LDL No results found for requested labs within last 26280 hours., goal < 70   High intensity statin   Continue statin at discharge  Diabetes type II Controlled Home meds:   none HgbA1c 6.6, goal < 7.0 CBGs Recent Labs    05/02/21 0727 05/02/21 1245 05/02/21 1629  GLUCAP 125* 129* 166*    SSI  Other Stroke Risk Factors  Advanced Age >/= 93   Coronary artery disease    Hospital day # 0     To contact Stroke Continuity provider, please refer to [email or web address] After hours, contact General Neurology

## 2021-05-02 NOTE — Progress Notes (Signed)
Overnight: Alex Mccarty tested positive for COVID but denied any symptoms at that time.   Subjective: I seen and evaluated Alex Mccarty at bedside.  He states that he is feeling fine.  He reports dizziness upon standing when he went to the bathroom earlier today.  He reports some chest congestion and dry cough.  He denies headache, fever, chills, changes in vision, chest pain, palpitations, SOB, N/V, numbness or tingling of the extremities.  Last bowel movement 2 days ago.  But he admits he has not had much to eat since last night.  Objective:  Vital signs in last 24 hours: Vitals:   05/02/21 0730 05/02/21 0930 05/02/21 0945 05/02/21 1000  BP: (!) 146/71 139/80 (!) 142/72 (!) 144/83  Pulse: 78 82 76 75  Resp: 17 16 19 17   Temp:      TempSrc:      SpO2: 95% 94% 93% 92%  Weight:      Height:       Physical Exam Constitutional:      General: He is awake.     Appearance: Normal appearance.  HENT:     Head: Normocephalic and atraumatic.  Eyes:     Extraocular Movements: Extraocular movements intact.     Right eye: No nystagmus.     Left eye: No nystagmus.     Pupils: Pupils are equal, round, and reactive to light.  Neck:     Vascular: No carotid bruit.  Cardiovascular:     Rate and Rhythm: Normal rate.     Pulses:          Radial pulses are 2+ on the right side and 2+ on the left side.     Heart sounds: Normal heart sounds.  Pulmonary:     Effort: Pulmonary effort is normal.     Breath sounds: Examination of the right-upper field reveals wheezing. Examination of the right-middle field reveals rales. Examination of the left-middle field reveals rales. Examination of the right-lower field reveals rales. Examination of the left-lower field reveals rales. Wheezing and rales present.  Abdominal:     General: Bowel sounds are normal.     Palpations: Abdomen is soft.     Tenderness: There is no abdominal tenderness.  Musculoskeletal:     Right lower leg: No edema.     Left lower  leg: No edema.  Skin:    General: Skin is warm and dry.  Neurological:     Mental Status: He is alert and oriented to person, place, and time.     Cranial Nerves: No dysarthria or facial asymmetry.     Motor: Tremor present.     Coordination: Finger-Nose-Finger Test abnormal.     Comments: Proprioception intact  Psychiatric:        Speech: Speech normal.        Behavior: Behavior normal. Behavior is cooperative.     Assessment/Plan:  Active Problems:   Transient ischemic attack (TIA)  Alex Mccarty is a 75 year old male with a pertinent PMHx of BPPV, DM w/o complication with long-term current use of insulin, HLD, HTN, CKD stage III who presented to the ED with chief complaint of dizziness, slurred speech, difficulty walking, and urinary incontinence.  Transient ischemic attack Patient symptoms has since resolved except the dizziness. Patient does have a history of BPPV, which could possibly explain the positional dizziness he is still experiencing. 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 MRA of the head is negative. There was  some suspicion for urinary infection given episode of urinary incontinence and chills, UA neg for LA or nitrates, rare bacteria. UDS negative as well.   ---Lipid profile obtained July 2022- total cholesterol- 126; LDL- 67; HDL-30. No need to repeat. ---DAPT -Asprin 81mg  and Plavix 75mg ; continue for 3 weeks after discharge per neurology recommendation ---Carotid Doppler- pending results ---Echocardiogram- pending results ---DC Telemetry after 24hours ---PT consult for dizziness ---Neurology recommendations greatly appreciated.  COVID Positive Patient has tested positive for COVID.  Recent vitals revealed that he is currently afebrile.  Patient reports dry cough and chest congestion today.  Denies fever or chills.  Crackles heard at bilateral lung bases on auscultation.  Patient denies shortness of breath. --- Place patient  in airborne and contact precaution --- Monitor respiration and symptom progression, if any. Paxlovid not indicated at this time. --- Retest prior to discharge.  CKD stage III Alex Mccarty's current creatinine is 2.38.  His baseline ranges from 2-2.1 he reports. Potassium levels are within normal limits at 4.3. Patient received a total of 1L of fluids since yesterday. Patient reports voiding appropriately. ---Increase fluid intake by mouth ---Repeat BMP in the AM  HTN Current home medications include amlodipine 5 mg and losartan 100 mg.  Current BP readings range systolic 202R and diastolic 42H-06C. ---Permissive HTN, if >220, treat  B7SE w/o complication with long-term current use of insulin Current CBGs <130 ---SSI, sensitive scale   HLD Lipid profile obtained July 2022- total cholesterol- 126; LDL- 67; HDL-30. ---Continue home medication- atorvastatin 80 mg daily  Essential Tremor Patient has a past medical history of essential tremors and it was noted on physical exam.  He takes propranolol 60 mg a day at home as treatment for his essential tremor.  Nurses report that he is unable to eat properly due to his hands shaking. It has been about 48 hours since the onset of his TIA symptoms. Will restart propranolol to control his tremors. ---Propanolol 40mg  daily   Prior to Admission Living Arrangement: Anticipated Discharge Location: Barriers to Discharge: Dispo: Anticipated discharge in approximately 1-2 day(s).      Timothy Lasso, MD 05/02/2021, 10:52 AM Pager: [phone] After 5pm on weekdays and 1pm on weekends: On Call pager [phone]

## 2021-05-02 NOTE — ED Notes (Signed)
Placed on hospital bed

## 2021-05-02 NOTE — ED Notes (Signed)
Lunch tray ordered 

## 2021-05-02 NOTE — Progress Notes (Signed)
  Echocardiogram 2D Echocardiogram has been performed.  Alex Mccarty 05/02/2021, 2:43 PM

## 2021-05-02 NOTE — Progress Notes (Signed)
Physical Therapy Note  PT eval complete with full note to follow;  Recommend Home for transition out of hospital;  Recommend Outpatient Vestibular PT for dizziness, balance, and gait;  Will follow while in hospital;   Roney Marion, [place] Pager [phone] Office [phone]

## 2021-05-02 NOTE — ED Notes (Signed)
Pt ambulatory with assistance to br

## 2021-05-02 NOTE — Evaluation (Signed)
Physical Therapy Evaluation Patient Details Name: Alex Mccarty MRN: [id number] DOB: 1945/11/29 Today's Date: 05/02/2021   History of Present Illness  Alex Mccarty is a 75 y.o. male  who presented to the ED for evaluation of a transient episode of speech disturbance, impaired gait with a leftward lean, left facial droop, and dizziness; most symptoms returned to baseline over the next few hours, but pt still dizzy. 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 Comes from home where he lives with his wife in a single story home; independent at baseline; presents to PT overall moving much better than yesterday, most stroke symptoms resolved, but with noted dizziness with amb, especially with R turns; Orthostatics negative for BP drop; Pt has had vertigo before, and is familiar with vestibular rehab;  Pt currently with functional limitations due to the deficits listed below (see PT Problem List). Pt will benefit from skilled PT to increase their independence and safety with mobility to allow discharge to the venue listed below.    Dizziness tends to come on with R turns     Follow Up Recommendations Outpatient PT;Other (comment) (for Vestibular Rehab)    Equipment Recommendations  None recommended by PT    Recommendations for Other Services       Precautions / Restrictions Precautions Precautions: Other (comment) Precaution Comments: Fall risk present, but minimal      Mobility  Bed Mobility Overal bed mobility: Independent                  Transfers Overall transfer level: Independent                  Ambulation/Gait Ambulation/Gait assistance: Supervision;Min guard Gait Distance (Feet): 30 Feet Assistive device: None Gait Pattern/deviations: Step-through  pattern Gait velocity: slow   General Gait Details: Walked 30 ft in room, including working around [employer], and walkign around bed; reports dizziness with R turns  Financial trader Rankin (Stroke Patients Only)       Balance Overall balance assessment: Mild deficits observed, not formally tested                                           Pertinent Vitals/Pain Pain Assessment: No/denies pain    Home Living Family/patient expects to be discharged to:: Private residence Living Arrangements: Spouse/significant other Available Help at Discharge: Family Type of Home: House Home Access: Stairs to enter Entrance Stairs-Rails: Right;Left;Can reach both Technical brewer of Steps: 3 Home Layout: One level Home Equipment: None      Prior Function Level of Independence: Independent         Comments: Reports he has had vestibular problems in the past, and has been to [facility] for Vestibular rehab before     Hand Dominance        Extremity/Trunk Assessment   Upper Extremity Assessment Upper Extremity Assessment: Overall WFL for tasks assessed    Lower Extremity Assessment Lower Extremity Assessment: Overall WFL for tasks assessed       Communication   Communication: No difficulties  Cognition Arousal/Alertness: Awake/alert Behavior During Therapy: WFL for tasks assessed/performed Overall Cognitive Status:  Within Functional Limits for tasks assessed                                        General Comments General comments (skin integrity, edema, etc.): Orthostatics neg for BP drop    Exercises     Assessment/Plan    PT Assessment Patient needs continued PT services  PT Problem List Decreased activity tolerance;Decreased balance;Decreased coordination       PT Treatment Interventions DME instruction;Gait training;Stair training;Functional mobility training;Therapeutic  activities;Therapeutic exercise;Balance training;Neuromuscular re-education;Patient/family education    PT Goals (Current goals can be found in the Care Plan section)  Acute Rehab PT Goals Patient Stated Goal: Decr dizziness PT Goal Formulation: With patient Time For Goal Achievement: 05/16/21 Potential to Achieve Goals: Good Additional Goals Additional Goal #1: pt will turn right adn left in standing with no reports of dizziness    Frequency Min 3X/week   Barriers to discharge        Co-evaluation               AM-PAC PT "6 Clicks" Mobility  Outcome Measure Help needed turning from your back to your side while in a flat bed without using bedrails?: None Help needed moving from lying on your back to sitting on the side of a flat bed without using bedrails?: None Help needed moving to and from a bed to a chair (including a wheelchair)?: None Help needed standing up from a chair using your arms (e.g., wheelchair or bedside chair)?: None Help needed to walk in hospital room?: A Little Help needed climbing 3-5 steps with a railing? : A Little 6 Click Score: 22    End of Session   Activity Tolerance: Patient tolerated treatment well Patient left: in bed;with call bell/phone within reach;with family/visitor present Nurse Communication: Mobility status PT Visit Diagnosis: Other abnormalities of gait and mobility (R26.89);Dizziness and giddiness (R42)    Time: 1610-9604 PT Time Calculation (min) (ACUTE ONLY): 47 min   Charges:   PT Evaluation $PT Eval Low Complexity: 1 Low PT Treatments $Gait Training: 8-22 mins $Therapeutic Activity: 8-22 mins        Roney Marion, PT  Acute Rehabilitation Services Pager [phone] Office [phone]   Colletta [place] 05/02/2021, 3:17 PM

## 2021-05-02 NOTE — ED Notes (Signed)
Breakfast Ordered 

## 2021-05-02 NOTE — Progress Notes (Signed)
VASCULAR LAB    Carotid duplex has been performed.  See CV proc for preliminary results.   Uliana Brinker, RVT 05/02/2021, 9:23 AM

## 2021-05-02 NOTE — ED Notes (Signed)
Pt resting in hospital, no distress noted, denies needs at this time. Call light in reach.

## 2021-05-03 DIAGNOSIS — G459 Transient cerebral ischemic attack, unspecified: Secondary | ICD-10-CM

## 2021-05-03 LAB — BASIC METABOLIC PANEL
Anion gap: 6 (ref 5–15)
BUN: 28 mg/dL — ABNORMAL HIGH (ref 8–23)
CO2: 22 mmol/L (ref 22–32)
Calcium: 8.4 mg/dL — ABNORMAL LOW (ref 8.9–10.3)
Chloride: 107 mmol/L (ref 98–111)
Creatinine, Ser: 2.22 mg/dL — ABNORMAL HIGH (ref 0.61–1.24)
GFR, Estimated: 31 mL/min — ABNORMAL LOW (ref >60)
Glucose, Bld: 134 mg/dL — ABNORMAL HIGH (ref 70–99)
Potassium: 4 mmol/L (ref 3.5–5.1)
Sodium: 135 mmol/L (ref 135–145)

## 2021-05-03 LAB — GLUCOSE, CAPILLARY
Glucose-Capillary: 128 mg/dL — ABNORMAL HIGH (ref 70–99)
Glucose-Capillary: 127 mg/dL — ABNORMAL HIGH (ref 70–99)
Glucose-Capillary: 178 mg/dL — ABNORMAL HIGH (ref 70–99)

## 2021-05-03 MED ORDER — CLOPIDOGREL BISULFATE 75 MG PO TABS: 75.0000 mg | ORAL_TABLET | ORAL | 0 refills | Status: DC

## 2021-05-03 MED ORDER — TICAGRELOR 90 MG PO TABS: 90.0000 mg | ORAL_TABLET | Freq: Two times a day (BID) | ORAL | Status: DC

## 2021-05-03 MED ORDER — CLOPIDOGREL BISULFATE 75 MG PO TABS
75.0000 mg | ORAL_TABLET | ORAL | 2 refills | Status: DC
Start: 2021-05-03 — End: 2021-06-24

## 2021-05-03 MED ORDER — PROPRANOLOL HCL ER 60 MG PO CP24
60.0000 mg | ORAL_CAPSULE | Freq: Every day | ORAL | Status: DC
  Administered 2021-05-03: 60 mg via ORAL
  Filled 2021-05-03: qty 1

## 2021-05-03 MED ORDER — ASPIRIN 81 MG PO TBEC: 81.0000 mg | DELAYED_RELEASE_TABLET | Freq: Every day | ORAL | 0 refills | Status: AC

## 2021-05-03 MED ORDER — POLYETHYLENE GLYCOL 3350 17 G PO PACK: 17.0000 g | PACK | Freq: Every day | ORAL | Status: DC | PRN

## 2021-05-03 MED ORDER — SENNOSIDES-DOCUSATE SODIUM 8.6-50 MG PO TABS: 1.0000 | ORAL_TABLET | Freq: Every evening | ORAL | Status: DC | PRN

## 2021-05-03 MED ORDER — TICAGRELOR 90 MG PO TABS: 180.0000 mg | ORAL_TABLET | Freq: Once | ORAL | Status: DC

## 2021-05-03 NOTE — Progress Notes (Signed)
STROKE TEAM PROGRESS NOTE   INTERVAL HISTORY His wife is at the bedside.  He he has had multiple TIAs in the past.  He and his wife have a trip planned in the next few weeks out of state.  MRI scan of brain is negative for acute stroke LDL cholesterol 55 mg percent.  Hemoglobin A1c is 6.6%.  Echocardiogram showed normal ejection fraction.  Carotid ultrasound shows bilateral 1-39% carotid stenosis.  Urine drug screen was negative.  Vitals:   05/03/21 0030 05/03/21 0420 05/03/21 0813 05/03/21 1153  BP: 139/65 (!) 152/60 (!) 144/72 (!) 148/79  Pulse: 65 60    Resp: 18 18    Temp: 98.2 F (36.8 C) 98.1 F (36.7 C) 97.6 F (36.4 C) 97.9 F (36.6 C)  TempSrc: Oral Oral Oral Oral  SpO2: 91% 90%    Weight:      Height:       CBC:  Recent Labs  Lab 05/01/21 1057 05/02/21 0341  WBC 6.4 4.6  HGB 12.6* 11.5*  HCT 40.0 36.3*  MCV 97.6 98.1  PLT 91* 81*   Basic Metabolic Panel:  Recent Labs  Lab 05/02/21 0341 05/03/21 0138  NA 137 135  K 4.3 4.0  CL 106 107  CO2 24 22  GLUCOSE 126* 134*  BUN 25* 28*  CREATININE 2.38* 2.22*  CALCIUM 8.7* 8.4*    Lipid Panel:  Recent Labs  Lab 05/02/21 0341  CHOL 97  TRIG 84  HDL 25*  CHOLHDL 3.9  VLDL 17  LDLCALC 55    HgbA1c:  Recent Labs  Lab 05/01/21 1057  HGBA1C 6.6*   Urine Drug Screen:  Recent Labs  Lab 05/01/21 1800  LABOPIA NONE DETECTED  COCAINSCRNUR NONE DETECTED  LABBENZ NONE DETECTED  AMPHETMU NONE DETECTED  THCU NONE DETECTED  LABBARB NONE DETECTED    Alcohol Level No results for input(s): ETH in the last 168 hours.  IMAGING past 24 hours No results found.  PHYSICAL EXAM  Mental Status: Awake, alert, and oriented to person, place, time, and situation. He is able to provide a clear and coherent history of present illness. Speech/Language: speech is intact without dysarthria.   Naming, repetition, fluency, and comprehension intact without aphasia  No neglect is noted Cranial Nerves:  II: PERRL 3  mm/brisk. Visual fields full.  III, IV, VI: EOMI without ptosis  V: Sensation is intact to light touch and symmetrical to face.   VII: Face is symmetric resting and smiling.  VIII: Hearing is intact to voice IX, X: Palate elevation is symmetric. Phonation normal.  XI: Normal sternocleidomastoid and trapezius muscle strength XII: Tongue protrudes midline without fasciculations.   Motor: 5/5 strength is all muscle groups without vertical drift on assessment.  Tone is normal. Bulk is normal.  Sensation: Intact to light touch bilaterally in all four extremities. No extinction to DSS present.  Coordination: FTN intact bilaterally. HKS intact bilaterally. No pronator drift. DTRs: 2+ and symmetric biceps and patellae  Plantars: Toes downgoing   ASSESSMENT/PLAN Mr. HAIRO GARRAWAY is a 75 y.o. 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  Mr. Gentzler and his wife were out shopping yesterday afternoon around 4 PM when he became dizzy but he attributed this to his history of  vertigo.  The dizziness progressed and he went to bed still dizzy at 22:00.  He woke at 04:00 needing to use the restroom and states that his dizziness had intensified and that he was " bouncing off of walls" when attempting to ambulate.  At 07:00, he was unable to ambulate requiring his wife's help.  She noted that when he was attempting to walk he was leaning towards the left and noted that his speech was slurred, garbled, and unintelligible.  She also noted that he had a left-sided facial droop and activated EMS.  Patient states that when EMS arrived at 09:30 he was "coming out of it" with resolution of dysarthria but still complained of dizziness.  On arrival  to the hospital, his symptoms had resolved and neurology was consulted for further evaluation..   Likely posterior circulation TIA:   Patient has prior history of recurrent TIAs.   CT head: no acute findings MRI  Brain: No acute brain finding. Few old small vessel cerebellar infarctions and minimal small vessel ischemic change of the cerebral hemispheric white matter.   Small amount of fluid in the mastoid air cells on the right. I would expect that this is subclinical.   MRA head: IMPRESSION: Normal MR angiography of the large and medium sized vessels. Carotid Doppler: RICA stenosis 6-28%, LICA stenosis 3-66%. Antegrade flow at bil vertebral arteries.  2D Echo LVEF 60-65%, LA size normal, no IA shunt.    LDL No results found for requested labs within last 26280 hours. HgbA1c 6.6 VTE prophylaxis -  SCD    Diet   Diet heart healthy/carb modified Room service appropriate? Yes; Fluid consistency: Thin   aspirin 81 mg daily and clopidogrel 75 mg daily prior to admission, now on aspirin 81 mg daily and Brilinta 90 mg twice daily for 4 weeks followed by aspirin alone Therapy recommendations:  n/a Disposition:   n/a  Hypertension Home meds:  losartan, inderal Stable Permissive hypertension (OK if < 220/120) but gradually normalize in 5-7 days Long-term BP goal normotensive  Hyperlipidemia Home meds:  lipitor ,  resumed in hospital LDL No results found for requested labs within last 26280 hours., goal < 70   High intensity statin   Continue statin at discharge  Diabetes type II Controlled Home meds:   none HgbA1c 6.6, goal < 7.0 CBGs Recent Labs    05/02/21 2056 05/03/21 0816 05/03/21 1152  GLUCAP 124* 127* 178*    SSI  Other Stroke Risk Factors  Advanced Age >/= 23   Coronary artery disease    Hospital day # 0  Patient presented with transient ataxia and dizziness likely due to postresuscitation TIA and has had previous history of TIAs and has been on Plavix  since recommend changing to aspirin and Brilinta for 4 weeks followed by aspirin alone and maintain aggressive risk factor modification.  Greater than 50% time during this 35-minute visit was spent in counseling and coordination of care and discussion with care team and answering questions.  Long discussion with patient and wife and answered questions.  Follow-up with outpatient stroke clinic in 6 weeks. Antony Contras, MD To contact Stroke Continuity provider, please refer to [email or web address] After hours, contact General Neurology

## 2021-05-03 NOTE — Progress Notes (Signed)
Pt and wife instructed to follow up with outpatient neurology regarding questions about a new medication. Pt and wife verbally agreed and understood instructions.

## 2021-05-03 NOTE — Progress Notes (Signed)
Physical Therapy Treatment Patient Details Name: Alex Mccarty MRN: [id number] DOB: May 21, 1946 Today's Date: 05/03/2021    History of Present Illness Alex Mccarty is a 75 y.o. male  who presented to the ED for evaluation of a transient episode of speech disturbance, impaired gait with a leftward lean, left facial droop, and dizziness; most symptoms returned to baseline over the next few hours, but pt still dizzy. 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 Reports success with OP vestibular therapy in the past although he does have a general level of dizziness most of the time and states it keeps him from working on his classic cars. Also reports difficulty laying on left side due to past shoulder injury. Does seem to flag positive for potential R posterior canal BPPV with modified testing in hospital bed. Attempted to address with Semont maneuver today, educated on benefits of skilled OP vestibular f/u after DC. Left sitting at EOB with all needs met this morning. Will continue to follow.     Follow Up Recommendations  Outpatient PT;Other (comment) (vestibular rehab)     Equipment Recommendations  None recommended by PT    Recommendations for Other Services       Precautions / Restrictions Precautions Precautions: Other (comment) Precaution Comments: Fall risk present, but minimal Restrictions Weight Bearing Restrictions: No    Mobility  Bed Mobility Overal bed mobility: Independent                  Transfers Overall transfer level: Independent                  Ambulation/Gait Ambulation/Gait assistance: Supervision Gait Distance (Feet): 60 Feet Assistive device: None Gait Pattern/deviations:  Step-through pattern Gait velocity: slow   General Gait Details: at a general S level, dizziness 3/10 but steady   Stairs             Wheelchair Mobility    Modified Rankin (Stroke Patients Only)       Balance Overall balance assessment: Mild deficits observed, not formally tested                                          Cognition Arousal/Alertness: Awake/alert Behavior During Therapy: WFL for tasks assessed/performed Overall Cognitive Status: Within Functional Limits for tasks assessed                                        Exercises      General Comments        Pertinent Vitals/Pain Pain Assessment: No/denies pain    Home Living                      Prior Function            PT Goals (current goals can now be found in the care plan section) Acute Rehab PT Goals Patient Stated Goal: Decr dizziness PT Goal Formulation: With patient Time For Goal Achievement: 05/16/21 Potential to Achieve Goals: Good Additional Goals Additional Goal #1: pt will turn right adn left  in standing with no reports of dizziness Progress towards PT goals: Progressing toward goals    Frequency    Min 3X/week      PT Plan Current plan remains appropriate    Co-evaluation              AM-PAC PT "6 Clicks" Mobility   Outcome Measure  Help needed turning from your back to your side while in a flat bed without using bedrails?: None Help needed moving from lying on your back to sitting on the side of a flat bed without using bedrails?: None Help needed moving to and from a bed to a chair (including a wheelchair)?: None Help needed standing up from a chair using your arms (e.g., wheelchair or bedside chair)?: None Help needed to walk in hospital room?: None Help needed climbing 3-5 steps with a railing? : A Little 6 Click Score: 23    End of Session   Activity Tolerance: Patient tolerated treatment well Patient left:  in bed;with call bell/phone within reach Nurse Communication: Mobility status PT Visit Diagnosis: Other abnormalities of gait and mobility (R26.89);Dizziness and giddiness (R42)     Time: 4935-5217 PT Time Calculation (min) (ACUTE ONLY): 29 min  Charges:  $Gait Training: 8-22 mins $Neuromuscular Re-education: 8-22 mins                    Windell Norfolk, DPT, PN2   Supplemental Physical Therapist [place]    Pager [phone] Acute Rehab Office [phone]

## 2021-05-03 NOTE — Discharge Summary (Signed)
Name: Alex Mccarty MRN: [id number] DOB: 18-Jul-1946 75 y.o. PCP: Mariel Sleet  Date of Admission: 05/01/2021 10:48 AM Date of Discharge: 05/03/2021 Attending Physician: Angelica Pou, MD  Discharge Diagnosis: 1. TIA 2. COVID-19 Infection 3. CKD IIIA 4. BPPV   Discharge Medications: Allergies as of 05/03/2021       Reactions   Amlodipine Besylate Other (See Comments)   HIVES   Codeine Nausea And Vomiting   Penicillins Other (See Comments)   Childhood reaction Has patient had a PCN reaction causing immediate rash, facial/tongue/throat swelling, SOB or lightheadedness with hypotension: Unknown Has patient had a PCN reaction causing severe rash involving mucus membranes or skin necrosis: Unknown Has patient had a PCN reaction that required hospitalization: Unknown Has patient had a PCN reaction occurring within the last 10 years: Unknown If all of the above answers are "NO", then may proceed with Cephalosporin use.   Sulfa Antibiotics Nausea And Vomiting        Medication List     TAKE these medications    amLODipine 5 MG tablet Commonly known as: NORVASC Take 2.5 mg by mouth daily.   aspirin 81 MG EC tablet Commonly known as: ASPIRIN LOW DOSE Take 1 tablet (81 mg total) by mouth at bedtime for 21 days. Take Aspirin 81mg  once a day for 21 days (3 weeks) What changed: additional instructions   atorvastatin 80 MG tablet Commonly known as: LIPITOR Take 80 mg by mouth at bedtime.   cetirizine 10 MG tablet Commonly known as: ZYRTEC Take 10 mg by mouth daily as needed for allergies.   clopidogrel 75 MG tablet Commonly known as: PLAVIX Take 1 tablet (75 mg total) by mouth every morning. Take Plavix 75mg  once a day for 21 days (3 weeks) What changed: additional instructions   COSAMIN DS PO Take 1 capsule by mouth at bedtime.   cyclobenzaprine 10 MG tablet Commonly known as: FLEXERIL Take 10 mg by mouth as needed (muscle pain).    gabapentin 300 MG capsule Commonly known as: NEURONTIN Take 300 mg by mouth at bedtime.   Lantus SoloStar 100 UNIT/ML Solostar Pen Generic drug: insulin glargine Inject 45 Units into the skin every morning.   losartan 100 MG tablet Commonly known as: COZAAR Take 100 mg by mouth every evening.   omeprazole 40 MG capsule Commonly known as: PRILOSEC Take 40 mg by mouth every morning.   Pfizer-BioNTech COVID-19 Vacc 30 MCG/0.3ML injection Generic drug: COVID-19 mRNA vaccine (Pfizer) INJECT AS DIRECTED   potassium citrate 5 MEQ (540 MG) SR tablet Commonly known as: UROCIT-K Take 10 mEq by mouth every morning.   propranolol 20 MG tablet Commonly known as: INDERAL Take 60 mg by mouth every morning.   tiZANidine 4 MG tablet Commonly known as: ZANAFLEX Take 4 mg by mouth every 6 (six) hours as needed for muscle spasms.   Vitamin D3 50 MCG (2000 UT) Tabs Take 1 tablet by mouth daily.        Disposition and follow-up:   Alex Mccarty was discharged from [facility] in Stable condition.  At the hospital follow up visit please address:  1.  TIA. Experienced slurred speech, left sided weakness, urinary incontinence. Symptoms have resolved. 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 ECHO with LVEF 60-65%, but no RWMA or shunt noted. Carotid dopplers negative. Discharging with DAPT x3 weeks, then plavix alone indefinitely. Also discharging with lipitor. Neurology to f/u as outpatient.  2. COVID-19  Infection: Incidental positive. Initially asymptomatic, but now having some congestion and cough. Not requiring oxygen supplementation. Advised to quarantine x10days and to contact PCP/go to ED if worsening symptoms.  3. CKD Stage IIIA: Baseline Cr ~2.1-2.2 per patient. On day of discharge, Cr 2.22 so likely at baseline. Follows with nephrologist at [facility].  4. BPPV: History of vertigo, likely BPPV given description of symptoms.  Placed ambulatory referral for Vestibular PT to help with this as outpatient.  2.  Labs / imaging needed at time of follow-up: BMP  3.  Pending labs/ test needing follow-up: None  Follow-up Appointments:  Follow-up Information     Terril, [place], PA-C Follow up in 2 week(s).   Specialty: Family Medicine Contact information: [address] [place] [address] [phone]                 Hospital Course by problem list: 1. TIA- Patient presented to the hospital on 05/01/2021 presenting with dizziness, slurred speech, difficulty walking, urinary incontinence.  On arrival in the ED all symptoms resolved.  CT, MRI, MRA did not reveal any significant findings of a new stroke.  Carotid Doppler and echocardiogram were reassuring.  Diagnosis of TIA was determined by neurology.  Patient was discharged on 05/03/2021 and was advised to take DAPT, aspirin 81 mg and Plavix 75 mg for 3 weeks and Plavix indefinitely after. 2. COVID-19 Infection- During initial screening in the ED patient tested positive for COVID and denied symptoms at that time.  On 05/02/2021 patient complained of congestion and cough.  But denies shortness of breath or any other symptoms.  Patient did not require oxygen supplement. Discharge on 05/03/2021 patient was advised to quarantine for 10 days and monitor for any worsening of symptoms. 3. CKD IIIA- On presentation to the ED, initial labs revealed elevated creatinine levels at 2.41. Patient states his baseline usually ranges from 2-2.1.  Home medications were held and IV normal saline bolus was given for hydration and kidney function improvement.  At discharge the patient's current creatinine level was at 2.2, acceptable. 4. BPPV- Patient has a PMH of vertigo, most likely BPPV due to dizziness being induced by positional changes.  Patient still experience episodes of dizziness throughout hospital course.  He was evaluated by PT and recommended for vestibular  PT.  A referral was placed for vestibular PT outpatient.   Subjective: I seen and evaluated Mr. Leiber at bedside.  He states that he is feeling well.  He reports his COVID symptoms are subsiding, he is coughing less and denies congestion.  He denies headaches, chest pain, shortness of breath, abdominal pain, or lower extremity weakness/numbness.  He states that he still experiences dizziness when he bends forward or reaches backwards.  Otherwise he feels back to his usual state of health.  He acknowledges and understands that he will have to quarantine for 10 days following discharge.  Discharge Exam:   BP (!) 148/79 (BP Location: Right Arm)   Pulse 60   Temp 97.9 F (36.6 C) (Oral)   Resp 18   Ht 5\' 9"  (1.753 m)   Wt 83.9 kg   SpO2 90%   BMI 27.32 kg/m  Discharge exam: Physical Exam Constitutional:      General: He is awake.     Appearance: Normal appearance.  HENT:     Head: Normocephalic and atraumatic.  Eyes:     Extraocular Movements:     Right eye: No nystagmus.  Left eye: No nystagmus.  Cardiovascular:     Rate and Rhythm: Normal rate.     Pulses:          Radial pulses are 2+ on the right side and 2+ on the left side.     Heart sounds: Normal heart sounds.  Pulmonary:     Effort: Pulmonary effort is normal. No accessory muscle usage, respiratory distress or retractions.     Breath sounds: Decreased breath sounds present.  Abdominal:     Palpations: Abdomen is soft.  Skin:    General: Skin is warm and dry.  Neurological:     Mental Status: He is alert and oriented to person, place, and time.     Cranial Nerves: No dysarthria.  Psychiatric:        Mood and Affect: Mood normal.        Behavior: Behavior normal. Behavior is cooperative.     Pertinent Labs, Studies, and Procedures:  CBC Latest Ref Rng & Units 05/02/2021 05/01/2021 10/05/2017  WBC 4.0 - 10.5 K/uL 4.6 6.4 6.9  Hemoglobin 13.0 - 17.0 g/dL 11.5(L) 12.6(L) 13.9  Hematocrit 39.0 - 52.0 % 36.3(L) 40.0  41.5  Platelets 150 - 400 K/uL 81(L) 91(L) 92(L)   CMP Latest Ref Rng & Units 05/03/2021 05/02/2021 05/01/2021  Glucose 70 - 99 mg/dL 134(H) 126(H) 149(H)  BUN 8 - 23 mg/dL 28(H) 25(H) 26(H)  Creatinine 0.61 - 1.24 mg/dL 2.22(H) 2.38(H) 2.41(H)  Sodium 135 - 145 mmol/L 135 137 137  Potassium 3.5 - 5.1 mmol/L 4.0 4.3 4.2  Chloride 98 - 111 mmol/L 107 106 109  CO2 22 - 32 mmol/L 22 24 22   Calcium 8.9 - 10.3 mg/dL 8.4(L) 8.7(L) 8.7(L)  Total Protein 6.5 - 8.1 g/dL - 5.4(L) 6.1(L)  Total Bilirubin 0.3 - 1.2 mg/dL - 0.6 0.8  Alkaline Phos 38 - 126 U/L - 47 49  AST 15 - 41 U/L - 17 20  ALT 0 - 44 U/L - 22 26   Lipid Panel     Component Value Date/Time   CHOL 97 05/02/2021 0341   TRIG 84 05/02/2021 0341   HDL 25 (L) 05/02/2021 0341   CHOLHDL 3.9 05/02/2021 0341   VLDL 17 05/02/2021 0341   LDLCALC 55 05/02/2021 0341   Lab Results  Component Value Date   HGBA1C 6.6 (H) 05/01/2021   Urinalysis    Component Value Date/Time   COLORURINE YELLOW 05/01/2021 1800   APPEARANCEUR CLEAR 05/01/2021 1800   LABSPEC 1.014 05/01/2021 1800   PHURINE 5.0 05/01/2021 1800   GLUCOSEU 50 (A) 05/01/2021 1800   HGBUR SMALL (A) 05/01/2021 1800   BILIRUBINUR NEGATIVE 05/01/2021 1800   KETONESUR NEGATIVE 05/01/2021 1800   PROTEINUR 100 (A) 05/01/2021 1800   UROBILINOGEN 0.2 03/09/2013 1157   NITRITE NEGATIVE 05/01/2021 1800   LEUKOCYTESUR NEGATIVE 05/01/2021 1800    Drugs of Abuse     Component Value Date/Time   LABOPIA NONE DETECTED 05/01/2021 1800   COCAINSCRNUR NONE DETECTED 05/01/2021 1800   LABBENZ NONE DETECTED 05/01/2021 1800   AMPHETMU NONE DETECTED 05/01/2021 1800   THCU NONE DETECTED 05/01/2021 1800   LABBARB NONE DETECTED 05/01/2021 1800    Lab Results  Component Value Date   SARSCOV2NAA POSITIVE (A) 05/01/2021   INFAP NEGATIVE 05/01/2021   INFBP NEGATIVE 05/01/2021   CT HEAD WO CONTRAST  Result Date: 05/01/2021 CLINICAL DATA:  Dizziness, vertigo since last night. EXAM: CT  HEAD WITHOUT CONTRAST TECHNIQUE: Contiguous axial images were obtained from the  base of the skull through the vertex without intravenous contrast. COMPARISON:  None. FINDINGS: Brain: Mild generalized age related parenchymal volume loss with commensurate dilatation of the ventricles and sulci. Minimal chronic small vessel ischemic changes within the deep periventricular white matter regions bilaterally. No mass, hemorrhage, edema or other evidence of acute parenchymal abnormality. No extra-axial hemorrhage. Vascular: Chronic calcified atherosclerotic changes of the large vessels at the skull base. No unexpected hyperdense vessel. Skull: Normal. Negative for fracture or focal lesion. Sinuses/Orbits: No acute finding. Other: None. IMPRESSION: 1. No acute findings. No intracranial mass, hemorrhage or edema. 2. Mild chronic small vessel ischemic changes in the white matter. Electronically Signed   By: Franki Cabot M.D.   On: 05/01/2021 11:59   MR ANGIO HEAD WO CONTRAST  Result Date: 05/01/2021 CLINICAL DATA:  Transient ischemic attack. Memory loss. Mental status change, unknown cause. EXAM: MRA HEAD WITHOUT CONTRAST TECHNIQUE: Angiographic images of the Circle of Willis were acquired using MRA technique without intravenous contrast. COMPARISON:  MRI head same day. FINDINGS: Anterior circulation: Both internal carotid arteries are widely patent through the skull base and siphon regions. The anterior and middle cerebral vessels are patent without proximal stenosis, aneurysm or vascular malformation. No identifiable branch vessel occlusion. Posterior circulation: Both vertebral arteries are widely patent to the basilar. No basilar stenosis. Posterior circulation branch vessels are normal. Right PCA takes fetal origin from the anterior circulation. Anatomic variants: None significant otherwise. Other: None. IMPRESSION: Normal MR angiography of the large and medium sized vessels. Electronically Signed   By: Nelson Chimes  M.D.   On: 05/01/2021 15:12   MR BRAIN WO CONTRAST  Result Date: 05/01/2021 CLINICAL DATA:  Transient ischemic attack. Memory loss. Mental status change, unknown cause. EXAM: MRI HEAD WITHOUT CONTRAST TECHNIQUE: Multiplanar, multiecho pulse sequences of the brain and surrounding structures were obtained without intravenous contrast. COMPARISON:  Head CT same day. FINDINGS: Brain: Diffusion imaging does not show any acute or subacute infarction. The brainstem is normal. Few old small vessel cerebellar infarctions. Minimal small vessel change of the cerebral hemispheric white matter. No cortical or large vessel territory infarction. No mass lesion, hemorrhage, hydrocephalus or extra-axial collection. Vascular: Major vessels at the base of the brain show flow. Skull and upper cervical spine: Negative Sinuses/Orbits: Paranasal sinuses are clear.  Orbits are normal. Other: Small amount of fluid in the mastoid air cells on the right. No fluid in the middle ear. IMPRESSION: No acute brain finding. Few old small vessel cerebellar infarctions and minimal small vessel ischemic change of the cerebral hemispheric white matter. Small amount of fluid in the mastoid air cells on the right. I would expect that this is subclinical. Electronically Signed   By: Nelson Chimes M.D.   On: 05/01/2021 12:32   DG Chest Portable 1 View  Result Date: 05/01/2021 CLINICAL DATA:  Cough, dizziness EXAM: PORTABLE CHEST 1 VIEW COMPARISON:  Chest x-ray dated 03/04/2019 FINDINGS: The heart size and mediastinal contours are within normal limits. Both lungs are clear. The visualized skeletal structures are unremarkable. IMPRESSION: No active disease.  No evidence of pneumonia or pulmonary edema. Electronically Signed   By: Franki Cabot M.D.   On: 05/01/2021 11:56   VAS US CAROTID  Result Date: 05/02/2021 Carotid Arterial Duplex Study Patient Name:  ALFRED HARREL  Date of Exam:   05/02/2021 Medical Rec #: [id number]         Accession #:     [id number] Date of Birth: Nov 16, 1945  Patient Gender: M Patient Age:   89 years Exam Location:  [facility] Procedure:      VAS US CAROTID Referring Phys: Anibal Henderson --------------------------------------------------------------------------------  Indications:       TIA, Speech disturbance and Dizziness, gait disturbance,                    facial droop. Risk Factors:      Hypertension, hyperlipidemia, past history of smoking. Other Factors:     Current Covid 19 infection. Comparison Study:  No prior study on file Performing Technologist: Sharion Dove RVS  Examination Guidelines: A complete evaluation includes B-mode imaging, spectral Doppler, color Doppler, and power Doppler as needed of all accessible portions of each vessel. Bilateral testing is considered an integral part of a complete examination. Limited examinations for reoccurring indications may be performed as noted.  Right Carotid Findings: +----------+--------+--------+--------+------------------+------------------+           PSV cm/sEDV cm/sStenosisPlaque DescriptionComments           +----------+--------+--------+--------+------------------+------------------+ CCA Prox  88      17                                intimal thickening +----------+--------+--------+--------+------------------+------------------+ CCA Distal86      16                                intimal thickening +----------+--------+--------+--------+------------------+------------------+ ICA Prox  71      17              heterogenous                         +----------+--------+--------+--------+------------------+------------------+ ICA Distal85      24                                                   +----------+--------+--------+--------+------------------+------------------+ ECA       116     13              heterogenous                         +----------+--------+--------+--------+------------------+------------------+  +----------+--------+-------+--------+-------------------+           PSV cm/sEDV cmsDescribeArm Pressure (mmHG) +----------+--------+-------+--------+-------------------+ [id number]                                         +----------+--------+-------+--------+-------------------+ +---------+--------+--+--------+--+ VertebralPSV cm/s74EDV cm/s13 +---------+--------+--+--------+--+  Left Carotid Findings: +----------+--------+--------+--------+------------------+------------------+           PSV cm/sEDV cm/sStenosisPlaque DescriptionComments           +----------+--------+--------+--------+------------------+------------------+ CCA Prox  111     19                                intimal thickening +----------+--------+--------+--------+------------------+------------------+ CCA Distal91      17  intimal thickening +----------+--------+--------+--------+------------------+------------------+ ICA Prox  86      15              heterogenous                         +----------+--------+--------+--------+------------------+------------------+ ICA Distal78      20                                                   +----------+--------+--------+--------+------------------+------------------+ ECA       103     7               heterogenous                         +----------+--------+--------+--------+------------------+------------------+ +----------+--------+--------+--------+-------------------+           PSV cm/sEDV cm/sDescribeArm Pressure (mmHG) +----------+--------+--------+--------+-------------------+ [id number]                                         +----------+--------+--------+--------+-------------------+ +---------+--------+--+--------+--+ VertebralPSV cm/s69EDV cm/s12 +---------+--------+--+--------+--+   Summary: Right Carotid: Velocities in the right ICA are consistent with a 1-39% stenosis. Left  Carotid: Velocities in the left ICA are consistent with a 1-39% stenosis. Vertebrals:  Bilateral vertebral arteries demonstrate antegrade flow. Subclavians: Normal flow hemodynamics were seen in bilateral subclavian              arteries. *See table(s) above for measurements and observations.  Electronically signed by Harold Barban MD on 05/02/2021 at 3:29:47 PM.    Final    ECHOCARDIOGRAM LIMITED  Result Date: 05/02/2021    ECHOCARDIOGRAM LIMITED REPORT   Patient Name:   ABDULRAHIM SIDDIQI Date of Exam: 05/02/2021 Medical Rec #:  [id number]        Height:       69.0 in Accession #:    [id number]       Weight:       185.0 lb Date of Birth:  [birth year]       BSA:          1.999 m Patient Age:    29 years         BP:           139/78 mmHg Patient Gender: M                HR:           69 bpm. Exam Location:  Inpatient Procedure: Limited Echo, Limited Color Doppler and Cardiac Doppler Indications:    stroke  History:        Patient has no prior history of Echocardiogram examinations.                 Chronic kidney disease; Risk Factors:Sleep Apnea, Dyslipidemia,                 Diabetes and Hypertension.  Sonographer:    Johny Chess RDCS Referring Phys: [id number] [place]  1. Left ventricular ejection fraction, by estimation, is 60 to 65%. The left ventricle has normal function. The left ventricle has no regional wall motion abnormalities. Left ventricular diastolic parameters are consistent with Grade I diastolic dysfunction (impaired relaxation).  2. The tricuspid  valve is abnormal. Tricuspid valve regurgitation is mild.  3. The aortic valve is tricuspid. Aortic valve regurgitation is not visualized.  4. There is normal pulmonary artery systolic pressure.  5. The inferior vena cava is normal in size with greater than 50% respiratory variability, suggesting right atrial pressure of 3 mmHg. FINDINGS  Left Ventricle: Left ventricular ejection fraction, by estimation, is 60 to 65%. The left  ventricle has normal function. The left ventricle has no regional wall motion abnormalities. There is no left ventricular hypertrophy. Left ventricular diastolic parameters are consistent with Grade I diastolic dysfunction (impaired relaxation). Indeterminate filling pressures. Right Ventricle: There is normal pulmonary artery systolic pressure. The tricuspid regurgitant velocity is 2.81 m/s, and with an assumed right atrial pressure of 3 mmHg, the estimated right ventricular systolic pressure is 95.6 mmHg. Left Atrium: Left atrial size was normal in size. Right Atrium: Right atrial size was normal in size. Tricuspid Valve: The tricuspid valve is abnormal. Tricuspid valve regurgitation is mild. Aortic Valve: The aortic valve is tricuspid. Aortic valve regurgitation is not visualized. Venous: The inferior vena cava is normal in size with greater than 50% respiratory variability, suggesting right atrial pressure of 3 mmHg. IAS/Shunts: No atrial level shunt detected by color flow Doppler. LEFT VENTRICLE PLAX 2D LVIDd:         4.60 cm  Diastology LVIDs:         3.10 cm  LV e' medial:    6.96 cm/s LV PW:         0.80 cm  LV E/e' medial:  10.9 LV IVS:        0.80 cm  LV e' lateral:   8.81 cm/s LVOT diam:     1.80 cm  LV E/e' lateral: 8.6 LV SV:         48 LV SV Index:   24 LVOT Area:     2.54 cm  IVC IVC diam: 1.60 cm LEFT ATRIUM         Index LA diam:    3.70 cm 1.85 cm/m  AORTIC VALVE LVOT Vmax:   92.00 cm/s LVOT Vmean:  59.500 cm/s LVOT VTI:    0.188 m MITRAL VALVE                TRICUSPID VALVE MV Area (PHT): 2.99 cm     TR Peak grad:   31.6 mmHg MV Decel Time: 254 msec     TR Vmax:        281.00 cm/s MV E velocity: 75.80 cm/s MV A velocity: 106.00 cm/s  SHUNTS MV E/A ratio:  0.72         Systemic VTI:  0.19 m                             Systemic Diam: 1.80 cm Lyman Bishop MD Electronically signed by Lyman Bishop MD Signature Date/Time: 05/02/2021/2:56:52 PM    Final      Discharge Instructions: Discharge  Instructions     Call MD for:  difficulty breathing, headache or visual disturbances   Complete by: As directed    Call MD for:  extreme fatigue   Complete by: As directed    Call MD for:  hives   Complete by: As directed    Call MD for:  persistant dizziness or light-headedness   Complete by: As directed    Call MD for:  persistant nausea and vomiting   Complete by: As  directed    Call MD for:  redness, tenderness, or signs of infection (pain, swelling, redness, odor or green/yellow discharge around incision site)   Complete by: As directed    Call MD for:  severe uncontrolled pain   Complete by: As directed    Call MD for:  temperature >100.4   Complete by: As directed    Diet - low sodium heart healthy   Complete by: As directed    Discharge instructions   Complete by: As directed    Take Aspirin 81mg  once a day for the next 21 days (3 weeks). Please take plavix once daily as you have been. You will be scheduled for a follow up visit with neurology in about 4 weeks. Someone will call you with more information about the appointment.  Resume your other home medications and take them as prescribed.   If your COVID symptoms worsen, such as more coughing, shortness of breath, fever, chills or anything out of the ordinary for you occurs, call your primary care doctor right away.   Please Quarantine for 10 days and retest after quarantine period has ended. Also have your wife test for COVID due to close contact.   A referral was sent for vestibular rehab; once you are COVID negative, they can schedule an appointment with you.   Follow up with your primary care doctor to assess how you are doing.   Increase activity slowly   Complete by: As directed        Signed: Virl Axe, MD 05/03/2021, 2:32 PM   Pager: [phone]

## 2021-05-03 NOTE — Progress Notes (Signed)
Pt discharged in stable condition. Questions pertaining to new medications and prescription answered. Wife accompanied on discharge.

## 2021-05-10 DIAGNOSIS — R42 Dizziness and giddiness: Secondary | ICD-10-CM | POA: Diagnosis not present

## 2021-05-10 DIAGNOSIS — U071 COVID-19: Secondary | ICD-10-CM | POA: Diagnosis not present

## 2021-05-11 ENCOUNTER — Ambulatory Visit: Payer: HMO | Admitting: Gastroenterology

## 2021-05-28 ENCOUNTER — Ambulatory Visit: Payer: No Typology Code available for payment source | Attending: Internal Medicine | Admitting: Physical Therapy

## 2021-05-28 ENCOUNTER — Encounter: Payer: Self-pay | Admitting: Physical Therapy

## 2021-05-28 ENCOUNTER — Other Ambulatory Visit: Payer: Self-pay

## 2021-05-28 DIAGNOSIS — R262 Difficulty in walking, not elsewhere classified: Secondary | ICD-10-CM | POA: Insufficient documentation

## 2021-05-28 DIAGNOSIS — R42 Dizziness and giddiness: Secondary | ICD-10-CM | POA: Diagnosis present

## 2021-05-28 DIAGNOSIS — R2681 Unsteadiness on feet: Secondary | ICD-10-CM | POA: Insufficient documentation

## 2021-05-28 DIAGNOSIS — H8113 Benign paroxysmal vertigo, bilateral: Secondary | ICD-10-CM | POA: Insufficient documentation

## 2021-05-28 NOTE — Therapy (Signed)
[facility] [address] [place], [place], [address] Phone: [phone]   Fax:  [phone]  Physical Therapy Evaluation  Patient Details  Name: Alex Mccarty MRN: [id number] Date of Birth: 1946/03/30 Referring Provider (PT): Annetta Maw, MD   Encounter Date: 05/28/2021   PT End of Session - 05/28/21 0911     Visit Number 1    Number of Visits 9    Date for PT Re-Evaluation 07/27/21    Authorization Type VA has approved 15 visits to 09/15/21    Authorization - Visit Number 1    Authorization - Number of Visits 15    PT Start Time 0755    PT Stop Time 0845    PT Time Calculation (min) 50 min    Activity Tolerance Patient tolerated treatment well    Behavior During Therapy [facility] for tasks assessed/performed             Past Medical History:  Diagnosis Date   Arthritis    Bowel obstruction ([place])    CKD (chronic kidney disease)    unkknown stage per patient   Diabetes mellitus ([place])    Hyperlipidemia    Hypertension    Kidney stones     Past Surgical History:  Procedure Laterality Date   COLONOSCOPY  10/2015   Dr Kenton Kingfisher   COLOSTOMY REVISION Right 09/29/2017   Procedure: COLON RESECTION RIGHT;  Surgeon: Jovita Kussmaul, MD;  Location: [facility];  Service: General;  Laterality: Right;   ELBOW SURGERY     ulnar nerve repair   ESOPHAGOGASTRODUODENOSCOPY     same time    FOOT SURGERY     crushed heel   LAPAROTOMY N/A 09/29/2017   Procedure: EXPLORATORY LAPAROTOMY;  Surgeon: Jovita Kussmaul, MD;  Location: [facility];  Service: General;  Laterality: N/A;    There were no vitals filed for this visit.    Subjective Assessment - 05/28/21 0800     Subjective Pt has history of Vertigo; woke up one morning with stroke like symptoms.  Went to ED and diagnosed with COVID and TIA; since the hospital vertigo has become more intense and more often.  Has to get up very slowly in the morning.  Has had therapy in  the past for vertigo with full resolution of dizziness.  Tried manuever in the hospital but it made him nauseous and vomit.  Has not tried maneuver at home.    Patient is accompained by: Family member    Pertinent History old cerebellar infarcts, OA, CKD, DM, DDD cervical, HTN, lumbar DDD, lumbar radiculopathy, tremor, cataract extraction with intraocular lens implant, R THA, L elbow ligament repair, L rotator cuff repair, shoulder arthoscopy with biceps tenodesis, exposure to agent orange, L carpal tunnel, L ulnar neuropathy, insomnia, OSA    Patient Stated Goals Make the dizziness go away    Currently in Pain? No/denies                [facility] PT Assessment - 05/28/21 0805       Assessment   Medical Diagnosis Recurrent Vertigo    Referring Provider (PT) Annetta Maw, MD    Onset Date/Surgical Date 05/01/21    Hand Dominance Right    Prior Therapy yes for vertigo      Precautions   Precautions Other (comment)    Precaution Comments old cerebellar infarcts, OA, CKD, DM, DDD cervical, HTN, lumbar DDD, lumbar radiculopathy, tremor, cataract extraction with intraocular lens implant, R THA,  L elbow ligament repair, L rotator cuff repair, shoulder arthoscopy with biceps tenodesis, exposure to agent orange, L carpal tunnel, L ulnar neuropathy, insomnia, OSA      Balance Screen   Has the patient fallen in the past 6 months No      [place] residence    Living Arrangements Spouse/significant other      Prior Function   Level of [place] Retired    Leisure Is still driving; 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      Observation/Other Assessments   Focus on Therapeutic Outcomes (FOTO)  DPS: 43; DFS: 45.7      Sensation   Light Touch Appears Intact      Coordination   Gross Motor Movements are Fluid and Coordinated Yes    Heel Shin Test WFL      ROM / Strength   AROM / PROM / Strength  Strength      Strength   Overall Strength Within functional limits for tasks performed               Vestibular Assessment - 05/28/21 0812       Symptom Behavior   Subjective history of current problem No dizziness at rest, has had nausea and vomiting, denies HA, denies neck pain currently, has experienced hearing loss and tinnitus, has retinopathy.    Type of Dizziness  "Funny feeling in head"    Frequency of Dizziness daily    Duration of Dizziness seconds    Symptom Nature Motion provoked;Positional    Aggravating Factors Lying supine;Sitting with head tilted back;Turning body quickly;Turning head quickly;Supine to sit;Forward bending    Relieving Factors Slow movements    Progression of Symptoms Worse      Oculomotor Exam   Oculomotor Alignment Normal    Spontaneous Absent    Gaze-induced  Absent    Smooth Pursuits Saccades    Saccades Intact      Oculomotor Exam-Fixation Suppressed    Left Head Impulse positive    Right Head Impulse negative      Vestibulo-Ocular Reflex   VOR to Slow Head Movement Normal    VOR Cancellation Corrective saccades      Positional Testing   Dix-Hallpike Dix-Hallpike Right;Dix-Hallpike Left    Horizontal Canal Testing Horizontal Canal Right;Horizontal Canal Left      Dix-Hallpike Right   Dix-Hallpike Right Duration 30    Dix-Hallpike Right Symptoms Upbeat, right rotatory nystagmus      Dix-Hallpike Left   Dix-Hallpike Left Duration 60    Dix-Hallpike Left Symptoms Downbeat, right rotatory nystagmus      Horizontal Canal Right   Horizontal Canal Right Duration 60    Horizontal Canal Right Symptoms Normal      Horizontal Canal Left   Horizontal Canal Left Duration 0    Horizontal Canal Left Symptoms Normal      Positional Sensitivities   Sit to Supine Mild dizziness    Supine to Left Side No dizziness    Supine to Right Side Mild dizziness    Supine to Sitting No dizziness    Right Hallpike Mild dizziness    Up from  Right Hallpike Mild dizziness    Up from Left Hallpike Mild dizziness    Rolling Right Mild dizziness    Rolling Left No dizziness                Objective  measurements completed on examination: See above findings.        Vestibular Treatment/Exercise - 05/28/21 0836       Vestibular Treatment/Exercise   Vestibular Treatment Provided Canalith Repositioning    Canalith Repositioning Epley Manuever Right       EPLEY MANUEVER RIGHT   Number of Reps  2    Overall Response No change    Response Details  mild dizziness and nausea after performing treatment                    PT Education - 05/28/21 0910     Education Details clinical findings, BPPV, PT POC and goals    Person(s) Educated Patient;Spouse    Methods Explanation    Comprehension Verbalized understanding              PT Short Term Goals - 05/28/21 0916       PT SHORT TERM GOAL #1   Title Pt will demonstrate independence with initial HEP/home epley maneuver to allow pt to perform as needed on trip to [place]    Time 4    Period Weeks    Status New    Target Date 06/27/21               PT Long Term Goals - 05/28/21 0917       PT LONG TERM GOAL #1   Title Pt will demonstrate independence with final HEP    Time 8    Period Weeks    Status New    Target Date 07/27/21      PT LONG TERM GOAL #2   Title Pt will report increase on FOTO DPS to >/= 58 and DFS to >/= 50    Baseline 43 and 45.7    Time 8    Period Weeks    Status New    Target Date 07/27/21      PT LONG TERM GOAL #3   Title Pt will demonstrate negative positional testing for R and L side    Baseline R and L BPPV    Time 8    Period Weeks    Status New    Target Date 07/27/21      PT LONG TERM GOAL #4   Title Pt will report 0/5 dizziness when performing all movements on MSQ    Baseline TBD    Time 8    Period Weeks    Status New    Target Date 07/27/21      PT LONG TERM GOAL #5   Title Pt will  demonstrate improved VOR gain as indicated by decrease in DVA to 2-3 line difference    Baseline TBD    Time 8    Period Weeks    Status New    Target Date 07/27/21                    Plan - 05/28/21 0911     Clinical Impression Statement Pt is a 75 year old male referred to Neuro OPPT for evaluation of recurrent vertigo after TIA and COVID-19 infection.  Pt's PMH is significant for the following: old cerebellar infarcts, OA, CKD, DM, DDD cervical, HTN, lumbar DDD, lumbar radiculopathy, tremor, cataract extraction with intraocular lens implant, R THA, L elbow ligament repair, L rotator cuff repair, shoulder arthoscopy with biceps tenodesis, exposure to agent orange, L carpal tunnel, L ulnar neuropathy, insomnia, OSA. The following deficits were noted during pt's exam: vertigo  and nystagmus during L Dix-Hallpike and R Dix-Hallpike indicating bilateral BPPV, more intense symptoms on R, impaired VOR gain, disequilibrium, impaired balance and difficulty with walking placing patient at increased risk for falls. Pt would benefit from skilled PT to address these impairments and functional limitations to maximize functional mobility independence and reduce falls risk.    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 Evolving/Moderate complexity    Clinical Decision Making Moderate    Rehab Potential Good    PT  Frequency 1x / week    PT Duration 8 weeks    PT Treatment/Interventions ADLs/Self Care Home Management;Canalith Repostioning;Functional mobility training;Therapeutic activities;Therapeutic exercise;Balance training;Neuromuscular re-education;Patient/family education;Vestibular    PT Next Visit Plan Re-check R BPPV - treat if indicated, check L - treat as indicated.  Check DVA and MSQ, initiate HEP if needed - pt going to [place] - need home epley to perform on trip?    Consulted and Agree with Plan of Care Patient;Family member/caregiver    Family Member Consulted wife             Patient will benefit from skilled therapeutic intervention in order to improve the following deficits and impairments:  Decreased balance, Dizziness, Difficulty walking  Visit Diagnosis: BPPV (benign paroxysmal positional vertigo), bilateral  Dizziness and giddiness  Unsteadiness on feet  Difficulty in walking, not elsewhere classified     Problem List Patient Active Problem List   Diagnosis Date Noted   Transient ischemic attack (TIA) 05/01/2021   Vitreomacular adhesion of both eyes 09/17/2020   OSA (obstructive sleep apnea) 08/08/2020   Snores 08/05/2020   Sleep apnea 08/03/2020   Severe nonproliferative diabetic retinopathy of left eye, with macular edema, associated with type 2 diabetes mellitus ([place]) 08/03/2020   Severe nonproliferative diabetic retinopathy of right eye, with macular edema, associated with type 2 diabetes mellitus ([place]) 08/03/2020   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 10/05/2017   SBO (small bowel obstruction) ([place]) 09/28/2017   HTN (hypertension) 09/28/2017   HLD (hyperlipidemia) 09/28/2017   Type 2 diabetes mellitus with nephropathy ([place]) 09/28/2017   CKD stage 3 secondary to diabetes ([place]) 09/28/2017   AKI (acute kidney injury) ([place]) 09/28/2017   GERD (gastroesophageal reflux disease) 09/28/2017   Vision, loss, sudden 09/23/2014   Amaurosis fugax of right eye  09/09/2014   Benign paroxysmal positional vertigo 09/09/2014   Headache 09/09/2014   Vertigo 09/09/2014    Rico Junker, PT, DPT 05/28/21    9:22 AM     [place] [address] [place], [place], [address] Phone: [phone]   Fax:  [phone]  Name: PERSEUS WESTALL MRN: [id number] Date of Birth: 1945/10/07

## 2021-06-01 ENCOUNTER — Ambulatory Visit: Payer: No Typology Code available for payment source | Admitting: Physical Therapy

## 2021-06-01 ENCOUNTER — Other Ambulatory Visit: Payer: Self-pay

## 2021-06-01 DIAGNOSIS — R42 Dizziness and giddiness: Secondary | ICD-10-CM

## 2021-06-01 DIAGNOSIS — R2681 Unsteadiness on feet: Secondary | ICD-10-CM

## 2021-06-01 DIAGNOSIS — H8113 Benign paroxysmal vertigo, bilateral: Secondary | ICD-10-CM | POA: Diagnosis not present

## 2021-06-01 DIAGNOSIS — R262 Difficulty in walking, not elsewhere classified: Secondary | ICD-10-CM

## 2021-06-01 NOTE — Patient Instructions (Signed)
How to Perform the Epley Maneuver

## 2021-06-02 NOTE — Therapy (Signed)
[place] [facility] [address] [place], [place], [address] Phone: [phone]   Fax:  [phone]  Physical Therapy Treatment  Patient Details  Name: Alex Mccarty MRN: [id number] Date of Birth: [birth year] Referring Provider (PT): Annetta Maw, MD   Encounter Date: 06/01/2021   PT End of Session - 06/01/21 1238     Visit Number 2    Number of Visits 9    Date for PT Re-Evaluation 07/27/21    Authorization Type VA has approved 15 visits to 09/15/21    Authorization - Visit Number 2    Authorization - Number of Visits 15    PT Start Time 1237    PT Stop Time 1316    PT Time Calculation (min) 39 min    Activity Tolerance Patient tolerated treatment well    Behavior During Therapy [facility] for tasks assessed/performed             Past Medical History:  Diagnosis Date   Arthritis    Bowel obstruction ([place])    CKD (chronic kidney disease)    unkknown stage per patient   Diabetes mellitus ([place])    Hyperlipidemia    Hypertension    Kidney stones     Past Surgical History:  Procedure Laterality Date   COLONOSCOPY  10/2015   Dr Kenton Kingfisher   COLOSTOMY REVISION Right 09/29/2017   Procedure: COLON RESECTION RIGHT;  Surgeon: Jovita Kussmaul, MD;  Location: [facility];  Service: General;  Laterality: Right;   ELBOW SURGERY     ulnar nerve repair   ESOPHAGOGASTRODUODENOSCOPY     same time    FOOT SURGERY     crushed heel   LAPAROTOMY N/A 09/29/2017   Procedure: EXPLORATORY LAPAROTOMY;  Surgeon: Jovita Kussmaul, MD;  Location: [facility];  Service: General;  Laterality: N/A;    There were no vitals filed for this visit.   Subjective Assessment - 06/01/21 1239     Subjective Pt felt a little woozy after the evaluation but felt better the next day and was able to bend forward to tie his shoes for about 2 days.  Felt some dizziness this morning.    Patient is accompained by: Family member    Pertinent History old cerebellar  infarcts, OA, CKD, DM, DDD cervical, HTN, lumbar DDD, lumbar radiculopathy, tremor, cataract extraction with intraocular lens implant, R THA, L elbow ligament repair, L rotator cuff repair, shoulder arthoscopy with biceps tenodesis, exposure to agent orange, L carpal tunnel, L ulnar neuropathy, insomnia, OSA    Patient Stated Goals Make the dizziness go away    Currently in Pain? No/denies                     Vestibular Assessment - 06/01/21 1241       Positional Testing   Dix-Hallpike Dix-Hallpike Right;Dix-Hallpike Left      Dix-Hallpike Right   Dix-Hallpike Right Duration 10 seconds    Dix-Hallpike Right Symptoms Other (comment)   R rotary, unable to see vertical component - very mild intensity     Positional Sensitivities   Nose to Right Knee No dizziness    Right Knee to Sitting No dizziness    Nose to Left Knee No dizziness    Left Knee to Sitting No dizziness    Head Turning x 5 No dizziness    Head Nodding x 5 No dizziness    Pivot Right in Standing No dizziness    Pivot Left  in Standing No dizziness                       Vestibular Treatment/Exercise - 06/01/21 1255       Vestibular Treatment/Exercise   Vestibular Treatment Provided Canalith Repositioning    Canalith Repositioning Epley Manuever Right       EPLEY MANUEVER RIGHT   Number of Reps  3    Overall Response Symptoms Resolved    Response Details  no nausea today; also taught pt how to perform home maneuver modified with pillow behind back to perform when pt travels                    PT Education - 06/02/21 0951     Education Details how to perform home Epley modified with pillow for use PRN when traveling    Person(s) Educated Patient;Spouse    Methods Explanation;Demonstration;Handout    Comprehension Verbalized understanding;Returned demonstration              PT Short Term Goals - 05/28/21 0916       PT SHORT TERM GOAL #1   Title Pt will demonstrate  independence with initial HEP/home epley maneuver to allow pt to perform as needed on trip to [place]    Time 4    Period Weeks    Status New    Target Date 06/27/21               PT Long Term Goals - 05/28/21 0917       PT LONG TERM GOAL #1   Title Pt will demonstrate independence with final HEP    Time 8    Period Weeks    Status New    Target Date 07/27/21      PT LONG TERM GOAL #2   Title Pt will report increase on FOTO DPS to >/= 58 and DFS to >/= 50    Baseline 43 and 45.7    Time 8    Period Weeks    Status New    Target Date 07/27/21      PT LONG TERM GOAL #3   Title Pt will demonstrate negative positional testing for R and L side    Baseline R and L BPPV    Time 8    Period Weeks    Status New    Target Date 07/27/21      PT LONG TERM GOAL #4   Title Pt will report 0/5 dizziness when performing all movements on MSQ    Baseline TBD    Time 8    Period Weeks    Status New    Target Date 07/27/21      PT LONG TERM GOAL #5   Title Pt will demonstrate improved VOR gain as indicated by decrease in DVA to 2-3 line difference    Baseline TBD    Time 8    Period Weeks    Status New    Target Date 07/27/21                   Plan - 06/02/21 0944     Clinical Impression Statement Pt demonstrated significant improvement in vertigo and intensity of nystagmus.  Due to ongoing symptoms, treated R BPPV with CRM x 2 with resolution of symptoms and no nausea.  Educated pt on how to perform modified home Epley with pillow in case of return of symptoms while on trip to [place].  Pt able to return demonstrate.  Pt did not demonstrate any motion sensitivity today with testing of MSQ.  Will continue to assess and address as needed when pt returns.    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  Check DVA and initiate HEP.    Consulted and Agree with Plan of Care Patient;Family member/caregiver    Family Member Consulted wife             Patient will benefit from skilled therapeutic intervention in order to improve the following deficits and impairments:  Decreased balance, Dizziness, Difficulty walking  Visit Diagnosis: BPPV (benign paroxysmal positional vertigo), bilateral  Dizziness and giddiness  Unsteadiness on feet  Difficulty in walking, not elsewhere classified     Problem List Patient Active Problem List   Diagnosis Date Noted   Transient ischemic attack (TIA) 05/01/2021   Vitreomacular adhesion of both eyes 09/17/2020   OSA (obstructive sleep apnea) 08/08/2020   Snores 08/05/2020   Sleep apnea 08/03/2020   Severe nonproliferative diabetic retinopathy of left eye, with macular edema, associated with type 2 diabetes mellitus ([place]) 08/03/2020   Severe nonproliferative diabetic retinopathy of right eye, with macular edema, associated with type 2 diabetes mellitus ([place]) 08/03/2020   Cecal volvulus s/p right colectomy  09/29/2017 10/05/2017   SBO (small bowel obstruction) ([place]) 09/28/2017   HTN (hypertension) 09/28/2017   HLD (hyperlipidemia) 09/28/2017   Type 2 diabetes mellitus with nephropathy ([place]) 09/28/2017   CKD stage 3 secondary to diabetes ([place]) 09/28/2017   AKI (acute kidney injury) ([place]) 09/28/2017   GERD (gastroesophageal reflux disease) 09/28/2017   Vision, loss, sudden 09/23/2014   Amaurosis fugax of right eye 09/09/2014   Benign paroxysmal positional vertigo 09/09/2014   Headache 09/09/2014   Vertigo 09/09/2014    Rico Junker, PT, DPT 06/02/21    9:53 AM    [facility] [address] [place], [place], [address] Phone: [phone]   Fax:  [phone]  Name: Alex Mccarty MRN: [id number] Date of Birth: [birth year]

## 2021-06-04 ENCOUNTER — Ambulatory Visit: Payer: No Typology Code available for payment source | Admitting: Physical Therapy

## 2021-06-04 ENCOUNTER — Encounter: Payer: Self-pay | Admitting: Physical Therapy

## 2021-06-04 ENCOUNTER — Other Ambulatory Visit: Payer: Self-pay

## 2021-06-04 VITALS — BP 139/80 | HR 65

## 2021-06-04 DIAGNOSIS — R262 Difficulty in walking, not elsewhere classified: Secondary | ICD-10-CM

## 2021-06-04 DIAGNOSIS — H8113 Benign paroxysmal vertigo, bilateral: Secondary | ICD-10-CM

## 2021-06-04 DIAGNOSIS — R42 Dizziness and giddiness: Secondary | ICD-10-CM

## 2021-06-04 DIAGNOSIS — R2681 Unsteadiness on feet: Secondary | ICD-10-CM

## 2021-06-04 NOTE — Therapy (Signed)
[facility] [address] [place], [place], [address] Phone: [phone]   Fax:  [phone]  Physical Therapy Treatment  Patient Details  Name: Alex Mccarty MRN: [id number] Date of Birth: 08-23-1946 Referring Provider (PT): Annetta Maw, MD   Encounter Date: 06/04/2021   PT End of Session - 06/04/21 0719     Visit Number 3    Number of Visits 9    Date for PT Re-Evaluation 07/27/21    Authorization Type [facility] has approved 15 visits to 09/15/21    Authorization - Visit Number 3    Authorization - Number of Visits 15    PT Start Time 7341    PT Stop Time 0800    PT Time Calculation (min) 43 min    Activity Tolerance Patient tolerated treatment well    Behavior During Therapy [facility] for tasks assessed/performed             Past Medical History:  Diagnosis Date   Arthritis    Bowel obstruction ([place])    CKD (chronic kidney disease)    unkknown stage per patient   Diabetes mellitus ([place])    Hyperlipidemia    Hypertension    Kidney stones     Past Surgical History:  Procedure Laterality Date   COLONOSCOPY  10/2015   Dr Kenton Kingfisher   COLOSTOMY REVISION Right 09/29/2017   Procedure: COLON RESECTION RIGHT;  Surgeon: Jovita Kussmaul, MD;  Location: [facility];  Service: General;  Laterality: Right;   ELBOW SURGERY     ulnar nerve repair   ESOPHAGOGASTRODUODENOSCOPY     same time    FOOT SURGERY     crushed heel   LAPAROTOMY N/A 09/29/2017   Procedure: EXPLORATORY LAPAROTOMY;  Surgeon: Jovita Kussmaul, MD;  Location: [facility];  Service: General;  Laterality: N/A;    Vitals:   06/04/21 0723  BP: 139/80  Pulse: 65     Subjective Assessment - 06/04/21 0720     Subjective Not having spinning but is more off balance, reaching up for a towel, lost balance to the side.  Leaves tomorrow for trip.    Patient is accompained by: Family member    Pertinent History old cerebellar infarcts, OA, CKD, DM, DDD cervical,  HTN, lumbar DDD, lumbar radiculopathy, tremor, cataract extraction with intraocular lens implant, R THA, L elbow ligament repair, L rotator cuff repair, shoulder arthoscopy with biceps tenodesis, exposure to agent orange, L carpal tunnel, L ulnar neuropathy, insomnia, OSA    Patient Stated Goals Make the dizziness go away    Currently in Pain? No/denies                     Vestibular Assessment - 06/04/21 0730       Positional Testing   Dix-Hallpike Dix-Hallpike Left      Dix-Hallpike Left   Dix-Hallpike Left Duration 15    Dix-Hallpike Left Symptoms Downbeat, right rotatory nystagmus   R anterior vs. anterior arm of posterior canal                      Vestibular Treatment/Exercise - 06/04/21 0730       Vestibular Treatment/Exercise   Vestibular Treatment Provided Canalith Repositioning;Gaze    Canalith Repositioning Epley Manuever Left;Comment    Gaze Exercises X1 Viewing Horizontal;X1 Viewing Vertical       EPLEY MANUEVER LEFT   Number of Reps  1    Overall Response  No  change     RESPONSE DETAILS LEFT to treat R anterior vs. anterior arm of posterior canal      OTHER   Comment Forward 360 rotation to treat R AC x 2 reps; when re-tested symptoms had resolved.      X1 Viewing Horizontal   Foot Position seated    Comments 60 seconds; mild symptoms      X1 Viewing Vertical   Foot Position seated    Comments 60 seconds, more difficulty with vertical head movements                    PT Education - 06/04/21 0756     Education Details VOR x1 viewing in sitting added to HEP    Person(s) Educated Patient    Methods Explanation;Demonstration;Handout    Comprehension Verbalized understanding;Returned demonstration              PT Short Term Goals - 05/28/21 0916       PT SHORT TERM GOAL #1   Title Pt will demonstrate independence with initial HEP/home epley maneuver to allow pt to perform as needed on trip to [place]    Time 4     Period Weeks    Status New    Target Date 06/27/21               PT Long Term Goals - 05/28/21 0917       PT LONG TERM GOAL #1   Title Pt will demonstrate independence with final HEP    Time 8    Period Weeks    Status New    Target Date 07/27/21      PT LONG TERM GOAL #2   Title Pt will report increase on FOTO DPS to >/= 58 and DFS to >/= 50    Baseline 43 and 45.7    Time 8    Period Weeks    Status New    Target Date 07/27/21      PT LONG TERM GOAL #3   Title Pt will demonstrate negative positional testing for R and L side    Baseline R and L BPPV    Time 8    Period Weeks    Status New    Target Date 07/27/21      PT LONG TERM GOAL #4   Title Pt will report 0/5 dizziness when performing all movements on MSQ    Baseline TBD    Time 8    Period Weeks    Status New    Target Date 07/27/21      PT LONG TERM GOAL #5   Title Pt will demonstrate improved VOR gain as indicated by decrease in DVA to 2-3 line difference    Baseline TBD    Time 8    Period Weeks    Status New    Target Date 07/27/21                   Plan - 06/06/21 1925     Clinical Impression Statement Pt continuing to experience disequilibrium and requested one more session before trip.  Upon re-assessment pt continued to experience R rotary, downbeating nystagmus of short duration indicating ongoing anterior cana (vs. anterior arm of posterior canal) BPPV; treated with prone 360 rotation with resolution of symptoms.  Initiated VOR training for pt to perform on trip.  Will re-assess symptoms when pt returns.    Personal Factors and Comorbidities Comorbidity 3+;Past/Current Experience  Comorbidities old cerebellar infarcts, OA, CKD, DM, DDD cervical, HTN, lumbar DDD, lumbar radiculopathy, tremor, cataract extraction with intraocular lens implant, R THA, L elbow ligament repair, L rotator cuff repair, shoulder arthoscopy with biceps tenodesis, exposure to agent orange, L carpal tunnel,  L ulnar neuropathy, insomnia, OSA    Examination-Activity Limitations Bed Mobility;Bend;Dressing;Reach Overhead;Locomotion Level    Examination-Participation Restrictions Community Activity;Driving;Other   wood working   [employer] Evolving/Moderate complexity    Rehab Potential Good    PT Frequency 1x / week    PT Duration 8 weeks    PT Treatment/Interventions ADLs/Self Care Home Management;Canalith Repostioning;Functional mobility training;Therapeutic activities;Therapeutic exercise;Balance training;Neuromuscular re-education;Patient/family education;Vestibular    PT Next Visit Plan How was trip to [place]?  Recheck R and L dix-hallpike treat as indicated; how is HEP?    Consulted and Agree with Plan of Care Patient;Family member/caregiver    Family Member Consulted wife             Patient will benefit from skilled therapeutic intervention in order to improve the following deficits and impairments:  Decreased balance, Dizziness, Difficulty walking  Visit Diagnosis: BPPV (benign paroxysmal positional vertigo), bilateral  Dizziness and giddiness  Unsteadiness on feet  Difficulty in walking, not elsewhere classified     Problem List Patient Active Problem List   Diagnosis Date Noted   Transient ischemic attack (TIA) 05/01/2021   Vitreomacular adhesion of both eyes 09/17/2020   OSA (obstructive sleep apnea) 08/08/2020   Snores 08/05/2020   Sleep apnea 08/03/2020   Severe nonproliferative diabetic retinopathy of left eye, with macular edema, associated with type 2 diabetes mellitus ([place]) 08/03/2020   Severe nonproliferative diabetic retinopathy of right eye, with macular edema, associated with type 2 diabetes mellitus ([place]) 08/03/2020   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 10/05/2017   SBO (small bowel obstruction) ([place]) 09/28/2017   HTN (hypertension) 09/28/2017   HLD (hyperlipidemia) 09/28/2017   Type 2 diabetes mellitus with nephropathy  ([place]) 09/28/2017   CKD stage 3 secondary to diabetes ([place]) 09/28/2017   AKI (acute kidney injury) ([place]) 09/28/2017   GERD (gastroesophageal reflux disease) 09/28/2017   Vision, loss, sudden 09/23/2014   Amaurosis fugax of right eye 09/09/2014   Benign paroxysmal positional vertigo 09/09/2014   Headache 09/09/2014   Vertigo 09/09/2014    Rico Junker, PT, DPT 06/06/21    7:30 PM   [facility] [address] [place], [place], [address] Phone: [phone]   Fax:  [phone]  Name: STRYKER VEASEY MRN: [id number] Date of Birth: 23-Dec-1945

## 2021-06-04 NOTE — Patient Instructions (Signed)
Gaze Stabilization: Sitting ? ? ? ?Keeping eyes on target on wall 3 feet away, and move head side to side for _60___ seconds. Repeat while moving head up and down for _60__ seconds. ?Do __2-3__ sessions per day. ? ? ? ?

## 2021-06-09 ENCOUNTER — Encounter (INDEPENDENT_AMBULATORY_CARE_PROVIDER_SITE_OTHER): Payer: HMO | Admitting: Ophthalmology

## 2021-06-15 ENCOUNTER — Other Ambulatory Visit: Payer: Self-pay

## 2021-06-15 ENCOUNTER — Ambulatory Visit (INDEPENDENT_AMBULATORY_CARE_PROVIDER_SITE_OTHER): Payer: No Typology Code available for payment source | Admitting: Ophthalmology

## 2021-06-15 ENCOUNTER — Encounter (INDEPENDENT_AMBULATORY_CARE_PROVIDER_SITE_OTHER): Payer: Self-pay | Admitting: Ophthalmology

## 2021-06-15 DIAGNOSIS — E113411 Type 2 diabetes mellitus with severe nonproliferative diabetic retinopathy with macular edema, right eye: Secondary | ICD-10-CM

## 2021-06-15 DIAGNOSIS — G4733 Obstructive sleep apnea (adult) (pediatric): Secondary | ICD-10-CM | POA: Diagnosis not present

## 2021-06-15 DIAGNOSIS — H43823 Vitreomacular adhesion, bilateral: Secondary | ICD-10-CM

## 2021-06-15 DIAGNOSIS — E113412 Type 2 diabetes mellitus with severe nonproliferative diabetic retinopathy with macular edema, left eye: Secondary | ICD-10-CM

## 2021-06-15 DIAGNOSIS — M25551 Pain in right hip: Secondary | ICD-10-CM | POA: Diagnosis not present

## 2021-06-15 MED ORDER — BEVACIZUMAB 2.5 MG/0.1ML IZ SOSY
2.5000 mg | PREFILLED_SYRINGE | INTRAVITREAL | Status: AC | PRN
  Administered 2021-06-15: 2.5 mg via INTRAVITREAL

## 2021-06-15 NOTE — Assessment & Plan Note (Signed)
CSME center involved OD has improved post Avastin the 9 weeks post treatment, will repeat today to maintain acuity improvement seen today

## 2021-06-15 NOTE — Assessment & Plan Note (Signed)
Moderate vitreomacular adhesion yet done may have outer retinal changes now on the right eye.  These findings were discussed with the patient we will continue to observe the right eye

## 2021-06-15 NOTE — Assessment & Plan Note (Signed)
Again no evidence of sleep apnea by recent testing under direction of Dr. Annamaria Boots November 2021.  Nonetheless patient had years ago a sleep study which was positive, and was on treatment

## 2021-06-15 NOTE — Progress Notes (Signed)
06/15/2021     CHIEF COMPLAINT Patient presents for  Chief Complaint  Patient presents with   Retina Follow Up      HISTORY OF PRESENT ILLNESS: Alex Mccarty is a 75 y.o. male who presents to the clinic today for:   HPI     Retina Follow Up   Patient presents with  Diabetic Retinopathy.  In right eye.  This started 9 weeks ago.  Severity is mild.  Duration of 9 weeks.  Since onset it is stable.        Comments   9 week fu OU and Avastin OD  Pt states VA OU stable since last visit. Pt denies FOL, floaters, or ocular pain OU.  A1C: 6.7 LBS: 100        Last edited by Kendra Opitz, COA on 06/15/2021  8:11 AM.      Referring physician: Elisabeth Cara, PA-C [address] [place],  [place] [address]  HISTORICAL INFORMATION:   Selected notes from the MEDICAL RECORD NUMBER    Lab Results  Component Value Date   HGBA1C 6.6 (H) 05/01/2021     CURRENT MEDICATIONS: No current outpatient medications on file. (Ophthalmic Drugs)   No current facility-administered medications for this visit. (Ophthalmic Drugs)   Current Outpatient Medications (Other)  Medication Sig   amLODipine (NORVASC) 5 MG tablet Take 2.5 mg by mouth daily. (Patient not taking: Reported on 05/28/2021)   atorvastatin (LIPITOR) 80 MG tablet Take 80 mg by mouth at bedtime.    cetirizine (ZYRTEC) 10 MG tablet Take 10 mg by mouth daily as needed for allergies.   Cholecalciferol (VITAMIN D3) 50 MCG (2000 UT) TABS Take 1 tablet by mouth daily.   clopidogrel (PLAVIX) 75 MG tablet Take 1 tablet (75 mg total) by mouth every morning. Take Plavix 75mg  once a day for 21 days (3 weeks) (Patient not taking: Reported on 05/28/2021)   COVID-19 mRNA vaccine, Pfizer, 30 MCG/0.3ML injection INJECT AS DIRECTED   cyclobenzaprine (FLEXERIL) 10 MG tablet Take 10 mg by mouth as needed (muscle pain).    gabapentin (NEURONTIN) 300 MG capsule Take 300 mg by mouth at bedtime.   Glucosamine-Chondroitin  (COSAMIN DS PO) Take 1 capsule by mouth at bedtime.   LANTUS SOLOSTAR 100 UNIT/ML Solostar Pen Inject 45 Units into the skin every morning.    losartan (COZAAR) 100 MG tablet Take 100 mg by mouth every evening.   omeprazole (PRILOSEC) 40 MG capsule Take 40 mg by mouth every morning.   potassium citrate (UROCIT-K) 5 MEQ (540 MG) SR tablet Take 10 mEq by mouth every morning.   propranolol (INDERAL) 20 MG tablet Take 60 mg by mouth every morning.   tiZANidine (ZANAFLEX) 4 MG tablet Take 4 mg by mouth every 6 (six) hours as needed for muscle spasms.   No current facility-administered medications for this visit. (Other)      REVIEW OF SYSTEMS:    ALLERGIES Allergies  Allergen Reactions   Amlodipine Besylate Other (See Comments)    HIVES   Codeine Nausea And Vomiting   Penicillins Other (See Comments)    Childhood reaction Has patient had a PCN reaction causing immediate rash, facial/tongue/throat swelling, SOB or lightheadedness with hypotension: Unknown Has patient had a PCN reaction causing severe rash involving mucus membranes or skin necrosis: Unknown Has patient had a PCN reaction that required hospitalization: Unknown Has patient had a PCN reaction occurring within the last 10 years: Unknown If all of  the above answers are "NO", then may proceed with Cephalosporin use.     Sulfa Antibiotics Nausea And Vomiting    PAST MEDICAL HISTORY Past Medical History:  Diagnosis Date   Arthritis    Bowel obstruction (HCC)    CKD (chronic kidney disease)    unkknown stage per patient   Diabetes mellitus ([place])    Hyperlipidemia    Hypertension    Kidney stones    Past Surgical History:  Procedure Laterality Date   COLONOSCOPY  10/2015   Dr Kenton Kingfisher   COLOSTOMY REVISION Right 09/29/2017   Procedure: COLON RESECTION RIGHT;  Surgeon: Jovita Kussmaul, MD;  Location: [facility];  Service: General;  Laterality: Right;   ELBOW SURGERY     ulnar nerve repair   ESOPHAGOGASTRODUODENOSCOPY      same time    FOOT SURGERY     crushed heel   LAPAROTOMY N/A 09/29/2017   Procedure: EXPLORATORY LAPAROTOMY;  Surgeon: Jovita Kussmaul, MD;  Location: [facility];  Service: General;  Laterality: N/A;    FAMILY HISTORY Family History  Problem Relation Age of Onset   Kidney Stones Mother    Heart disease Mother    Colon cancer Neg Hx    Esophageal cancer Neg Hx     SOCIAL HISTORY Social History   Tobacco Use   Smoking status: Former   Smokeless tobacco: Never   Tobacco comments:    quit over 40 years ago.Around age 60-34   Vaping Use   Vaping Use: Never used  Substance Use Topics   Alcohol use: Yes    Alcohol/week: 0.0 standard drinks    Comment: occasional   Drug use: No         OPHTHALMIC EXAM:  Base Eye Exam     Visual Acuity (ETDRS)       Right Left   Dist cc 20/30 -1 20/20 -2   Dist ph cc NI          Tonometry (Tonopen, 8:56 AM)       Right Left   Pressure 16 16         Pupils       Pupils Dark Light Shape React APD   Right PERRL 4 3 Round Brisk None   Left PERRL 4 3 Round Brisk None         Visual Fields (Counting fingers)       Left Right    Full Full         Extraocular Movement       Right Left    Full Full         Neuro/Psych     Oriented x3: Yes   Mood/Affect: Normal         Dilation     Both eyes: 1.0% Mydriacyl, 2.5% Phenylephrine @ 8:16 AM           Slit Lamp and Fundus Exam     External Exam       Right Left   External Normal Normal         Slit Lamp Exam       Right Left   Lids/Lashes Normal Normal   Conjunctiva/Sclera White and quiet White and quiet   Cornea Clear Clear   Anterior Chamber Deep and quiet Deep and quiet   Iris Round and reactive Round and reactive   Lens Centered posterior chamber intraocular lens Centered posterior chamber intraocular lens   Anterior Vitreous Normal Normal  Fundus Exam       Right Left   Posterior Vitreous Normal,  Normal   Disc Normal  Normal   C/D Ratio 0.3 0.2   Macula Microaneurysms temporally , Mild clinically significant macular edema Microaneurysms, Mild clinically significant macular edema. temporally   Vessels NPDR-Severe NPDR- Moderate   Periphery Normal Normal            IMAGING AND PROCEDURES  Imaging and Procedures for 06/15/21  OCT, Retina - OU - Both Eyes       Right Eye Quality was good. Scan locations included subfoveal. Central Foveal Thickness: 278. Progression has improved. Findings include vitreomacular adhesion .   Left Eye Quality was good. Scan locations included subfoveal. Central Foveal Thickness: 305. Progression has worsened. Findings include cystoid macular edema, vitreomacular adhesion .   Notes  OD, recurrent CSME temporal to the fovea, currently at 2 months postinjection.  Small intraretinal fibrotic area in the parafoveal region probably accounts for his small scotoma noted where 1 letter is missing and per word, and now with outer retinal gap beginning likely associated with vitreomacular try  OS, much improved CSME, small area inferotemporal will observe        Intravitreal Injection, Pharmacologic Agent - OD - Right Eye       Time Out 06/15/2021. 8:58 AM. Confirmed correct patient, procedure, site, and patient consented.   Anesthesia Topical anesthesia was used. Anesthetic medications included [place] 3.5%.   Procedure Preparation included Tobramycin 0.3%, 10% betadine to eyelids, 5% betadine to ocular surface. A 30 gauge needle was used.   Injection: 2.5 mg bevacizumab 2.5 MG/0.1ML   Route: Intravitreal, Site: Right Eye   NDC: [phone]   Post-op Post injection exam found visual acuity of at least counting fingers. The patient tolerated the procedure well. There were no complications. The patient received written and verbal post procedure care education. Post injection medications were not given.              ASSESSMENT/PLAN:  Vitreomacular adhesion  of both eyes Moderate vitreomacular adhesion yet done may have outer retinal changes now on the right eye.  These findings were discussed with the patient we will continue to observe the right eye  Severe nonproliferative diabetic retinopathy of right eye, with macular edema, associated with type 2 diabetes mellitus (HCC) CSME center involved OD has improved post Avastin the 9 weeks post treatment, will repeat today to maintain acuity improvement seen today  Severe nonproliferative diabetic retinopathy of left eye, with macular edema, associated with type 2 diabetes mellitus ([place]) OS with CSME new onset recurrent at 5 months.  We will need to resume therapy in the left eye in the near future, may be able to add focal treatment to this region to prevent recurrence of  OSA (obstructive sleep apnea) Again no evidence of sleep apnea by recent testing under direction of Dr. Annamaria Boots November 2021.  Nonetheless patient had years ago a sleep study which was positive, and was on treatment     ICD-10-CM   1. Severe nonproliferative diabetic retinopathy of right eye, with macular edema, associated with type 2 diabetes mellitus (HCC)  E11.3411 OCT, Retina - OU - Both Eyes    Intravitreal Injection, Pharmacologic Agent - OD - Right Eye    bevacizumab (AVASTIN) SOSY 2.5 mg    2. Vitreomacular adhesion of both eyes  H43.823     3. Severe nonproliferative diabetic retinopathy of left eye, with macular edema, associated with type 2 diabetes mellitus (  [place])  G29.5284     4. OSA (obstructive sleep apnea)  G47.33       1.  OS with new recurrence of CSME will need treatment next in 3 to 4 weeks, currently at 73-month interval  2.  OD currently at 9-week interval vastly improved will repeat injection today and reevaluate next visit consider treatment in the future as needed  3.  Please note patient recently had negative sleep study  Ophthalmic Meds Ordered this visit:  Meds ordered this encounter  Medications    bevacizumab (AVASTIN) SOSY 2.5 mg       Return in about 4 weeks (around 07/13/2021) for dilate, OS, AVASTIN OCT.  There are no Patient Instructions on file for this visit.   Explained the diagnoses, plan, and follow up with the patient and they expressed understanding.  Patient expressed understanding of the importance of proper follow up care.   Clent Demark [place] M.D. Diseases & Surgery of the Retina and Vitreous Retina [facility] Diabetic [place] 06/15/21     Abbreviations: M myopia (nearsighted); A astigmatism; H hyperopia (farsighted); P presbyopia; Mrx spectacle prescription;  CTL contact lenses; OD right eye; OS left eye; OU both eyes  XT exotropia; ET esotropia; PEK punctate epithelial keratitis; PEE punctate epithelial erosions; DES dry eye syndrome; MGD meibomian gland dysfunction; ATs artificial tears; PFAT's preservative free artificial tears; [place] nuclear sclerotic cataract; PSC posterior subcapsular cataract; ERM epi-retinal membrane; PVD posterior vitreous detachment; RD retinal detachment; DM diabetes mellitus; DR diabetic retinopathy; NPDR non-proliferative diabetic retinopathy; PDR proliferative diabetic retinopathy; CSME clinically significant macular edema; DME diabetic macular edema; dbh dot blot hemorrhages; CWS cotton wool spot; POAG primary open angle glaucoma; C/D cup-to-disc ratio; HVF humphrey visual field; GVF goldmann visual field; OCT optical coherence tomography; IOP intraocular pressure; BRVO Branch retinal vein occlusion; CRVO central retinal vein occlusion; CRAO central retinal artery occlusion; BRAO branch retinal artery occlusion; RT retinal tear; SB scleral buckle; PPV pars plana vitrectomy; VH Vitreous hemorrhage; PRP panretinal laser photocoagulation; IVK intravitreal kenalog; VMT vitreomacular traction; MH Macular hole;  NVD neovascularization of the disc; NVE neovascularization elsewhere; AREDS age related eye disease study; ARMD age related macular degeneration;  POAG primary open angle glaucoma; EBMD epithelial/anterior basement membrane dystrophy; ACIOL anterior chamber intraocular lens; IOL intraocular lens; PCIOL posterior chamber intraocular lens; Phaco/IOL phacoemulsification with intraocular lens placement; [place] photorefractive keratectomy; LASIK laser assisted in situ keratomileusis; HTN hypertension; DM diabetes mellitus; COPD chronic obstructive pulmonary disease

## 2021-06-15 NOTE — Assessment & Plan Note (Signed)
OS with CSME new onset recurrent at 5 months.  We will need to resume therapy in the left eye in the near future, may be able to add focal treatment to this region to prevent recurrence of

## 2021-06-18 ENCOUNTER — Ambulatory Visit: Payer: No Typology Code available for payment source | Admitting: Physical Therapy

## 2021-06-18 ENCOUNTER — Other Ambulatory Visit: Payer: Self-pay

## 2021-06-18 DIAGNOSIS — H8113 Benign paroxysmal vertigo, bilateral: Secondary | ICD-10-CM

## 2021-06-18 DIAGNOSIS — R2681 Unsteadiness on feet: Secondary | ICD-10-CM

## 2021-06-18 DIAGNOSIS — R262 Difficulty in walking, not elsewhere classified: Secondary | ICD-10-CM

## 2021-06-18 DIAGNOSIS — R42 Dizziness and giddiness: Secondary | ICD-10-CM

## 2021-06-18 NOTE — Therapy (Signed)
[facility] [address] [place], [place], [address] Phone: [phone]   Fax:  [phone]  Physical Therapy Treatment  Patient Details  Name: Alex Mccarty MRN: [id number] Date of Birth: 05/15/1946 Referring Provider (PT): Annetta Maw, MD   Encounter Date: 06/18/2021   PT End of Session - 06/18/21 0719     Visit Number 4    Number of Visits 9    Date for PT Re-Evaluation 07/27/21    Authorization Type VA has approved 15 visits to 09/15/21    Authorization - Visit Number 4    Authorization - Number of Visits 15    PT Start Time 0717    PT Stop Time 0750    PT Time Calculation (min) 33 min    Activity Tolerance Patient tolerated treatment well    Behavior During Therapy [facility] for tasks assessed/performed             Past Medical History:  Diagnosis Date   Arthritis    Bowel obstruction ([place])    CKD (chronic kidney disease)    unkknown stage per patient   Diabetes mellitus ([place])    Hyperlipidemia    Hypertension    Kidney stones     Past Surgical History:  Procedure Laterality Date   COLONOSCOPY  10/2015   Dr Kenton Kingfisher   COLOSTOMY REVISION Right 09/29/2017   Procedure: COLON RESECTION RIGHT;  Surgeon: Jovita Kussmaul, MD;  Location: [facility];  Service: General;  Laterality: Right;   ELBOW SURGERY     ulnar nerve repair   ESOPHAGOGASTRODUODENOSCOPY     same time    FOOT SURGERY     crushed heel   LAPAROTOMY N/A 09/29/2017   Procedure: EXPLORATORY LAPAROTOMY;  Surgeon: Jovita Kussmaul, MD;  Location: [facility];  Service: General;  Laterality: N/A;    There were no vitals filed for this visit.   Subjective Assessment - 06/18/21 0720     Subjective Had a good trip but didn't get to see much due to rain.  Did well riding in bus and train - no motion sickness but it was very difficult to perform the X exercise in a bus.  Had a little bit of dizziness with lying supine but is now able to bend  forwards without difficulty.    Patient is accompained by: Family member    Pertinent History old cerebellar infarcts, OA, CKD, DM, DDD cervical, HTN, lumbar DDD, lumbar radiculopathy, tremor, cataract extraction with intraocular lens implant, R THA, L elbow ligament repair, L rotator cuff repair, shoulder arthoscopy with biceps tenodesis, exposure to agent orange, L carpal tunnel, L ulnar neuropathy, insomnia, OSA    Patient Stated Goals Make the dizziness go away    Currently in Pain? No/denies                [facility] PT Assessment - 06/18/21 0001       Observation/Other Assessments   Focus on Therapeutic Outcomes (FOTO)  DPS: 62; DFS: 61.5                 Vestibular Assessment - 06/18/21 0721       Positional Testing   Dix-Hallpike Dix-Hallpike Right;Dix-Hallpike Left    Horizontal Canal Testing Horizontal Canal Right;Horizontal Canal Left      Dix-Hallpike Right   Dix-Hallpike Right Duration 0    Dix-Hallpike Right Symptoms No nystagmus      Dix-Hallpike Left   Dix-Hallpike Left Duration 0  Dix-Hallpike Left Symptoms No nystagmus      Horizontal Canal Right   Horizontal Canal Right Duration 0    Horizontal Canal Right Symptoms Normal      Horizontal Canal Left   Horizontal Canal Left Duration 0    Horizontal Canal Left Symptoms Normal      Positional Sensitivities   Sit to Supine No dizziness    Supine to Left Side No dizziness    Supine to Right Side No dizziness    Supine to Sitting No dizziness    Right Hallpike No dizziness    Up from Right Hallpike No dizziness    Up from Left Hallpike No dizziness    Nose to Right Knee No dizziness    Right Knee to Sitting No dizziness    Nose to Left Knee No dizziness    Left Knee to Sitting No dizziness    Head Turning x 5 No dizziness    Head Nodding x 5 No dizziness    Pivot Right in Standing No dizziness    Pivot Left in Standing No dizziness    Rolling Right No dizziness    Rolling Left No dizziness                        Vestibular Treatment/Exercise - 06/18/21 0729       Vestibular Treatment/Exercise   Vestibular Treatment Provided Gaze    Gaze Exercises X1 Viewing Horizontal;X1 Viewing Vertical      X1 Viewing Horizontal   Foot Position standing feet apart, feet together, solid surface    Reps 3    Comments 60 seconds, increased speed.  No dizziness      X1 Viewing Vertical   Foot Position standing feet apart, feet together, solid surface    Reps 3    Comments 60 seconds, increased speed.  No dizziness                    PT Education - 06/18/21 0752     Education Details progress towards goals, resolution of BPPV, plan for next two visits (will cancel and D/C if symptoms don't return) and indications to return to therapy if spinning returns    Person(s) Educated Patient    Methods Explanation    Comprehension Verbalized understanding              PT Short Term Goals - 05/28/21 0916       PT SHORT TERM GOAL #1   Title Pt will demonstrate independence with initial HEP/home epley maneuver to allow pt to perform as needed on trip to [place]    Time 4    Period Weeks    Status New    Target Date 06/27/21               PT Long Term Goals - 06/18/21 0753       PT LONG TERM GOAL #1   Title Pt will demonstrate independence with final HEP    Time 8    Period Weeks    Status Deferred      PT LONG TERM GOAL #2   Title Pt will report increase on FOTO DPS to >/= 58 and DFS to >/= 50    Baseline 43 and 45.7 > 62 and 61.5    Time 8    Period Weeks    Status Achieved      PT LONG TERM GOAL #3   Title Pt will  demonstrate negative positional testing for R and L side    Baseline R and L BPPV    Time 8    Period Weeks    Status Achieved      PT LONG TERM GOAL #4   Title Pt will report 0/5 dizziness when performing all movements on MSQ    Baseline TBD    Time 8    Period Weeks    Status Achieved      PT LONG TERM GOAL #5   Title  Pt will demonstrate improved VOR gain as indicated by decrease in DVA to 2-3 line difference    Baseline TBD    Time 8    Period Weeks    Status Deferred                   Plan - 06/18/21 0754     Clinical Impression Statement Pt demonstrates full resolution of BPPV on R and L side and demonstrates 0/5 dizziness on MSQ.  Progressed VOR training but pt did not experience any symptoms and felt that his imbalance was due to pain in R hip (had injection yesterday).  Pt also has met FOTO goal.  No further visits indicated at this time but remaining 2 visits kept in case symptoms return; if not pt will cancel and PT will proceed with D/C.    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 Evolving/Moderate complexity    Rehab Potential Good    PT Frequency 1x / week    PT Duration 8 weeks    PT Treatment/Interventions ADLs/Self Care Home Management;Canalith Repostioning;Functional mobility training;Therapeutic activities;Therapeutic exercise;Balance training;Neuromuscular re-education;Patient/family education;Vestibular    PT Next Visit Plan Treat if symptoms return, D/C if not    Consulted and Agree with Plan of Care Patient             Patient will benefit from skilled therapeutic intervention in order to improve the following deficits and impairments:  Decreased balance, Dizziness, Difficulty  walking  Visit Diagnosis: BPPV (benign paroxysmal positional vertigo), bilateral  Dizziness and giddiness  Unsteadiness on feet  Difficulty in walking, not elsewhere classified     Problem List Patient Active Problem List   Diagnosis Date Noted   Transient ischemic attack (TIA) 05/01/2021   Vitreomacular adhesion of both eyes 09/17/2020   OSA (obstructive sleep apnea) 08/08/2020   Snores 08/05/2020   Sleep apnea 08/03/2020   Severe nonproliferative diabetic retinopathy of left eye, with macular edema, associated with type 2 diabetes mellitus ([place]) 08/03/2020   Severe nonproliferative diabetic retinopathy of right eye, with macular edema, associated with type 2 diabetes mellitus ([place]) 08/03/2020   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 10/05/2017   SBO (small bowel obstruction) ([place]) 09/28/2017   HTN (hypertension) 09/28/2017   HLD (hyperlipidemia) 09/28/2017   Type 2 diabetes mellitus with nephropathy ([place]) 09/28/2017   CKD stage 3 secondary to diabetes ([place]) 09/28/2017   AKI (acute kidney injury) ([place]) 09/28/2017   GERD (gastroesophageal reflux disease) 09/28/2017   Vision, loss, sudden 09/23/2014   Amaurosis  fugax of right eye 09/09/2014   Benign paroxysmal positional vertigo 09/09/2014   Headache 09/09/2014   Vertigo 09/09/2014    Rico Junker, PT, DPT 06/18/21    7:59 AM    [facility] [address] [place], [place], [address] Phone: [phone]   Fax:  [phone]  Name: Alex Mccarty MRN: [id number] Date of Birth: [birth year]

## 2021-06-20 DIAGNOSIS — M25551 Pain in right hip: Secondary | ICD-10-CM | POA: Diagnosis not present

## 2021-06-24 ENCOUNTER — Ambulatory Visit: Payer: No Typology Code available for payment source | Attending: Internal Medicine | Admitting: Physical Therapy

## 2021-06-24 ENCOUNTER — Encounter: Payer: Self-pay | Admitting: Physical Therapy

## 2021-06-24 ENCOUNTER — Ambulatory Visit (INDEPENDENT_AMBULATORY_CARE_PROVIDER_SITE_OTHER): Payer: No Typology Code available for payment source | Admitting: Gastroenterology

## 2021-06-24 ENCOUNTER — Encounter: Payer: Self-pay | Admitting: Gastroenterology

## 2021-06-24 ENCOUNTER — Other Ambulatory Visit: Payer: Self-pay

## 2021-06-24 VITALS — BP 148/70 | HR 73 | Ht 69.0 in | Wt 185.3 lb

## 2021-06-24 DIAGNOSIS — R2681 Unsteadiness on feet: Secondary | ICD-10-CM | POA: Diagnosis present

## 2021-06-24 DIAGNOSIS — H8113 Benign paroxysmal vertigo, bilateral: Secondary | ICD-10-CM | POA: Insufficient documentation

## 2021-06-24 DIAGNOSIS — R42 Dizziness and giddiness: Secondary | ICD-10-CM | POA: Insufficient documentation

## 2021-06-24 DIAGNOSIS — R262 Difficulty in walking, not elsewhere classified: Secondary | ICD-10-CM | POA: Insufficient documentation

## 2021-06-24 DIAGNOSIS — D490 Neoplasm of unspecified behavior of digestive system: Secondary | ICD-10-CM | POA: Diagnosis not present

## 2021-06-24 DIAGNOSIS — K432 Incisional hernia without obstruction or gangrene: Secondary | ICD-10-CM | POA: Diagnosis not present

## 2021-06-24 NOTE — Patient Instructions (Addendum)
If you are age 73 or older, your body mass index should be between 23-30. Your Body mass index is 27.37 kg/m. If this is out of the aforementioned range listed, please consider follow up with your Primary Care Provider.  If you are age 43 or younger, your body mass index should be between 19-25. Your Body mass index is 27.37 kg/m. If this is out of the aformentioned range listed, please consider follow up with your Primary Care Provider.   __________________________________________________________  The  GI providers would like to encourage you to use [facility] to communicate with providers for non-urgent requests or questions.  Due to long hold times on the telephone, sending your provider a message by [facility] may be a faster and more efficient way to get a response.  Please allow 48 business hours for a response.  Please remember that this is for non-urgent requests.   Next MRI/MRCP due 01-2023. Please call our office 2 months in advance to see how we can go about scheduling this.  Follow up as needed.  Please call with any questions or concerns.  Thank you,  Dr. Jackquline Denmark

## 2021-06-24 NOTE — Therapy (Signed)
[facility] [address] [place], [place], [address] Phone: [phone]   Fax:  [phone]  Physical Therapy Treatment  Patient Details  Name: Alex Mccarty MRN: [id number] Date of Birth: July 20, 1946 Referring Provider (PT): Annetta Maw, MD   Encounter Date: 06/24/2021   PT End of Session - 06/24/21 0747     Visit Number 5    Number of Visits 9    Date for PT Re-Evaluation 07/27/21    Authorization Type VA has approved 15 visits to 09/15/21    Authorization - Visit Number 5    Authorization - Number of Visits 15    PT Start Time 0720    PT Stop Time 0740    PT Time Calculation (min) 20 min    Activity Tolerance Patient tolerated treatment well    Behavior During Therapy [facility] for tasks assessed/performed             Past Medical History:  Diagnosis Date   Arthritis    Bowel obstruction ([place])    CKD (chronic kidney disease)    unkknown stage per patient   Diabetes mellitus ([place])    Hyperlipidemia    Hypertension    Kidney stones     Past Surgical History:  Procedure Laterality Date   COLONOSCOPY  10/2015   Dr Kenton Kingfisher   COLOSTOMY REVISION Right 09/29/2017   Procedure: COLON RESECTION RIGHT;  Surgeon: Jovita Kussmaul, MD;  Location: [facility];  Service: General;  Laterality: Right;   ELBOW SURGERY     ulnar nerve repair   ESOPHAGOGASTRODUODENOSCOPY     same time    FOOT SURGERY     crushed heel   LAPAROTOMY N/A 09/29/2017   Procedure: EXPLORATORY LAPAROTOMY;  Surgeon: Jovita Kussmaul, MD;  Location: [facility];  Service: General;  Laterality: N/A;    There were no vitals filed for this visit.   Subjective Assessment - 06/24/21 0725     Subjective Earlier this week pt was riding in a car and bent down to the floor board to find his keys and felt the spinning come back.  Also felt it when he turned around quickly and when lying supine on the same day.  Since that day pt has not experienced  dizziness/vertigo.  Was able to do yard work without difficulty.    Patient is accompained by: Family member    Pertinent History old cerebellar infarcts, OA, CKD, DM, DDD cervical, HTN, lumbar DDD, lumbar radiculopathy, tremor, cataract extraction with intraocular lens implant, R THA, L elbow ligament repair, L rotator cuff repair, shoulder arthoscopy with biceps tenodesis, exposure to agent orange, L carpal tunnel, L ulnar neuropathy, insomnia, OSA    Patient Stated Goals Make the dizziness go away    Currently in Pain? Yes    Pain Score 5     Pain Location Back    Pain Orientation Right                     Vestibular Assessment - 06/24/21 0728       Positional Testing   Dix-Hallpike Dix-Hallpike Right;Dix-Hallpike Left    Sidelying Test Sidelying Right;Sidelying Left    Horizontal Canal Testing Horizontal Canal Right;Horizontal Canal Left      Dix-Hallpike Right   Dix-Hallpike Right Duration 0    Dix-Hallpike Right Symptoms No nystagmus      Dix-Hallpike Left   Dix-Hallpike Left Duration 0    Dix-Hallpike Left Symptoms No nystagmus  Sidelying Right   Sidelying Right Duration 0    Sidelying Right Symptoms No nystagmus      Sidelying Left   Sidelying Left Duration 0    Sidelying Left Symptoms No nystagmus      Horizontal Canal Right   Horizontal Canal Right Duration 0    Horizontal Canal Right Symptoms Normal      Horizontal Canal Left   Horizontal Canal Left Duration 0    Horizontal Canal Left Symptoms Normal      Positional Sensitivities   Sit to Supine No dizziness    Supine to Left Side No dizziness    Supine to Right Side No dizziness    Supine to Sitting No dizziness    Right Hallpike No dizziness    Up from Right Hallpike No dizziness    Up from Left Hallpike No dizziness    Nose to Right Knee No dizziness    Right Knee to Sitting No dizziness    Nose to Left Knee No dizziness    Left Knee to Sitting No dizziness    Head Turning x 5 No  dizziness    Head Nodding x 5 No dizziness    Pivot Right in Standing No dizziness    Pivot Left in Standing No dizziness    Rolling Right No dizziness    Rolling Left No dizziness                                 PT Short Term Goals - 06/24/21 0751       PT SHORT TERM GOAL #1   Title Pt will demonstrate independence with initial HEP/home epley maneuver to allow pt to perform as needed on trip to [place]    Time 4    Period Weeks    Status Achieved    Target Date 06/27/21               PT Long Term Goals - 06/18/21 0753       PT LONG TERM GOAL #1   Title Pt will demonstrate independence with final HEP    Time 8    Period Weeks    Status Deferred      PT LONG TERM GOAL #2   Title Pt will report increase on FOTO DPS to >/= 58 and DFS to >/= 50    Baseline 43 and 45.7 > 62 and 61.5    Time 8    Period Weeks    Status Achieved      PT LONG TERM GOAL #3   Title Pt will demonstrate negative positional testing for R and L side    Baseline R and L BPPV    Time 8    Period Weeks    Status Achieved      PT LONG TERM GOAL #4   Title Pt will report 0/5 dizziness when performing all movements on MSQ    Baseline TBD    Time 8    Period Weeks    Status Achieved      PT LONG TERM GOAL #5   Title Pt will demonstrate improved VOR gain as indicated by decrease in DVA to 2-3 line difference    Baseline TBD    Time 8    Period Weeks    Status Deferred                   Plan -  06/24/21 0751     Clinical Impression Statement Due to pt reporting return of vertigo earlier this week performed re-assessment of R and L canals as well as MSQ.  Pt did not demonstrate any nystagmus and reported no vertigo or disequilibrium.  Pt may have experienced brief return of BPPV with spontaneous resolution.  Plan to D/C next session if symptoms continue to be resolved; will likely review home Epley one more time to ensure independence.    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    Rehab Potential Good    PT Frequency 1x / week    PT Duration 8 weeks    PT Treatment/Interventions ADLs/Self Care Home Management;Canalith Repostioning;Functional mobility training;Therapeutic activities;Therapeutic exercise;Balance training;Neuromuscular re-education;Patient/family education;Vestibular    PT Next Visit Plan Treat if symptoms return, D/C if not    Consulted and Agree with Plan of Care Patient             Patient will benefit from skilled therapeutic intervention in order to improve the following deficits and impairments:  Decreased balance, Dizziness, Difficulty walking  Visit Diagnosis: BPPV (benign paroxysmal positional vertigo), bilateral  Dizziness and giddiness  Unsteadiness on feet  Difficulty in walking, not elsewhere classified     Problem List Patient Active Problem List   Diagnosis Date Noted   Transient ischemic attack (TIA) 05/01/2021   Vitreomacular adhesion of both eyes 09/17/2020   OSA (obstructive sleep apnea) 08/08/2020   Snores 08/05/2020   Sleep apnea 08/03/2020   Severe nonproliferative diabetic retinopathy of left eye, with macular edema, associated with type 2 diabetes mellitus ([place]) 08/03/2020   Severe nonproliferative diabetic retinopathy of right eye, with macular edema, associated with type 2  diabetes mellitus ([place]) 08/03/2020   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 10/05/2017   SBO (small bowel obstruction) ([place]) 09/28/2017   HTN (hypertension) 09/28/2017   HLD (hyperlipidemia) 09/28/2017   Type 2 diabetes mellitus with nephropathy ([place]) 09/28/2017   CKD stage 3 secondary to diabetes ([place]) 09/28/2017   AKI (acute kidney injury) ([place]) 09/28/2017   GERD (gastroesophageal reflux disease) 09/28/2017   Vision, loss, sudden 09/23/2014   Amaurosis fugax of right eye 09/09/2014   Benign paroxysmal positional vertigo 09/09/2014   Headache 09/09/2014   Vertigo 09/09/2014    Rico Junker, PT, DPT 06/24/21    7:53 AM    [facility] [address] [place], [place], [address] Phone: [phone]   Fax:  [phone]  Name: ALIE HARDGROVE MRN: [id number] Date of Birth: 1946/03/30

## 2021-06-24 NOTE — Progress Notes (Signed)
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  #2. Asymtomatic incisional hernia  Plan: - Recall MRCP 01/2023 as suggested by radiology. - No indication for EUS at this time. - D/W pt and pt's wife extensively. - FU as needed   HPI:    Alex Mccarty is a 75 y.o. male  [facility]  FU MRI abdo without contrast  Feb 09, 2021 at [facility] showing stable 12 mm x 5 mm cystic lesion of pancreatic body/tail junction with direct communication with MPD c/w IPMN.  No solid component or intramural lesions.  MPD mildly dilated at 5.3 mm, stable as compared to MRI 11/2019.  This is likely due to normal anatomic variation.  Patient denies having any GI complaints.  No previous history of recent pancreatitis.  No nausea, vomiting, heartburn, regurgitation, odynophagia or dysphagia.  No significant diarrhea or constipation.  No melena or hematochezia. No unintentional weight loss. No abdominal pain.  No jaundice dark urine or pale stools.  Abn MRI showing ?  1.7 cm lesion in the first portion of the duodenum.  He has been recommended to get EGD performed.  The MRI also shows a 12 mm cystic lesion in the pancreatic body/tail junction suspected side branch duct IPMN.  No nausea, vomiting, heartburn (on omeprazole), regurgitation, odynophagia or dysphagia.  No significant diarrhea or constipation.  No melena or hematochezia. No unintentional weight loss. No abdominal pain.  He had bad experience previously with endoscopic procedures in 2017.  He apparently had EGD and colonoscopy.  Had severe neck problems thereafter which lasted for 3 months requiring physical therapy.  Refuses any endoscopic procedures.  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  He does have incisional and  periumbilical hernia.  Denies having any abdominal pain over the hernia.   Previous MRIs: MRI abdomen without contrast 11/29/2019 Compared with MRI 02/28/2019   -B/L renal cysts.  Stable.  No further follow-up is required. -Duodenal abnormality in the prior study appearance favoring prominence of pylorus. 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  No changes.  MRI 01/2021 Similar findings.   Past Medical History:  Diagnosis Date   Arthritis    Bowel obstruction (HCC)    CKD (chronic kidney disease)    unkknown stage per patient   Diabetes mellitus ([place])    Hyperlipidemia    Hypertension    Kidney stones     Past Surgical History:  Procedure Laterality Date   COLONOSCOPY  10/2015   Dr Kenton Kingfisher   COLOSTOMY REVISION Right 09/29/2017   Procedure: COLON RESECTION RIGHT;  Surgeon: Jovita Kussmaul, MD;  Location: [facility];  Service: General;  Laterality: Right;   ELBOW SURGERY     ulnar nerve repair   ESOPHAGOGASTRODUODENOSCOPY     same time    FOOT SURGERY     crushed heel   LAPAROTOMY N/A 09/29/2017   Procedure: EXPLORATORY LAPAROTOMY;  Surgeon: Jovita Kussmaul, MD;  Location: [facility];  Service: General;  Laterality: N/A;    Family History  Problem Relation Age of Onset   Kidney Stones Mother    Heart disease Mother    Colon cancer Neg Hx    Esophageal  cancer Neg Hx     Social History   Tobacco Use   Smoking status: Former   Smokeless tobacco: Never   Tobacco comments:    quit over 40 years ago.Around age [age]   Vaping Use   Vaping Use: Never used  Substance Use Topics   Alcohol use: Yes    Alcohol/week: 0.0 standard drinks    Comment: occasional   Drug use: No    Current Outpatient Medications  Medication Sig Dispense Refill   aspirin 81 MG EC tablet Take 1 tablet by mouth daily.     atorvastatin (LIPITOR) 80 MG tablet Take 80 mg by mouth at bedtime.      cetirizine (ZYRTEC) 10 MG  tablet Take 10 mg by mouth daily as needed for allergies.     Cholecalciferol (VITAMIN D3) 50 MCG (2000 UT) TABS Take 1 tablet by mouth daily.     COVID-19 mRNA vaccine, Pfizer, 30 MCG/0.3ML injection INJECT AS DIRECTED .3 mL 0   cyclobenzaprine (FLEXERIL) 10 MG tablet Take 10 mg by mouth as needed (muscle pain).      gabapentin (NEURONTIN) 300 MG capsule Take 300 mg by mouth at bedtime.     Glucosamine-Chondroitin (COSAMIN DS PO) Take 1 capsule by mouth at bedtime.     LANTUS SOLOSTAR 100 UNIT/ML Solostar Pen Inject 45 Units into the skin every morning.      losartan (COZAAR) 100 MG tablet Take 100 mg by mouth every evening.     omeprazole (PRILOSEC) 40 MG capsule Take 40 mg by mouth every morning.     potassium citrate (UROCIT-K) 5 MEQ (540 MG) SR tablet Take 3 mEq by mouth every morning.     propranolol (INDERAL) 20 MG tablet Take 80 mg by mouth every morning.     ticagrelor (BRILINTA) 90 MG TABS tablet TAKE ONE TABLET BY MOUTH TWICE A DAY AS DIRECTED * [facility] FOR FURTHER EVALUATION *  REPLACES CLOPIDOGREL AS DIRECTED * [facility]/29/21 FOR FURTHER EVALUATION *  REPLACES CLOPIDOGREL     tiZANidine (ZANAFLEX) 4 MG tablet Take 4 mg by mouth every 6 (six) hours as needed for muscle spasms.     zinc gluconate 50 MG tablet Take 50 mg by mouth daily.     No current facility-administered medications for this visit.    Allergies  Allergen Reactions   Amlodipine Besylate Other (See Comments)    HIVES   Codeine Nausea And Vomiting   Penicillins Other (See Comments)    Childhood reaction Has patient had a PCN reaction causing immediate rash, facial/tongue/throat swelling, SOB or lightheadedness with hypotension: Unknown Has patient had a PCN reaction causing severe rash involving mucus membranes or skin necrosis: Unknown Has patient had a PCN reaction that required hospitalization: Unknown Has patient had a PCN reaction occurring within the last  10 years: Unknown If all of the above answers are "NO", then may proceed with Cephalosporin use.     Sulfa Antibiotics Nausea And Vomiting    Review of Systems:  Constitutional: Denies fever, chills, diaphoresis, appetite change and fatigue.  HEENT: Denies photophobia, eye pain, redness, hearing loss, ear pain, congestion, sore throat, rhinorrhea, sneezing, mouth sores, neck pain, neck stiffness and tinnitus.   Respiratory: Denies SOB, DOE, cough, chest tightness,  and wheezing.   Cardiovascular: Denies chest pain, palpitations and leg swelling.  Genitourinary: Denies dysuria, urgency, frequency, hematuria, flank pain and difficulty urinating.  Musculoskeletal: Denies myalgias, back pain, joint swelling,  arthralgias and gait problem.  Skin: No rash.  Neurological: Denies dizziness, seizures, syncope, weakness, light-headedness, numbness and headaches.  Hematological: Denies adenopathy. Easy bruising, personal or family bleeding history  Psychiatric/Behavioral: No anxiety or depression     Physical Exam:    BP (!) 148/70   Pulse 73   Ht 5\' 9"  (1.753 m)   Wt 185 lb 5 oz (84.1 kg)   SpO2 97%   BMI 27.37 kg/m  Wt Readings from Last 3 Encounters:  06/24/21 185 lb 5 oz (84.1 kg)  05/01/21 185 lb (83.9 kg)  08/08/20 178 lb (80.7 kg)   Constitutional:  Well-developed, in no acute distress. Psychiatric: Normal mood and affect. Behavior is normal. HEENT: Pupils normal.  Conjunctivae are normal. No scleral icterus. Neck supple.  Cardiovascular: Normal rate, regular rhythm. No edema Pulmonary/chest: Effort normal and breath sounds normal. No wheezing, rales or rhonchi. Abdominal: Soft, nondistended. Nontender. Bowel sounds active throughout. There are no masses palpable. No hepatomegaly.  Umbilical hernia.  Incisional hernia. Rectal:  defered Neurological: Alert and oriented to person place and time. Skin: Skin is warm and dry. No rashes noted.  Data Reviewed: I have personally  reviewed following labs and imaging studies  CBC: CBC Latest Ref Rng & Units 05/02/2021 05/01/2021 10/05/2017  WBC 4.0 - 10.5 K/uL 4.6 6.4 6.9  Hemoglobin 13.0 - 17.0 g/dL 11.5(L) 12.6(L) 13.9  Hematocrit 39.0 - 52.0 % 36.3(L) 40.0 41.5  Platelets 150 - 400 K/uL 81(L) 91(L) 92(L)    CMP: CMP Latest Ref Rng & Units 05/03/2021 05/02/2021 05/01/2021  Glucose 70 - 99 mg/dL 134(H) 126(H) 149(H)  BUN 8 - 23 mg/dL 28(H) 25(H) 26(H)  Creatinine 0.61 - 1.24 mg/dL 2.22(H) 2.38(H) 2.41(H)  Sodium 135 - 145 mmol/L 135 137 137  Potassium 3.5 - 5.1 mmol/L 4.0 4.3 4.2  Chloride 98 - 111 mmol/L 107 106 109  CO2 22 - 32 mmol/L 22 24 22   Calcium 8.9 - 10.3 mg/dL 8.4(L) 8.7(L) 8.7(L)  Total Protein 6.5 - 8.1 g/dL - 5.4(L) 6.1(L)  Total Bilirubin 0.3 - 1.2 mg/dL - 0.6 0.8  Alkaline Phos 38 - 126 U/L - 47 49  AST 15 - 41 U/L - 17 20  ALT 0 - 44 U/L - 22 26  Labs from January 2021-creatinine 2.2, glucose 150, platelet 160, normal LFTs   Carmell Austria, MD 06/24/2021, 3:12 PM  Cc: Elisabeth Cara, *

## 2021-07-02 ENCOUNTER — Ambulatory Visit: Payer: No Typology Code available for payment source | Admitting: Physical Therapy

## 2021-07-07 ENCOUNTER — Encounter: Payer: Self-pay | Admitting: Physical Therapy

## 2021-07-07 DIAGNOSIS — N183 Chronic kidney disease, stage 3 unspecified: Secondary | ICD-10-CM | POA: Diagnosis not present

## 2021-07-07 DIAGNOSIS — Z794 Long term (current) use of insulin: Secondary | ICD-10-CM | POA: Diagnosis not present

## 2021-07-07 DIAGNOSIS — M47816 Spondylosis without myelopathy or radiculopathy, lumbar region: Secondary | ICD-10-CM | POA: Diagnosis not present

## 2021-07-07 DIAGNOSIS — I129 Hypertensive chronic kidney disease with stage 1 through stage 4 chronic kidney disease, or unspecified chronic kidney disease: Secondary | ICD-10-CM | POA: Diagnosis not present

## 2021-07-07 DIAGNOSIS — E113393 Type 2 diabetes mellitus with moderate nonproliferative diabetic retinopathy without macular edema, bilateral: Secondary | ICD-10-CM | POA: Diagnosis not present

## 2021-07-07 DIAGNOSIS — M48061 Spinal stenosis, lumbar region without neurogenic claudication: Secondary | ICD-10-CM | POA: Diagnosis not present

## 2021-07-07 DIAGNOSIS — M5136 Other intervertebral disc degeneration, lumbar region: Secondary | ICD-10-CM | POA: Diagnosis not present

## 2021-07-07 NOTE — Therapy (Signed)
[facility] [address] [place], [place], [address] Phone: [phone]   Fax:  [phone]  Patient Details  Name: Alex Mccarty MRN: [id number] Date of Birth: 11-14-1945 Referring Provider:  No ref. provider found  Encounter Date: 07/07/2021  PHYSICAL THERAPY DISCHARGE SUMMARY  Visits from Start of Care: 5  Current functional level related to goals / functional outcomes: Pt demonstrates resolution of BPPV and demonstrates independence with HEP; pt has met all LTG   PT Long Term Goals - 06/18/21 0753       PT LONG TERM GOAL #1   Title Pt will demonstrate independence with final HEP    Time 8    Period Weeks    Status Deferred      PT LONG TERM GOAL #2   Title Pt will report increase on FOTO DPS to >/= 58 and DFS to >/= 50    Baseline 43 and 45.7 > 62 and 61.5    Time 8    Period Weeks    Status Achieved      PT LONG TERM GOAL #3   Title Pt will demonstrate negative positional testing for R and L side    Baseline R and L BPPV    Time 8    Period Weeks    Status Achieved      PT LONG TERM GOAL #4   Title Pt will report 0/5 dizziness when performing all movements on MSQ    Baseline TBD    Time 8    Period Weeks    Status Achieved      PT LONG TERM GOAL #5   Title Pt will demonstrate improved VOR gain as indicated by decrease in DVA to 2-3 line difference    Baseline TBD    Time 8    Period Weeks    Status Deferred               Remaining deficits: Intermittent return of BPPV   Education / Equipment: HEP   Patient agrees to discharge. Patient goals were met. Patient is being discharged due to meeting the stated rehab goals.  Rico Junker, PT, DPT 07/07/21    10:52 AM     [facility] [address] [place], [place], [address] Phone: [phone]   Fax:  [phone]

## 2021-07-13 ENCOUNTER — Encounter (INDEPENDENT_AMBULATORY_CARE_PROVIDER_SITE_OTHER): Payer: HMO | Admitting: Ophthalmology

## 2021-07-20 ENCOUNTER — Other Ambulatory Visit: Payer: Self-pay

## 2021-07-20 ENCOUNTER — Encounter (INDEPENDENT_AMBULATORY_CARE_PROVIDER_SITE_OTHER): Payer: Self-pay | Admitting: Ophthalmology

## 2021-07-20 ENCOUNTER — Ambulatory Visit (INDEPENDENT_AMBULATORY_CARE_PROVIDER_SITE_OTHER): Payer: HMO | Admitting: Ophthalmology

## 2021-07-20 DIAGNOSIS — E113411 Type 2 diabetes mellitus with severe nonproliferative diabetic retinopathy with macular edema, right eye: Secondary | ICD-10-CM | POA: Diagnosis not present

## 2021-07-20 DIAGNOSIS — E113412 Type 2 diabetes mellitus with severe nonproliferative diabetic retinopathy with macular edema, left eye: Secondary | ICD-10-CM

## 2021-07-20 NOTE — Assessment & Plan Note (Addendum)
OS, perifoveal CME from 4 weeks previous has improved.  This could be a contralateral effect from injection Avastin in the fellow eye, the right eyes some 2 weeks previous we will thus observe.  We will not deliver intravitreal Avastin OS today

## 2021-07-20 NOTE — Progress Notes (Signed)
07/20/2021     CHIEF COMPLAINT Patient presents for  Chief Complaint  Patient presents with   Retina Follow Up      HISTORY OF PRESENT ILLNESS: Alex Mccarty is a 75 y.o. male who presents to the clinic today for:   HPI     Retina Follow Up   Patient presents with  Diabetic Retinopathy.  In left eye.  This started 4 weeks ago.  Severity is mild.  Duration of 4 weeks.  Since onset it is stable.        Comments   4 week fu OS oct avastin OS. Patient states vision is stable and unchanged since last visit. Denies any new floaters or FOL.       Last edited by Laurin Coder on 07/20/2021  2:23 PM.      Referring physician: Elisabeth Cara, PA-C [address] [place],  [place] [address]  HISTORICAL INFORMATION:   Selected notes from the MEDICAL RECORD NUMBER    Lab Results  Component Value Date   HGBA1C 6.6 (H) 05/01/2021     CURRENT MEDICATIONS: No current outpatient medications on file. (Ophthalmic Drugs)   No current facility-administered medications for this visit. (Ophthalmic Drugs)   Current Outpatient Medications (Other)  Medication Sig   aspirin 81 MG EC tablet Take 1 tablet by mouth daily.   atorvastatin (LIPITOR) 80 MG tablet Take 80 mg by mouth at bedtime.    cetirizine (ZYRTEC) 10 MG tablet Take 10 mg by mouth daily as needed for allergies.   Cholecalciferol (VITAMIN D3) 50 MCG (2000 UT) TABS Take 1 tablet by mouth daily.   COVID-19 mRNA vaccine, Pfizer, 30 MCG/0.3ML injection INJECT AS DIRECTED   cyclobenzaprine (FLEXERIL) 10 MG tablet Take 10 mg by mouth as needed (muscle pain).    gabapentin (NEURONTIN) 300 MG capsule Take 300 mg by mouth at bedtime.   Glucosamine-Chondroitin (COSAMIN DS PO) Take 1 capsule by mouth at bedtime.   LANTUS SOLOSTAR 100 UNIT/ML Solostar Pen Inject 45 Units into the skin every morning.    losartan (COZAAR) 100 MG tablet Take 100 mg by mouth every evening.   omeprazole (PRILOSEC) 40 MG capsule  Take 40 mg by mouth every morning.   potassium citrate (UROCIT-K) 5 MEQ (540 MG) SR tablet Take 3 mEq by mouth every morning.   propranolol (INDERAL) 20 MG tablet Take 80 mg by mouth every morning.   ticagrelor (BRILINTA) 90 MG TABS tablet TAKE ONE TABLET BY MOUTH TWICE A DAY AS DIRECTED * [facility] FOR FURTHER EVALUATION *  REPLACES CLOPIDOGREL AS DIRECTED * [facility] 06/29/21 FOR FURTHER EVALUATION *  REPLACES CLOPIDOGREL   tiZANidine (ZANAFLEX) 4 MG tablet Take 4 mg by mouth every 6 (six) hours as needed for muscle spasms.   zinc gluconate 50 MG tablet Take 50 mg by mouth daily.   No current facility-administered medications for this visit. (Other)      REVIEW OF SYSTEMS:    ALLERGIES Allergies  Allergen Reactions   Amlodipine Besylate Other (See Comments)    HIVES   Codeine Nausea And Vomiting   Penicillins Other (See Comments)    Childhood reaction Has patient had a PCN reaction causing immediate rash, facial/tongue/throat swelling, SOB or lightheadedness with hypotension: Unknown Has patient had a PCN reaction causing severe rash involving mucus membranes or skin necrosis: Unknown Has patient had a PCN reaction that required hospitalization: Unknown Has patient had  a PCN reaction occurring within the last 10 years: Unknown If all of the above answers are "NO", then may proceed with Cephalosporin use.     Sulfa Antibiotics Nausea And Vomiting    PAST MEDICAL HISTORY Past Medical History:  Diagnosis Date   Arthritis    Bowel obstruction (HCC)    CKD (chronic kidney disease)    unkknown stage per patient   Diabetes mellitus ([place])    Hyperlipidemia    Hypertension    Kidney stones    Past Surgical History:  Procedure Laterality Date   COLONOSCOPY  10/2015   Dr Kenton Kingfisher   COLOSTOMY REVISION Right 09/29/2017   Procedure: COLON RESECTION RIGHT;  Surgeon: Jovita Kussmaul, MD;  Location: [facility];  Service: General;   Laterality: Right;   ELBOW SURGERY     ulnar nerve repair   ESOPHAGOGASTRODUODENOSCOPY     same time    FOOT SURGERY     crushed heel   LAPAROTOMY N/A 09/29/2017   Procedure: EXPLORATORY LAPAROTOMY;  Surgeon: Jovita Kussmaul, MD;  Location: [facility];  Service: General;  Laterality: N/A;    FAMILY HISTORY Family History  Problem Relation Age of Onset   Kidney Stones Mother    Heart disease Mother    Colon cancer Neg Hx    Esophageal cancer Neg Hx     SOCIAL HISTORY Social History   Tobacco Use   Smoking status: Former   Smokeless tobacco: Never   Tobacco comments:    quit over 40 years ago.Around age [age]   Vaping Use   Vaping Use: Never used  Substance Use Topics   Alcohol use: Yes    Alcohol/week: 0.0 standard drinks    Comment: occasional   Drug use: No         OPHTHALMIC EXAM:  Base Eye Exam     Visual Acuity (ETDRS)       Right Left   Dist cc 20/30 -1 20/20    Correction: Glasses         Tonometry (Tonopen, 2:27 PM)       Right Left   Pressure 9 12         Pupils       Pupils Dark Light APD   Right PERRL 4 3 None   Left PERRL 4 3 None         Extraocular Movement       Right Left    Full Full         Neuro/Psych     Oriented x3: Yes   Mood/Affect: Normal         Dilation     Left eye: 1.0% Mydriacyl, 2.5% Phenylephrine @ 2:27 PM           Slit Lamp and Fundus Exam     External Exam       Right Left   External Normal Normal         Slit Lamp Exam       Right Left   Lids/Lashes Normal Normal   Conjunctiva/Sclera White and quiet White and quiet   Cornea Clear Clear   Anterior Chamber Deep and quiet Deep and quiet   Iris Round and reactive Round and reactive   Lens Centered posterior chamber intraocular lens Centered posterior chamber intraocular lens   Anterior Vitreous Normal Normal         Fundus Exam       Right Left   Posterior Vitreous  Normal  Disc  Normal   C/D Ratio  0.2   Macula   Microaneurysms, Mild clinically significant macular edema. temporally   Vessels  NPDR- Moderate   Periphery  Normal            IMAGING AND PROCEDURES  Imaging and Procedures for 07/20/21  OCT, Retina - OU - Both Eyes       Right Eye Quality was good. Scan locations included subfoveal. Central Foveal Thickness: 262. Progression has improved. Findings include vitreomacular adhesion .   Left Eye Quality was good. Scan locations included subfoveal. Central Foveal Thickness: 287. Progression has worsened. Findings include cystoid macular edema, vitreomacular adhesion .   Notes  OD, recurrent CSME temporal to the fovea, currently at 2 months postinjection.  Small intraretinal fibrotic area in the parafoveal region probably accounts for his small scotoma noted where 1 letter is missing and per word, and now with outer retinal gap beginning likely associated with vitreomacular traction  OS, much improved CSME, small area inferotemporal will observe                ASSESSMENT/PLAN:  Severe nonproliferative diabetic retinopathy of left eye, with macular edema, associated with type 2 diabetes mellitus (HCC) OS, perifoveal CME from 4 weeks previous has improved.  This could be a contralateral effect from injection Avastin in the fellow eye, the right eyes some 2 weeks previous we will thus observe.  We will not deliver intravitreal Avastin OS today  Severe nonproliferative diabetic retinopathy of right eye, with macular edema, associated with type 2 diabetes mellitus (HCC) OD improved post Avastin injection some 4 weeks previous follow-up as scheduled     ICD-10-CM   1. Severe nonproliferative diabetic retinopathy of left eye, with macular edema, associated with type 2 diabetes mellitus (HCC)  F02.7741 OCT, Retina - OU - Both Eyes    CANCELED: Intravitreal Injection, Pharmacologic Agent - OS - Left Eye    2. Severe nonproliferative diabetic retinopathy of right eye, with macular  edema, associated with type 2 diabetes mellitus ([place])  E11.3411       1.  2.  3.  Ophthalmic Meds Ordered this visit:  No orders of the defined types were placed in this encounter.      Return in about 6 weeks (around 08/31/2021) for DILATE OU, OCT.  There are no Patient Instructions on file for this visit.   Explained the diagnoses, plan, and follow up with the patient and they expressed understanding.  Patient expressed understanding of the importance of proper follow up care.   Clent Demark Takia Runyon M.D. Diseases & Surgery of the Retina and Vitreous [facility] [place] 07/20/21     Abbreviations: M myopia (nearsighted); A astigmatism; H hyperopia (farsighted); P presbyopia; Mrx spectacle prescription;  CTL contact lenses; OD right eye; OS left eye; OU both eyes  XT exotropia; ET esotropia; PEK punctate epithelial keratitis; PEE punctate epithelial erosions; DES dry eye syndrome; MGD meibomian gland dysfunction; ATs artificial tears; PFAT's preservative free artificial tears; [place] nuclear sclerotic cataract; PSC posterior subcapsular cataract; ERM epi-retinal membrane; PVD posterior vitreous detachment; RD retinal detachment; DM diabetes mellitus; DR diabetic retinopathy; NPDR non-proliferative diabetic retinopathy; PDR proliferative diabetic retinopathy; CSME clinically significant macular edema; DME diabetic macular edema; dbh dot blot hemorrhages; CWS cotton wool spot; POAG primary open angle glaucoma; C/D cup-to-disc ratio; HVF humphrey visual field; GVF goldmann visual field; OCT optical coherence tomography; IOP intraocular pressure; BRVO Branch retinal vein occlusion; CRVO central retinal vein occlusion; CRAO  central retinal artery occlusion; BRAO branch retinal artery occlusion; RT retinal tear; SB scleral buckle; PPV pars plana vitrectomy; VH Vitreous hemorrhage; PRP panretinal laser photocoagulation; IVK intravitreal kenalog; VMT vitreomacular traction; MH Macular hole;   NVD neovascularization of the disc; NVE neovascularization elsewhere; AREDS age related eye disease study; ARMD age related macular degeneration; POAG primary open angle glaucoma; EBMD epithelial/anterior basement membrane dystrophy; ACIOL anterior chamber intraocular lens; IOL intraocular lens; PCIOL posterior chamber intraocular lens; Phaco/IOL phacoemulsification with intraocular lens placement; [place] photorefractive keratectomy; LASIK laser assisted in situ keratomileusis; HTN hypertension; DM diabetes mellitus; COPD chronic obstructive pulmonary disease

## 2021-07-20 NOTE — Assessment & Plan Note (Addendum)
OD improved post Avastin injection some 4 weeks previous follow-up as scheduled

## 2021-08-02 ENCOUNTER — Ambulatory Visit: Payer: No Typology Code available for payment source | Attending: Internal Medicine | Admitting: Physical Therapy

## 2021-08-02 ENCOUNTER — Other Ambulatory Visit: Payer: Self-pay

## 2021-08-02 DIAGNOSIS — R42 Dizziness and giddiness: Secondary | ICD-10-CM | POA: Insufficient documentation

## 2021-08-02 DIAGNOSIS — H8113 Benign paroxysmal vertigo, bilateral: Secondary | ICD-10-CM | POA: Insufficient documentation

## 2021-08-02 DIAGNOSIS — R2681 Unsteadiness on feet: Secondary | ICD-10-CM | POA: Diagnosis present

## 2021-08-03 NOTE — Therapy (Signed)
[facility] [address] [place], [place], [address] Phone: [phone]   Fax:  [phone]  Physical Therapy Evaluation  Patient Details  Name: Alex Mccarty MRN: [id number] Date of Birth: 12/05/1945 Referring Provider (PT): Annetta Maw, MD   Encounter Date: 08/02/2021   PT End of Session - 08/03/21 0953     Visit Number 1    Number of Visits 9    Date for PT Re-Evaluation 09/15/21    Authorization Type VA has approved 15 visits to 09/15/21    Authorization - Visit Number 6    Authorization - Number of Visits 15    PT Start Time 5852    PT Stop Time 7782    PT Time Calculation (min) 45 min    Activity Tolerance Patient tolerated treatment well    Behavior During Therapy [facility] for tasks assessed/performed             Past Medical History:  Diagnosis Date   Arthritis    Bowel obstruction ([place])    CKD (chronic kidney disease)    unkknown stage per patient   Diabetes mellitus ([place])    Hyperlipidemia    Hypertension    Kidney stones     Past Surgical History:  Procedure Laterality Date   COLONOSCOPY  10/2015   Dr Kenton Kingfisher   COLOSTOMY REVISION Right 09/29/2017   Procedure: COLON RESECTION RIGHT;  Surgeon: Jovita Kussmaul, MD;  Location: [facility];  Service: General;  Laterality: Right;   ELBOW SURGERY     ulnar nerve repair   ESOPHAGOGASTRODUODENOSCOPY     same time    FOOT SURGERY     crushed heel   LAPAROTOMY N/A 09/29/2017   Procedure: EXPLORATORY LAPAROTOMY;  Surgeon: Jovita Kussmaul, MD;  Location: [facility];  Service: General;  Laterality: N/A;    There were no vitals filed for this visit.    Subjective Assessment - 08/02/21 1535     Subjective Pt reports he has been doing really well but one day he dropped his keys under his car and when he bent down to look under the car for him he felt dizzy when he stood back up.  Pt kept going backwards, didn't fall but backed into a pole.  Also  happened at the grocery store.  Mostly only happens when he bends down.    Patient is accompained by: Family member    Pertinent History old cerebellar infarcts, small bowel obstruction, OA, CKD, DM, DDD cervical, HTN, lumbar DDD, lumbar radiculopathy, tremor, cataract extraction with intraocular lens implant, R THA, L elbow ligament repair, L rotator cuff repair, shoulder arthoscopy with biceps tenodesis, exposure to agent orange, L carpal tunnel, L ulnar neuropathy, insomnia, OSA    Limitations Other (comment)   bending   Patient Stated Goals Make the dizziness go away    Currently in Pain? No/denies                [facility] PT Assessment - 08/02/21 1538       Assessment   Medical Diagnosis Recurrent Vertigo    Referring Provider (PT) Annetta Maw, MD    Onset Date/Surgical Date 05/01/21    Hand Dominance Right    Prior Therapy yes for vertigo      Precautions   Precautions Other (comment)    Precaution Comments old cerebellar infarcts, OA, CKD, DM, DDD cervical, HTN, lumbar DDD, lumbar radiculopathy, tremor, cataract extraction with intraocular lens implant, R THA, L elbow  ligament repair, L rotator cuff repair, shoulder arthoscopy with biceps tenodesis, exposure to agent orange, L carpal tunnel, L ulnar neuropathy, insomnia, OSA, SBO      Balance Screen   Has the patient fallen in the past 6 months No      [place] residence    Living Arrangements Spouse/significant other    Additional Comments No issues with driving      Prior Function   Level of Independence Independent      Observation/Other Assessments   Focus on Therapeutic Outcomes (FOTO)  Not Assessed      Ambulation/Gait   Ambulation/Gait Yes    Ambulation/Gait Assistance 7: Independent    Gait Pattern Within Functional Limits                    Vestibular Assessment - 08/02/21 1540       Symptom Behavior   Subjective history of current problem No  dizziness at rest; felt return of dizziness with bending forwards to the ground    Type of Dizziness  "Funny feeling in head";Imbalance    Frequency of Dizziness intermittent    Duration of Dizziness seconds    Symptom Nature Motion provoked;Positional    Aggravating Factors Forward bending    Relieving Factors Comments   standing upright   Progression of Symptoms Better      Oculomotor Exam   Oculomotor Alignment Normal    Spontaneous Absent    Gaze-induced  Absent    Smooth Pursuits Intact    Saccades Intact      Oculomotor Exam-Fixation Suppressed    Left Head Impulse negative    Right Head Impulse negative      Vestibulo-Ocular Reflex   VOR to Slow Head Movement Normal    VOR Cancellation Normal      Positional Testing   Dix-Hallpike Dix-Hallpike Right;Dix-Hallpike Left    Horizontal Canal Testing Horizontal Canal Right;Horizontal Canal Left      Dix-Hallpike Right   Dix-Hallpike Right Duration 60 seconds    Dix-Hallpike Right Symptoms Left nystagmus      Dix-Hallpike Left   Dix-Hallpike Left Duration 60 seconds    Dix-Hallpike Left Symptoms Downbeat, right rotatory nystagmus      Horizontal Canal Right   Horizontal Canal Right Symptoms Other (comment)   nystagmus but mild and unable to determine direction     Horizontal Canal Left   Horizontal Canal Left Symptoms Other (comment)   nystagmus but mild and unable to determine direction               Objective measurements completed on examination: See above findings.        Vestibular Treatment/Exercise - 08/02/21 1556       Vestibular Treatment/Exercise   Vestibular Treatment Provided Canalith Repositioning      OTHER   Comment Forward 360 rotation to treat R AC x 3 reps                    PT Education - 08/03/21 0953     Education Details clinical findings, return of R BPPV    Person(s) Educated Patient    Methods Explanation    Comprehension Verbalized understanding                PT Long Term Goals - 08/03/21 1130       PT LONG TERM GOAL #1   Title Pt will demonstrate independence with final HEP and home  manuever    Time 4    Period Weeks    Status New    Target Date 09/02/21      PT LONG TERM GOAL #2   Title Pt will report no dizziness when bending down to the ground to retrieve items    Time 4    Period Weeks    Status New    Target Date 09/02/21      PT LONG TERM GOAL #3   Title Pt will demonstrate negative positional testing for R and L side    Baseline R anterior canal BPPV    Time 4    Period Weeks    Status New    Target Date 09/02/21                    Plan - 08/03/21 0959     Clinical Impression Statement Pt is a 75 year old male well known to this therapist who was referred to Neuro OPPT for evaluation of recurrent vertigo after TIA and COVID-19 infection.  Pt was previously diagnosed with bilat BPPV, treated successfully in 5 sessions.  Pt now returns with recurrent vertigo after bending down to the ground.  Pt demonstrates return of vertigo and downbeat, R torsional nystagmus of short duration during Hallpike-dix testing indicating return of R anterior canal BPPV.  Pt would benefit from skilled PT to address these impairments and functional limitations to maximize functional mobility independence and reduce falls risk.    Personal Factors and Comorbidities Comorbidity 3+;Past/Current Experience    Comorbidities old cerebellar infarcts, OA, CKD, DM, DDD cervical, HTN, lumbar DDD, lumbar radiculopathy, tremor, cataract extraction with intraocular lens implant, R THA, L elbow ligament repair, L rotator cuff repair, shoulder arthoscopy with biceps tenodesis, exposure to agent orange, L carpal tunnel, L ulnar neuropathy, insomnia, OSA    Examination-Activity Limitations Bend;Dressing;Locomotion Level;Stand    Examination-Participation Restrictions Community Activity;Other   wood working   Stability/Clinical Decision Making  Stable/Uncomplicated    Clinical Decision Making Low    Rehab Potential Good    PT Frequency 2x / week    PT Duration 6 weeks    PT Treatment/Interventions ADLs/Self Care Home Management;Canalith Repostioning;Functional mobility training;Therapeutic activities;Therapeutic exercise;Balance training;Neuromuscular re-education;Patient/family education;Vestibular    PT Next Visit Plan Recheck L and R hallpike - treat as indicated - anterior canal 360 roll or reverse liberatory manuever.  Re-check horizontal canals once ant canal is cleared.    Consulted and Agree with Plan of Care Patient             Patient will benefit from skilled therapeutic intervention in order to improve the following deficits and impairments:  Decreased balance, Dizziness  Visit Diagnosis: BPPV (benign paroxysmal positional vertigo), bilateral - Plan: PT plan of care cert/re-cert  Dizziness and giddiness - Plan: PT plan of care cert/re-cert  Unsteadiness on feet - Plan: PT plan of care cert/re-cert     Problem List Patient Active Problem List   Diagnosis Date Noted   Transient ischemic attack (TIA) 05/01/2021   Vitreomacular adhesion of both eyes 09/17/2020   OSA (obstructive sleep apnea) 08/08/2020   Snores 08/05/2020   Sleep apnea 08/03/2020   Severe nonproliferative diabetic retinopathy of left eye, with macular edema, associated with type 2 diabetes mellitus ([place]) 08/03/2020   Severe nonproliferative diabetic retinopathy of right eye, with macular edema, associated with type 2 diabetes mellitus ([place]) 08/03/2020   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 10/05/2017   SBO (small bowel  obstruction) ([place]) 09/28/2017   HTN (hypertension) 09/28/2017   HLD (hyperlipidemia) 09/28/2017   Type 2 diabetes mellitus with nephropathy ([place]) 09/28/2017   CKD stage 3 secondary to diabetes ([place]) 09/28/2017   AKI (acute kidney injury) ([place]) 09/28/2017   GERD (gastroesophageal reflux disease) 09/28/2017   Vision,  loss, sudden 09/23/2014   Amaurosis fugax of right eye 09/09/2014   Benign paroxysmal positional vertigo 09/09/2014   Headache 09/09/2014   Vertigo 09/09/2014    Rico Junker, PT, DPT 08/03/21    11:36 AM    [facility] [address] [place], [place], [address] Phone: [phone]   Fax:  [phone]  Name: CONNIE HILGERT MRN: [id number] Date of Birth: [birth year]

## 2021-08-13 DIAGNOSIS — M48061 Spinal stenosis, lumbar region without neurogenic claudication: Secondary | ICD-10-CM | POA: Diagnosis not present

## 2021-08-19 ENCOUNTER — Other Ambulatory Visit: Payer: Self-pay

## 2021-08-19 ENCOUNTER — Ambulatory Visit: Payer: No Typology Code available for payment source | Attending: Internal Medicine | Admitting: Physical Therapy

## 2021-08-19 DIAGNOSIS — H8113 Benign paroxysmal vertigo, bilateral: Secondary | ICD-10-CM | POA: Diagnosis not present

## 2021-08-19 DIAGNOSIS — R42 Dizziness and giddiness: Secondary | ICD-10-CM | POA: Diagnosis present

## 2021-08-19 NOTE — Therapy (Signed)
[facility] [address] [place], [place], [address] Phone: [phone]   Fax:  [phone]  Physical Therapy Treatment  Patient Details  Name: Alex Mccarty MRN: [id number] Date of Birth: 1946/07/12 Referring Provider (PT): Annetta Maw, MD   Encounter Date: 08/19/2021   PT End of Session - 08/19/21 0958     Visit Number 2    Number of Visits 9    Date for PT Re-Evaluation 09/15/21    Authorization Type VA has approved 15 visits to 09/15/21    Authorization - Visit Number 7    Authorization - Number of Visits 15    PT Start Time 0930    PT Stop Time 0950    PT Time Calculation (min) 20 min    Activity Tolerance Patient tolerated treatment well    Behavior During Therapy [facility] for tasks assessed/performed             Past Medical History:  Diagnosis Date   Arthritis    Bowel obstruction ([place])    CKD (chronic kidney disease)    unkknown stage per patient   Diabetes mellitus ([place])    Hyperlipidemia    Hypertension    Kidney stones     Past Surgical History:  Procedure Laterality Date   COLONOSCOPY  10/2015   Dr Kenton Kingfisher   COLOSTOMY REVISION Right 09/29/2017   Procedure: COLON RESECTION RIGHT;  Surgeon: Jovita Kussmaul, MD;  Location: [facility];  Service: General;  Laterality: Right;   ELBOW SURGERY     ulnar nerve repair   ESOPHAGOGASTRODUODENOSCOPY     same time    FOOT SURGERY     crushed heel   LAPAROTOMY N/A 09/29/2017   Procedure: EXPLORATORY LAPAROTOMY;  Surgeon: Jovita Kussmaul, MD;  Location: [facility];  Service: General;  Laterality: N/A;    There were no vitals filed for this visit.   Subjective Assessment - 08/19/21 0935     Subjective Has been having bad back issues - wasn't even able to walk; had to have an MRI and an injection in his back.  Feeling better today.  Still having periodic dizziness; was taking pain medication that was making the dizziness worse.    Patient is  accompained by: Family member    Pertinent History old cerebellar infarcts, small bowel obstruction, OA, CKD, DM, DDD cervical, HTN, lumbar DDD, lumbar radiculopathy, tremor, cataract extraction with intraocular lens implant, R THA, L elbow ligament repair, L rotator cuff repair, shoulder arthoscopy with biceps tenodesis, exposure to agent orange, L carpal tunnel, L ulnar neuropathy, insomnia, OSA    Limitations Other (comment)   bending   Patient Stated Goals Make the dizziness go away    Currently in Pain? No/denies               Vestibular Assessment - 08/19/21 0937       Symptom Behavior   Subjective history of current problem Dizziness was better after treatment; still getting dizzy when bending forwards but not as bad.  Had to have ears cleaned out to be fit for hearing aides.      Positional Testing   Dix-Hallpike Dix-Hallpike Left    Sidelying Test Sidelying Right;Sidelying Left    Horizontal Canal Testing Horizontal Canal Right;Horizontal Canal Left      Dix-Hallpike Left   Dix-Hallpike Left Duration 0    Dix-Hallpike Left Symptoms No nystagmus      Sidelying Right   Sidelying Right Duration 0  Sidelying Right Symptoms No nystagmus      Sidelying Left   Sidelying Left Duration 0    Sidelying Left Symptoms No nystagmus      Horizontal Canal Right   Horizontal Canal Right Duration 0    Horizontal Canal Right Symptoms Normal      Horizontal Canal Left   Horizontal Canal Left Duration 0    Horizontal Canal Left Symptoms Normal      Positional Sensitivities   Sit to Supine No dizziness    Supine to Left Side No dizziness    Supine to Right Side No dizziness    Supine to Sitting No dizziness    Right Hallpike No dizziness    Up from Right Hallpike No dizziness    Up from Left Hallpike No dizziness    Nose to Right Knee No dizziness    Right Knee to Sitting No dizziness    Nose to Left Knee No dizziness    Left Knee to Sitting No dizziness    Head Nodding x  5 No dizziness    Rolling Right No dizziness    Rolling Left No dizziness                               PT Education - 08/19/21 0957     Education Details No indication of ongoing BPPV today; plan to keep appointments and assess week to week if pt will need treatment    Person(s) Educated Patient    Methods Explanation    Comprehension Verbalized understanding              PT Short Term Goals - 06/24/21 0751       PT SHORT TERM GOAL #1   Title Pt will demonstrate independence with initial HEP/home epley maneuver to allow pt to perform as needed on trip to [place]    Time 4    Period Weeks    Status Achieved    Target Date 06/27/21               PT Long Term Goals - 08/03/21 1130       PT LONG TERM GOAL #1   Title Pt will demonstrate independence with final HEP and home manuever    Time 4    Period Weeks    Status New    Target Date 09/02/21      PT LONG TERM GOAL #2   Title Pt will report no dizziness when bending down to the ground to retrieve items    Time 4    Period Weeks    Status New    Target Date 09/02/21      PT LONG TERM GOAL #3   Title Pt will demonstrate negative positional testing for R and L side    Baseline R anterior canal BPPV    Time 4    Period Weeks    Status New    Target Date 09/02/21                   Plan - 08/19/21 0959     Clinical Impression Statement Pt demonstrates full resolution of BPPV today and no evidence of ongoing motion sensitivity with bending down to the ground or with vertical head movements.  Plan to assess on a week by week basis and if symptoms return, will have pt return to clinic for assessment and treatment.  Assessment today did not  cause any increased pain or symptoms in low back.    Personal Factors and Comorbidities Comorbidity 3+;Past/Current Experience    Comorbidities old cerebellar infarcts, OA, CKD, DM, DDD cervical, HTN, lumbar DDD, lumbar radiculopathy, tremor,  cataract extraction with intraocular lens implant, R THA, L elbow ligament repair, L rotator cuff repair, shoulder arthoscopy with biceps tenodesis, exposure to agent orange, L carpal tunnel, L ulnar neuropathy, insomnia, OSA    Examination-Activity Limitations Bend;Dressing;Locomotion Level;Stand    Examination-Participation Restrictions Community Activity;Other   wood working   Stability/Clinical Decision Making Stable/Uncomplicated    Rehab Potential Good    PT Frequency 2x / week    PT Duration 6 weeks    PT Treatment/Interventions ADLs/Self Care Home Management;Canalith Repostioning;Functional mobility training;Therapeutic activities;Therapeutic exercise;Balance training;Neuromuscular re-education;Patient/family education;Vestibular    PT Next Visit Plan Recheck L and R hallpike - treat as indicated - anterior canal 360 roll or reverse liberatory manuever.    Consulted and Agree with Plan of Care Patient             Patient will benefit from skilled therapeutic intervention in order to improve the following deficits and impairments:  Decreased balance, Dizziness  Visit Diagnosis: BPPV (benign paroxysmal positional vertigo), bilateral  Dizziness and giddiness     Problem List Patient Active Problem List   Diagnosis Date Noted   Transient ischemic attack (TIA) 05/01/2021   Vitreomacular adhesion of both eyes 09/17/2020   OSA (obstructive sleep apnea) 08/08/2020   Snores 08/05/2020   Sleep apnea 08/03/2020   Severe nonproliferative diabetic retinopathy of left eye, with macular edema, associated with type 2 diabetes mellitus ([place]) 08/03/2020   Severe nonproliferative diabetic retinopathy of right eye, with macular edema, associated with type 2 diabetes mellitus ([place]) 08/03/2020   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 10/05/2017   SBO (small bowel obstruction) ([place]) 09/28/2017   HTN (hypertension) 09/28/2017   HLD (hyperlipidemia) 09/28/2017   Type 2 diabetes mellitus  with nephropathy ([place]) 09/28/2017   CKD stage 3 secondary to diabetes ([place]) 09/28/2017   AKI (acute kidney injury) ([place]) 09/28/2017   GERD (gastroesophageal reflux disease) 09/28/2017   Vision, loss, sudden 09/23/2014   Amaurosis fugax of right eye 09/09/2014   Benign paroxysmal positional vertigo 09/09/2014   Headache 09/09/2014   Vertigo 09/09/2014   Rico Junker, PT, DPT 08/19/21    10:02 AM    [facility] [address] [place], [place], [address] Phone: [phone]   Fax:  [phone]  Name: Alex Mccarty MRN: [id number] Date of Birth: 01-26-1946

## 2021-08-27 ENCOUNTER — Encounter: Payer: HMO | Admitting: Physical Therapy

## 2021-08-31 ENCOUNTER — Other Ambulatory Visit: Payer: Self-pay

## 2021-08-31 ENCOUNTER — Ambulatory Visit (INDEPENDENT_AMBULATORY_CARE_PROVIDER_SITE_OTHER): Payer: HMO | Admitting: Ophthalmology

## 2021-08-31 DIAGNOSIS — E113411 Type 2 diabetes mellitus with severe nonproliferative diabetic retinopathy with macular edema, right eye: Secondary | ICD-10-CM | POA: Diagnosis not present

## 2021-08-31 DIAGNOSIS — H43823 Vitreomacular adhesion, bilateral: Secondary | ICD-10-CM | POA: Diagnosis not present

## 2021-08-31 DIAGNOSIS — E113412 Type 2 diabetes mellitus with severe nonproliferative diabetic retinopathy with macular edema, left eye: Secondary | ICD-10-CM | POA: Diagnosis not present

## 2021-08-31 NOTE — Assessment & Plan Note (Signed)
CSME also improved OD, will continue to monitor and observe

## 2021-08-31 NOTE — Assessment & Plan Note (Signed)
Physiologic area

## 2021-08-31 NOTE — Assessment & Plan Note (Signed)
Very mild seen only on high-definition OCT not amenable to therapy with good acuity OS we will continue to monitor and observe

## 2021-08-31 NOTE — Progress Notes (Signed)
08/31/2021     CHIEF COMPLAINT Patient presents for  Chief Complaint  Patient presents with   Diabetic Retinopathy with Macular Edema      HISTORY OF PRESENT ILLNESS: Alex Mccarty is a 75 y.o. male who presents to the clinic today for:   HPI   History of bilateral diabetic retinopathy with macular edema most recent treatment was in the right eye 11 weeks previously in the left eye was 7 months previous  No interval change in medical history other than epidural injection of steroids for back pain, and new scalp lesion skin dermatological procedure Last edited by Hurman Horn, MD on 08/31/2021  2:37 PM.      Referring physician: Elisabeth Cara, PA-C [address] [place],  [place] [address]  HISTORICAL INFORMATION:   Selected notes from the [facility]    Lab Results  Component Value Date   HGBA1C 6.6 (H) 05/01/2021     CURRENT MEDICATIONS: No current outpatient medications on file. (Ophthalmic Drugs)   No current facility-administered medications for this visit. (Ophthalmic Drugs)   Current Outpatient Medications (Other)  Medication Sig   aspirin 81 MG EC tablet Take 1 tablet by mouth daily.   atorvastatin (LIPITOR) 80 MG tablet Take 80 mg by mouth at bedtime.    cetirizine (ZYRTEC) 10 MG tablet Take 10 mg by mouth daily as needed for allergies.   Cholecalciferol (VITAMIN D3) 50 MCG (2000 UT) TABS Take 1 tablet by mouth daily.   cyclobenzaprine (FLEXERIL) 10 MG tablet Take 10 mg by mouth as needed (muscle pain).    gabapentin (NEURONTIN) 300 MG capsule Take 300 mg by mouth at bedtime.   Glucosamine-Chondroitin (COSAMIN DS PO) Take 1 capsule by mouth at bedtime.   LANTUS SOLOSTAR 100 UNIT/ML Solostar Pen Inject 45 Units into the skin every morning.    losartan (COZAAR) 100 MG tablet Take 100 mg by mouth every evening.   omeprazole (PRILOSEC) 40 MG capsule Take 40 mg by mouth every morning.   potassium citrate (UROCIT-K) 5 MEQ  (540 MG) SR tablet Take 3 mEq by mouth every morning.   propranolol (INDERAL) 20 MG tablet Take 80 mg by mouth every morning.   ticagrelor (BRILINTA) 90 MG TABS tablet TAKE ONE TABLET BY MOUTH TWICE A DAY AS DIRECTED * [facility]/21 FOR FURTHER EVALUATION *  REPLACES CLOPIDOGREL AS DIRECTED * [facility] 06/29/21 FOR FURTHER EVALUATION *  REPLACES CLOPIDOGREL   tiZANidine (ZANAFLEX) 4 MG tablet Take 4 mg by mouth every 6 (six) hours as needed for muscle spasms.   zinc gluconate 50 MG tablet Take 50 mg by mouth daily.   No current facility-administered medications for this visit. (Other)      REVIEW OF SYSTEMS: ROS   Negative for: Constitutional, Gastrointestinal, Neurological, Skin, Genitourinary, Musculoskeletal, HENT, Endocrine, Cardiovascular, Eyes, Respiratory, Psychiatric, Allergic/Imm, Heme/Lymph Last edited by Hurman Horn, MD on 08/31/2021  2:37 PM.       ALLERGIES Allergies  Allergen Reactions   Amlodipine Besylate Other (See Comments)    HIVES   Codeine Nausea And Vomiting   Penicillins Other (See Comments)    Childhood reaction Has patient had a PCN reaction causing immediate rash, facial/tongue/throat swelling, SOB or lightheadedness with hypotension: Unknown Has patient had a PCN reaction causing severe rash involving mucus membranes or skin necrosis: Unknown Has patient had a PCN reaction that required hospitalization: Unknown Has patient had a PCN  reaction occurring within the last 10 years: Unknown If all of the above answers are "NO", then may proceed with Cephalosporin use.     Sulfa Antibiotics Nausea And Vomiting    PAST MEDICAL HISTORY Past Medical History:  Diagnosis Date   Arthritis    Bowel obstruction (HCC)    CKD (chronic kidney disease)    unkknown stage per patient   Diabetes mellitus ([place])    Hyperlipidemia    Hypertension    Kidney stones    Past Surgical History:  Procedure Laterality Date    COLONOSCOPY  10/2015   Dr Kenton Kingfisher   COLOSTOMY REVISION Right 09/29/2017   Procedure: COLON RESECTION RIGHT;  Surgeon: Jovita Kussmaul, MD;  Location: [facility];  Service: General;  Laterality: Right;   ELBOW SURGERY     ulnar nerve repair   ESOPHAGOGASTRODUODENOSCOPY     same time    FOOT SURGERY     crushed heel   LAPAROTOMY N/A 09/29/2017   Procedure: EXPLORATORY LAPAROTOMY;  Surgeon: Jovita Kussmaul, MD;  Location: [facility];  Service: General;  Laterality: N/A;    FAMILY HISTORY Family History  Problem Relation Age of Onset   Kidney Stones Mother    Heart disease Mother    Colon cancer Neg Hx    Esophageal cancer Neg Hx     SOCIAL HISTORY Social History   Tobacco Use   Smoking status: Former   Smokeless tobacco: Never   Tobacco comments:    quit over 40 years ago.Around age [age]   Vaping Use   Vaping Use: Never used  Substance Use Topics   Alcohol use: Yes    Alcohol/week: 0.0 standard drinks    Comment: occasional   Drug use: No         OPHTHALMIC EXAM:  Base Eye Exam     Visual Acuity (ETDRS)       Right Left   Dist cc 20/25 20/20         Tonometry (Tonopen, 2:36 PM)       Right Left   Pressure 20 20         Pupils       Pupils APD   Right PERRL None   Left PERRL None         Visual Fields       Left Right    Full Full         Extraocular Movement       Right Left    Full, Ortho Full, Ortho         Neuro/Psych     Oriented x3: Yes   Mood/Affect: Normal         Dilation     Both eyes: 1.0% Mydriacyl, 2.5% Phenylephrine @ 2:36 PM           Slit Lamp and Fundus Exam     External Exam       Right Left   External Normal Normal         Slit Lamp Exam       Right Left   Lids/Lashes Normal Normal   Conjunctiva/Sclera White and quiet White and quiet   Cornea Clear Clear   Anterior Chamber Deep and quiet Deep and quiet   Iris Round and reactive Round and reactive   Lens Centered posterior chamber  intraocular lens Centered posterior chamber intraocular lens   Anterior Vitreous Normal Normal         Fundus Exam  Right Left   Posterior Vitreous Normal,  Normal   Disc Normal Normal   C/D Ratio 0.3 0.2   Macula Microaneurysms temporally , Mild clinically significant macular edema Microaneurysms, Mild clinically significant macular edema. temporally   Vessels NPDR-Severe NPDR-Severe   Periphery Normal Normal            IMAGING AND PROCEDURES  Imaging and Procedures for 08/31/21  OCT, Retina - OU - Both Eyes       Right Eye Quality was good. Scan locations included subfoveal. Central Foveal Thickness: 282. Progression has improved. Findings include vitreomacular adhesion .   Left Eye Quality was good. Scan locations included subfoveal. Central Foveal Thickness: 282. Progression has worsened. Findings include cystoid macular edema, vitreomacular adhesion .   Notes  OD, recurrent CSME temporal to the fovea, currently at 3 months postinjection.  OS, much improved CSME, small area inferotemporal will observe                ASSESSMENT/PLAN:  Severe nonproliferative diabetic retinopathy of left eye, with macular edema, associated with type 2 diabetes mellitus ([place]) Very mild seen only on high-definition OCT not amenable to therapy with good acuity OS we will continue to monitor and observe  Severe nonproliferative diabetic retinopathy of right eye, with macular edema, associated with type 2 diabetes mellitus (HCC) CSME also improved OD, will continue to monitor and observe  Vitreomacular adhesion of both eyes Physiologic area      ICD-10-CM   1. Severe nonproliferative diabetic retinopathy of left eye, with macular edema, associated with type 2 diabetes mellitus (HCC)  X32.4401 OCT, Retina - OU - Both Eyes    2. Severe nonproliferative diabetic retinopathy of right eye, with macular edema, associated with type 2 diabetes mellitus (HCC)  E11.3411 OCT,  Retina - OU - Both Eyes    3. Vitreomacular adhesion of both eyes  H43.823       1.  OU with now severe NPDR however there is no active maculopathy affecting quality of vision.  We will continue to observe  2.  3.  Ophthalmic Meds Ordered this visit:  No orders of the defined types were placed in this encounter.      Return in about 5 months (around 01/29/2022) for DILATE OU, COLOR FP, OCT.  There are no Patient Instructions on file for this visit.   Explained the diagnoses, plan, and follow up with the patient and they expressed understanding.  Patient expressed understanding of the importance of proper follow up care.   Clent Demark Alysandra Lobue M.D. Diseases & Surgery of the Retina and Vitreous [facility] 08/31/21     Abbreviations: M myopia (nearsighted); A astigmatism; H hyperopia (farsighted); P presbyopia; Mrx spectacle prescription;  CTL contact lenses; OD right eye; OS left eye; OU both eyes  XT exotropia; ET esotropia; PEK punctate epithelial keratitis; PEE punctate epithelial erosions; DES dry eye syndrome; MGD meibomian gland dysfunction; ATs artificial tears; PFAT's preservative free artificial tears; [place] nuclear sclerotic cataract; PSC posterior subcapsular cataract; ERM epi-retinal membrane; PVD posterior vitreous detachment; RD retinal detachment; DM diabetes mellitus; DR diabetic retinopathy; NPDR non-proliferative diabetic retinopathy; PDR proliferative diabetic retinopathy; CSME clinically significant macular edema; DME diabetic macular edema; dbh dot blot hemorrhages; CWS cotton wool spot; POAG primary open angle glaucoma; C/D cup-to-disc ratio; HVF humphrey visual field; GVF goldmann visual field; OCT optical coherence tomography; IOP intraocular pressure; BRVO Branch retinal vein occlusion; CRVO central retinal vein occlusion; CRAO central retinal artery occlusion; BRAO  branch retinal artery occlusion; RT retinal tear; SB scleral buckle; PPV pars plana  vitrectomy; VH Vitreous hemorrhage; PRP panretinal laser photocoagulation; IVK intravitreal kenalog; VMT vitreomacular traction; MH Macular hole;  NVD neovascularization of the disc; NVE neovascularization elsewhere; AREDS age related eye disease study; ARMD age related macular degeneration; POAG primary open angle glaucoma; EBMD epithelial/anterior basement membrane dystrophy; ACIOL anterior chamber intraocular lens; IOL intraocular lens; PCIOL posterior chamber intraocular lens; Phaco/IOL phacoemulsification with intraocular lens placement; [place] photorefractive keratectomy; LASIK laser assisted in situ keratomileusis; HTN hypertension; DM diabetes mellitus; COPD chronic obstructive pulmonary disease

## 2021-09-02 ENCOUNTER — Ambulatory Visit: Payer: No Typology Code available for payment source | Admitting: Physical Therapy

## 2021-09-03 ENCOUNTER — Emergency Department (HOSPITAL_COMMUNITY): Payer: No Typology Code available for payment source

## 2021-09-03 ENCOUNTER — Emergency Department (HOSPITAL_COMMUNITY)
Admission: EM | Admit: 2021-09-03 | Discharge: 2021-09-03 | Disposition: A | Discharge status: Home / Self Care | Payer: No Typology Code available for payment source | Attending: Student | Admitting: Student

## 2021-09-03 ENCOUNTER — Other Ambulatory Visit: Payer: Self-pay

## 2021-09-03 ENCOUNTER — Encounter (HOSPITAL_COMMUNITY): Payer: Self-pay

## 2021-09-03 DIAGNOSIS — Z87891 Personal history of nicotine dependence: Secondary | ICD-10-CM | POA: Diagnosis not present

## 2021-09-03 DIAGNOSIS — E1122 Type 2 diabetes mellitus with diabetic chronic kidney disease: Secondary | ICD-10-CM | POA: Insufficient documentation

## 2021-09-03 DIAGNOSIS — I129 Hypertensive chronic kidney disease with stage 1 through stage 4 chronic kidney disease, or unspecified chronic kidney disease: Secondary | ICD-10-CM | POA: Insufficient documentation

## 2021-09-03 DIAGNOSIS — R531 Weakness: Secondary | ICD-10-CM | POA: Diagnosis not present

## 2021-09-03 DIAGNOSIS — N183 Chronic kidney disease, stage 3 unspecified: Secondary | ICD-10-CM | POA: Insufficient documentation

## 2021-09-03 DIAGNOSIS — E113413 Type 2 diabetes mellitus with severe nonproliferative diabetic retinopathy with macular edema, bilateral: Secondary | ICD-10-CM | POA: Diagnosis not present

## 2021-09-03 DIAGNOSIS — R509 Fever, unspecified: Secondary | ICD-10-CM | POA: Diagnosis not present

## 2021-09-03 DIAGNOSIS — Z7982 Long term (current) use of aspirin: Secondary | ICD-10-CM | POA: Diagnosis not present

## 2021-09-03 DIAGNOSIS — Z20822 Contact with and (suspected) exposure to covid-19: Secondary | ICD-10-CM | POA: Diagnosis not present

## 2021-09-03 DIAGNOSIS — J101 Influenza due to other identified influenza virus with other respiratory manifestations: Secondary | ICD-10-CM | POA: Diagnosis not present

## 2021-09-03 DIAGNOSIS — R059 Cough, unspecified: Secondary | ICD-10-CM | POA: Diagnosis not present

## 2021-09-03 DIAGNOSIS — J111 Influenza due to unidentified influenza virus with other respiratory manifestations: Secondary | ICD-10-CM

## 2021-09-03 DIAGNOSIS — R112 Nausea with vomiting, unspecified: Secondary | ICD-10-CM

## 2021-09-03 LAB — COMPREHENSIVE METABOLIC PANEL
ALT: 35 U/L (ref 0–44)
AST: 24 U/L (ref 15–41)
Albumin: 3.1 g/dL — ABNORMAL LOW (ref 3.5–5.0)
Alkaline Phosphatase: 53 U/L (ref 38–126)
Anion gap: 8 (ref 5–15)
BUN: 28 mg/dL — ABNORMAL HIGH (ref 8–23)
CO2: 22 mmol/L (ref 22–32)
Calcium: 9.3 mg/dL (ref 8.9–10.3)
Chloride: 108 mmol/L (ref 98–111)
Creatinine, Ser: 2.57 mg/dL — ABNORMAL HIGH (ref 0.61–1.24)
GFR, Estimated: 26 mL/min — ABNORMAL LOW (ref >60)
Glucose, Bld: 117 mg/dL — ABNORMAL HIGH (ref 70–99)
Potassium: 4.3 mmol/L (ref 3.5–5.1)
Sodium: 138 mmol/L (ref 135–145)
Total Bilirubin: 1 mg/dL (ref 0.3–1.2)
Total Protein: 5.9 g/dL — ABNORMAL LOW (ref 6.5–8.1)

## 2021-09-03 LAB — CBC WITH DIFFERENTIAL/PLATELET
Abs Immature Granulocytes: 0.01 10*3/uL (ref 0.00–0.07)
Basophils Absolute: 0 10*3/uL (ref 0.0–0.1)
Basophils Relative: 0 %
Eosinophils Absolute: 0 10*3/uL (ref 0.0–0.5)
Eosinophils Relative: 1 %
HCT: 42.9 % (ref 39.0–52.0)
Hemoglobin: 13.6 g/dL (ref 13.0–17.0)
Immature Granulocytes: 0 %
Lymphocytes Relative: 10 %
Lymphs Abs: 0.5 10*3/uL — ABNORMAL LOW (ref 0.7–4.0)
MCH: 30.6 pg (ref 26.0–34.0)
MCHC: 31.7 g/dL (ref 30.0–36.0)
MCV: 96.4 fL (ref 80.0–100.0)
Monocytes Absolute: 0.8 10*3/uL (ref 0.1–1.0)
Monocytes Relative: 14 %
Neutro Abs: 4.2 10*3/uL (ref 1.7–7.7)
Neutrophils Relative %: 75 %
Platelets: 122 10*3/uL — ABNORMAL LOW (ref 150–400)
RBC: 4.45 MIL/uL (ref 4.22–5.81)
RDW: 14.7 % (ref 11.5–15.5)
WBC: 5.5 10*3/uL (ref 4.0–10.5)
nRBC: 0 % (ref 0.0–0.2)

## 2021-09-03 LAB — URINALYSIS, ROUTINE W REFLEX MICROSCOPIC
Bilirubin Urine: NEGATIVE
Glucose, UA: NEGATIVE mg/dL
Ketones, ur: NEGATIVE mg/dL
Leukocytes,Ua: NEGATIVE
Nitrite: NEGATIVE
Protein, ur: 100 mg/dL — AB
Specific Gravity, Urine: 1.025 (ref 1.005–1.030)
pH: 5.5 (ref 5.0–8.0)

## 2021-09-03 LAB — URINALYSIS, MICROSCOPIC (REFLEX): Bacteria, UA: NONE SEEN

## 2021-09-03 LAB — RESP PANEL BY RT-PCR (FLU A&B, COVID) ARPGX2
Influenza A by PCR: POSITIVE — AB
Influenza B by PCR: NEGATIVE
SARS Coronavirus 2 by RT PCR: NEGATIVE

## 2021-09-03 MED ORDER — SODIUM CHLORIDE 0.9 % IV BOLUS
1000.0000 mL | Freq: Once | INTRAVENOUS | Status: AC
Start: 2021-09-03 — End: 2021-09-03
  Administered 2021-09-03: 1000 mL via INTRAVENOUS

## 2021-09-03 MED ORDER — ONDANSETRON 4 MG PO TBDP: 4.0000 mg | ORAL_TABLET | Freq: Three times a day (TID) | ORAL | 0 refills | Status: DC | PRN

## 2021-09-03 MED ORDER — ONDANSETRON HCL 4 MG/2ML IJ SOLN
4.0000 mg | Freq: Once | INTRAMUSCULAR | Status: AC
Start: 2021-09-03 — End: 2021-09-03
  Administered 2021-09-03: 4 mg via INTRAVENOUS
  Filled 2021-09-03: qty 2

## 2021-09-03 MED ORDER — ACETAMINOPHEN 325 MG PO TABS
650.0000 mg | ORAL_TABLET | Freq: Once | ORAL | Status: AC
  Administered 2021-09-03: 650 mg via ORAL
  Filled 2021-09-03: qty 2

## 2021-09-03 NOTE — ED Notes (Signed)
Pt ambulated in the room. Stated that he feels that he has more strength and feels more comfortable walking than he did PTA.

## 2021-09-03 NOTE — ED Provider Notes (Signed)
[facility] Provider Note   CSN: [id number] Arrival date & time: 09/03/21  8850     History Chief Complaint  Patient presents with   Nausea    Alex Mccarty is a 75 y.o. male with Pmhx HTN, HLD, Diabetes, CKD who presents to the ED today with complaint of gradual onset, constant, generalized weakness that he noticed this AM while trying to get out of bed.  Patient reports that he began having nausea, nonbloody nonbilious emesis, diarrhea last night around 5 PM.  He does report that his wife has been sick for the past couple of days with flulike symptoms and tested positive for the flu yesterday.  He also was tested however was negative.  Patient reports after being tested he went home and then began having symptoms.  He states that he took a Phenergan last night around 10 PM which seemed to help with the vomiting however continued to have diarrhea throughout the night.  He states that when he tried to get up this morning to use the bathroom he was unable to due to generalized weakness and his wife called EMS as he seemed to be stumbling.  Patient denies any facial droop, unilateral weakness or numbness, speech changes, abdominal pain, fever.  He is found to be febrile here at 100.3.   The history is provided by the patient, the EMS personnel and medical records.      Past Medical History:  Diagnosis Date   Arthritis    Bowel obstruction ([place])    CKD (chronic kidney disease)    unkknown stage per patient   Diabetes mellitus ([place])    Hyperlipidemia    Hypertension    Kidney stones     Patient Active Problem List   Diagnosis Date Noted   Transient ischemic attack (TIA) 05/01/2021   Vitreomacular adhesion of both eyes 09/17/2020   OSA (obstructive sleep apnea) 08/08/2020   Snores 08/05/2020   Sleep apnea 08/03/2020   Severe nonproliferative diabetic retinopathy of left eye, with macular edema, associated with type 2 diabetes mellitus ([place])  08/03/2020   Severe nonproliferative diabetic retinopathy of right eye, with macular edema, associated with type 2 diabetes mellitus ([place]) 08/03/2020   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 10/05/2017   SBO (small bowel obstruction) ([place]) 09/28/2017   HTN (hypertension) 09/28/2017   HLD (hyperlipidemia) 09/28/2017   Type 2 diabetes mellitus with nephropathy ([place]) 09/28/2017   CKD stage 3 secondary to diabetes ([place]) 09/28/2017   AKI (acute kidney injury) ([place]) 09/28/2017   GERD (gastroesophageal reflux disease) 09/28/2017   Vision, loss, sudden 09/23/2014   Amaurosis fugax of right eye 09/09/2014   Benign paroxysmal positional vertigo 09/09/2014   Headache 09/09/2014   Vertigo 09/09/2014    Past Surgical History:  Procedure Laterality Date   COLONOSCOPY  10/2015   Dr Kenton Kingfisher   COLOSTOMY REVISION Right 09/29/2017   Procedure: COLON RESECTION RIGHT;  Surgeon: Jovita Kussmaul, MD;  Location: [facility];  Service: General;  Laterality: Right;   ELBOW SURGERY     ulnar nerve repair   ESOPHAGOGASTRODUODENOSCOPY     same time    FOOT SURGERY     crushed heel   LAPAROTOMY N/A 09/29/2017   Procedure: EXPLORATORY LAPAROTOMY;  Surgeon: Jovita Kussmaul, MD;  Location[facility];  Service: General;  Laterality: N/A;       Family History  Problem Relation Age of Onset   Kidney Stones Mother    Heart disease  Mother    Colon cancer Neg Hx    Esophageal cancer Neg Hx     Social History   Tobacco Use   Smoking status: Former   Smokeless tobacco: Never   Tobacco comments:    quit over 40 years ago.Around age [age]   Vaping Use   Vaping Use: Never used  Substance Use Topics   Alcohol use: Yes    Alcohol/week: 0.0 standard drinks    Comment: occasional   Drug use: No    Home Medications Prior to Admission medications   Medication Sig Start Date End Date Taking? Authorizing Provider  ondansetron (ZOFRAN-ODT) 4 MG disintegrating tablet Take 1 tablet (4 mg total) by mouth every 8  (eight) hours as needed for nausea or vomiting. 09/03/21  Yes Alroy Bailiff, Wannetta Langland, PA-C  aspirin 81 MG EC tablet Take 1 tablet by mouth daily. 04/30/19   [provider]  atorvastatin (LIPITOR) 80 MG tablet Take 80 mg by mouth at bedtime.  02/23/14   [provider]  cetirizine (ZYRTEC) 10 MG tablet Take 10 mg by mouth daily as needed for allergies.    [provider]  Cholecalciferol (VITAMIN D3) 50 MCG (2000 UT) TABS Take 1 tablet by mouth daily.    [provider]  cyclobenzaprine (FLEXERIL) 10 MG tablet Take 10 mg by mouth as needed (muscle pain).     [provider]  gabapentin (NEURONTIN) 300 MG capsule Take 300 mg by mouth at bedtime.    [provider]  Glucosamine-Chondroitin (COSAMIN DS PO) Take 1 capsule by mouth at bedtime.    [provider]  LANTUS SOLOSTAR 100 UNIT/ML Solostar Pen Inject 45 Units into the skin every morning.  06/02/14   [provider]  losartan (COZAAR) 100 MG tablet Take 100 mg by mouth every evening. 08/08/14   [provider]  omeprazole (PRILOSEC) 40 MG capsule Take 40 mg by mouth every morning.    [provider]  potassium citrate (UROCIT-K) 5 MEQ (540 MG) SR tablet Take 3 mEq by mouth every morning.    [provider]  propranolol (INDERAL) 20 MG tablet Take 80 mg by mouth every morning.    [provider]  ticagrelor (BRILINTA) 90 MG TABS tablet TAKE ONE TABLET BY MOUTH TWICE A DAY AS DIRECTED * [facility] FOR FURTHER EVALUATION *  REPLACES CLOPIDOGREL AS DIRECTED * [facility] 06/29/21 FOR FURTHER EVALUATION *  REPLACES CLOPIDOGREL 06/07/21   [provider]  tiZANidine (ZANAFLEX) 4 MG tablet Take 4 mg by mouth every 6 (six) hours as needed for muscle spasms.    [provider]  zinc gluconate 50 MG tablet Take 50 mg by mouth daily.    [provider]    Allergies    Amlodipine besylate,  Codeine, Penicillins, and Sulfa antibiotics  Review of Systems   Review of Systems  Constitutional:  Positive for fatigue and fever.  Eyes:  Negative for visual disturbance.  Respiratory:  Positive for cough. Negative for shortness of breath.   Gastrointestinal:  Positive for diarrhea, nausea and vomiting. Negative for abdominal pain.  Neurological:  Positive for weakness (generalized). Negative for dizziness, facial asymmetry, speech difficulty and headaches.  All other systems reviewed and are negative.  Physical Exam Updated Vital Signs Pulse 94    Temp 100.3 F (37.9 C) (Oral)    Resp 20    Ht 5\' 9"  (1.753 m)    Wt 83.9 kg  SpO2 96%    BMI 27.32 kg/m   Physical Exam Vitals and nursing note reviewed.  Constitutional:      Appearance: He is not ill-appearing or diaphoretic.  HENT:     Head: Normocephalic and atraumatic.     Mouth/Throat:     Mouth: Mucous membranes are dry.  Eyes:     Conjunctiva/sclera: Conjunctivae normal.  Cardiovascular:     Rate and Rhythm: Normal rate and regular rhythm.  Pulmonary:     Effort: Pulmonary effort is normal.     Breath sounds: Normal breath sounds. No wheezing, rhonchi or rales.     Comments: Actively coughing in room however speaking in full sentences between coughing spells. Satting 96% on RA. LCTAB.  Abdominal:     Palpations: Abdomen is soft.     Tenderness: There is no abdominal tenderness. There is no guarding or rebound.  Musculoskeletal:     Cervical back: Neck supple.  Skin:    General: Skin is warm and dry.  Neurological:     General: No focal deficit present.     Mental Status: He is alert and oriented to person, place, and time.     Motor: No weakness.    ED Results / Procedures / Treatments   Labs (all labs ordered are listed, but only abnormal results are displayed) Labs Reviewed  RESP PANEL BY RT-PCR (FLU A&B, COVID) ARPGX2 - Abnormal; Notable for the following components:      Result Value   Influenza A by  PCR POSITIVE (*)    All other components within normal limits  COMPREHENSIVE METABOLIC PANEL - Abnormal; Notable for the following components:   Glucose, Bld 117 (*)    BUN 28 (*)    Creatinine, Ser 2.57 (*)    Total Protein 5.9 (*)    Albumin 3.1 (*)    GFR, Estimated 26 (*)    All other components within normal limits  CBC WITH DIFFERENTIAL/PLATELET - Abnormal; Notable for the following components:   Platelets 122 (*)    Lymphs Abs 0.5 (*)    All other components within normal limits  URINALYSIS, ROUTINE W REFLEX MICROSCOPIC - Abnormal; Notable for the following components:   Hgb urine dipstick TRACE (*)    Protein, ur 100 (*)    All other components within normal limits  URINALYSIS, MICROSCOPIC (REFLEX)    EKG None  Radiology DG Chest Port 1 View  Result Date: 09/03/2021 CLINICAL DATA:  Cough, fever, congestion, and weakness. EXAM: PORTABLE CHEST 1 VIEW COMPARISON:  05/01/2021 FINDINGS: The cardiomediastinal silhouette is unchanged with normal heart size. Right greater than left apical scarring is noted. No acute airspace consolidation, edema, sizable pleural effusion, pneumothorax is identified. No acute osseous abnormality is seen. IMPRESSION: No active disease. Electronically Signed   By: Logan Bores M.D.   On: 09/03/2021 08:48    Procedures Procedures   Medications Ordered in ED Medications  sodium chloride 0.9 % bolus 1,000 mL (0 mLs Intravenous Stopped 09/03/21 1049)  ondansetron (ZOFRAN) injection 4 mg (4 mg Intravenous Given 09/03/21 0829)  acetaminophen (TYLENOL) tablet 650 mg (650 mg Oral Given 09/03/21 1245)    ED Course  I have reviewed the triage vital signs and the nursing notes.  Pertinent labs & imaging results that were available during my care of the patient were reviewed by me and considered in my medical decision making (see chart for details).  Clinical Course as of 09/03/21 1140  Fri Sep 03, 2021  8099 Influenza  A By PCR(!): POSITIVE [MV]     Clinical Course User Index [MV] Eustaquio Maize, PA-C   MDM Rules/Calculators/A&P                          75 year old male who presents to the ED today with complaint of nausea, vomiting, diarrhea, generalized weakness.  Wife at home flu positive.  On arrival to the ED patient is febrile 100.3.  Remainder vitals unremarkable markable.  Patient is resting comfortably in bed.  He is noted to have dry mucous membranes.  His abdomen is soft and nontender.  His lungs are clear to auscultation bilaterally despite actively coughing in the room.  He is satting 96% on room air.  We will plan for labs including CBC, CMP, urinalysis as well as COVID/flu testing.  We will plan for chest x-ray due to cough.  Will provide fluids and antiemetics and reevaluate patient.  Suspect symptoms are likely related to fluid this time given positive recent sick contact with wife and fever here in the ED.  His abdomen is soft and nontender.  I have very low suspicion for acute abdomen at this time.  Does not currently meet SIRS or sepsis criteria.  If work-up reassuring and patient able to tolerate fluids will plan to discharge home.   CXR clear CBC without leukocytosis. Hgb stable at 13.6 CMP with creatinine 2.57; slightly elevated from baseline however not consistent with AKI. No other electrolyte abnormalities.   Lab Results  Component Value Date   CREATININE 2.57 (H) 09/03/2021   CREATININE 2.22 (H) 05/03/2021   CREATININE 2.38 (H) 05/02/2021   Flu A positive.   On reevaluation pt resting comfortably. Has been able to tolerate PO without difficulty. Will plan to ambulate given complaint of generalized weakness and if able to ambulate successfully without assistance will discharge home. Pt reports he was prescribed Tamiflu yesterday however had no started it yet due to vomiting. Will discharge with Zofran. EKG without prolonged qTC. Pt in agreement with plan.   Pt able to ambulate successfully without difficulty.  Stable for discharge home at this time.   This note was prepared using Dragon voice recognition software and may include unintentional dictation errors due to the inherent limitations of voice recognition software.     Final Clinical Impression(s) / ED Diagnoses Final diagnoses:  Influenza  Nausea vomiting and diarrhea    Rx / DC Orders ED Discharge Orders          Ordered    ondansetron (ZOFRAN-ODT) 4 MG disintegrating tablet  Every 8 hours PRN        09/03/21 1121             Discharge Instructions      Please pick up nausea medication and take as prescribed. Continue taking the Tamiflu prescribed to you yesterday for your flu.   Take Tylenol as needed for fevers > 100.4.  Drink plenty of fluids to stay hydrated and rest as much as possible.   Follow up with your PCP for further evaluation  Return to the ED IMMEDIATELY for any new/worsening symptoms including shortness of breath or persistent vomiting despite antiemetics        Eustaquio Maize, PA-C 09/03/21 [place], MD 09/03/21 1610

## 2021-09-03 NOTE — Discharge Instructions (Addendum)
Please pick up nausea medication and take as prescribed. Continue taking the Tamiflu prescribed to you yesterday for your flu.   Take Tylenol as needed for fevers > 100.4.  Drink plenty of fluids to stay hydrated and rest as much as possible.   Follow up with your PCP for further evaluation  Return to the ED IMMEDIATELY for any new/worsening symptoms including shortness of breath or persistent vomiting despite antiemetics

## 2021-09-03 NOTE — ED Triage Notes (Signed)
[facility] reports pt coming from home, Wife states pt got up to go to bathroom and was stumbling which is not normal for him. Wife tested positive for flu yesterday. Pt c/o nausea.

## 2021-09-03 NOTE — ED Notes (Signed)
Help get patient undressed on the monitor patient is resting with call bell in reach

## 2021-09-03 NOTE — ED Notes (Signed)
Pt give water for PO challenge.

## 2021-09-07 ENCOUNTER — Ambulatory Visit: Payer: No Typology Code available for payment source | Admitting: Physical Therapy

## 2021-09-10 ENCOUNTER — Ambulatory Visit: Payer: No Typology Code available for payment source | Admitting: Physical Therapy

## 2021-09-23 ENCOUNTER — Encounter: Payer: HMO | Admitting: Physical Therapy

## 2021-09-28 ENCOUNTER — Encounter: Payer: HMO | Admitting: Physical Therapy

## 2021-09-30 ENCOUNTER — Encounter: Payer: HMO | Admitting: Physical Therapy

## 2021-10-04 ENCOUNTER — Encounter: Payer: HMO | Admitting: Physical Therapy

## 2021-10-07 ENCOUNTER — Encounter: Payer: HMO | Admitting: Physical Therapy

## 2021-10-11 ENCOUNTER — Encounter: Payer: HMO | Admitting: Physical Therapy

## 2021-10-14 ENCOUNTER — Encounter: Payer: HMO | Admitting: Physical Therapy

## 2022-01-17 NOTE — H&P (View-Only) (Signed)
?Cardiology Office Note:   ? ?Date:  01/18/2022  ? ?ID:  Alex Mccarty, DOB 09-Sep-1946, MRN [id number] ? ?PCP:  [facility] ?  ?[facility] Providers ?Cardiologist:  None    ? ?Referring MD: [facility]  ? ?History of Present Illness:   ? ?Alex Mccarty is a 76 y.o. male here for the evaluation of chest pain at the request of Dr. Collins Scotland at the [facility]. ? ?Today: ?He is accompanied by a family member, who also provides the history. Today he states he is feeling terrible. In the past month, he is suffering shortness of breath with minimal activity. This is also associated with a "funny feeling" in his central chest. During the most recent episode he actually felt pain in his chest, which was new. ? ?Last year he had an Echo and treadmill stress test. However, he was physically unable to complete the treadmill test. Then a nuclear stress test was performed, suggestive of coronary blockages. A heart cath was recommended. Currently he is wearing a heart monitor, scheduled to be removed on Thursday. A repeat Echo has also been scheduled for 01/25/22 at the [facility]. ? ?At home his blood pressure has been fluctuating. He states that for years his blood pressure was averaging 629 systolic. However, in the past 6 months it has been increasing up to 528-413 systolic. They anticipate he will be restarted on amlodipine when he sees his nephrologist tomorrow. ? ?Previously, he has had 3 TIAs. His neurologist had started Plavix initially. They note his most recent TIA was very different compared to the others. The Plavix was thought to be ineffective, so it was switched to Brilinta but he developed an adverse reaction. At this time he is back on Plavix. ? ?He endorses kidney disease. Most recent creatinine was reportedly 2.24-2.5. ? ?He is a former smoker. He quit about 40 years ago, when he was in his 31's. ? ?He denies any palpitations, or peripheral edema. No lightheadedness,  headaches, syncope, orthopnea, or PND. ? ?Previously underwent an emergency bowel resection. ? ?[facility] ? ? ? ? ?Past Medical History:  ?Diagnosis Date  ? Arthritis   ? Bowel obstruction ([place])   ? CKD (chronic kidney disease)   ? unkknown stage per patient  ? Diabetes mellitus ([place])   ? Hyperlipidemia   ? Hypertension   ? Kidney stones   ? ? ?Past Surgical History:  ?Procedure Laterality Date  ? COLONOSCOPY  10/2015  ? Dr Kenton Kingfisher  ? COLOSTOMY REVISION Right 09/29/2017  ? Procedure: COLON RESECTION RIGHT;  Surgeon: Jovita Kussmaul, MD;  Location: [facility];  Service: General;  Laterality: Right;  ? ELBOW SURGERY    ? ulnar nerve repair  ? ESOPHAGOGASTRODUODENOSCOPY    ? same time   ? FOOT SURGERY    ? crushed heel  ? LAPAROTOMY N/A 09/29/2017  ? Procedure: EXPLORATORY LAPAROTOMY;  Surgeon: Jovita Kussmaul, MD;  Location: [facility];  Service: General;  Laterality: N/A;  ? ? ?Current Medications: ?Current Meds  ?Medication Sig  ? amLODipine (NORVASC) 2.5 MG tablet Take 1 tablet by mouth daily.  ? aspirin 81 MG EC tablet Take 1 tablet by mouth daily.  ? atorvastatin (LIPITOR) 80 MG tablet Take 80 mg by mouth at bedtime.   ? Cholecalciferol (VITAMIN D3) 50 MCG (2000 UT) TABS Take 1 tablet by mouth daily.  ? clopidogrel (PLAVIX) 75 MG tablet Take 75 mg by mouth daily.  ? cyclobenzaprine (  FLEXERIL) 10 MG tablet Take 10 mg by mouth as needed (muscle pain).   ? empagliflozin (JARDIANCE) 25 MG TABS tablet Take 12.5 mg by mouth daily.  ? gabapentin (NEURONTIN) 300 MG capsule Take 300 mg by mouth at bedtime.  ? Glucosamine-Chondroitin (COSAMIN DS PO) Take 1 capsule by mouth at bedtime.  ? LANTUS SOLOSTAR 100 UNIT/ML Solostar Pen Inject 45 Units into the skin every morning.   ? losartan (COZAAR) 100 MG tablet Take 100 mg by mouth every evening.  ? omeprazole (PRILOSEC) 40 MG capsule Take 40 mg by mouth every morning.  ? primidone (MYSOLINE) 50 MG tablet Take 12.5 mg by mouth at bedtime.  ? propranolol (INDERAL)  20 MG tablet Take 80 mg by mouth every morning.  ? tiZANidine (ZANAFLEX) 4 MG tablet Take 4 mg by mouth every 6 (six) hours as needed for muscle spasms.  ? zinc gluconate 50 MG tablet Take 50 mg by mouth daily.  ?  ? ?Allergies:   Ticagrelor, Codeine, Hydrocodone, Hydromorphone, Penicillins, Primidone, and Sulfa antibiotics  ? ?Social History  ? ?Socioeconomic History  ? Marital status: Married  ?  Spouse name: [name]   ? Number of children: 1  ? Years of education: 12+  ? Highest education level: Not on file  ?Occupational History  ? Not on file  ?Tobacco Use  ? Smoking status: Former  ? Smokeless tobacco: Never  ? Tobacco comments:  ?  quit over 40 years ago.Around age [age]   ?Vaping Use  ? Vaping Use: Never used  ?Substance and Sexual Activity  ? Alcohol use: Yes  ?  Alcohol/week: 0.0 standard drinks  ?  Comment: occasional  ? Drug use: No  ? Sexual activity: Not on file  ?Other Topics Concern  ? Not on file  ?Social History Narrative  ? Lives in [place].   ? Patient lives at home with wife   ? Patient has 1 child  ? Patient works at [employer]   ? Patient has 12+ years of education   ? ?Social Determinants of Health  ? ?Financial Resource Strain: Not on file  ?Food Insecurity: Not on file  ?Transportation Needs: Not on file  ?Physical Activity: Not on file  ?Stress: Not on file  ?Social Connections: Not on file  ?  ? ?Family History: ?The patient's family history includes Heart disease in his mother; Kidney Stones in his mother. There is no history of Colon cancer or Esophageal cancer. ? ?ROS:   ?Please see the history of present illness.    ?(+) Shortness of breath ?(+) Chest pain ?All other systems reviewed and are negative. ? ?EKGs/Labs/Other Studies Reviewed:   ? ?The following studies were reviewed today: ? ?[facility] abnormal with moderate-sized perfusion defect in the basal to mid inferior inferior septal wall worse at stress consistent with ischemia.  3 times daily ratio  was also abnormal at 1.24 suggestive of possible multivessel coronary artery disease.  Ejection fraction was 66%.  ? ?Echo 05/02/2021: ? 1. Left ventricular ejection fraction, by estimation, is 60 to 65%. The  ?left ventricle has normal function. The left ventricle has no regional  ?wall motion abnormalities. Left ventricular diastolic parameters are  ?consistent with Grade I diastolic dysfunction (impaired relaxation).  ? 2. The tricuspid valve is abnormal. Tricuspid valve regurgitation is  ?mild.  ? 3. The aortic valve is tricuspid. Aortic valve regurgitation is not  ?visualized.  ? 4. There is normal pulmonary artery systolic pressure.  ?  5. The inferior vena cava is normal in size with greater than 50%  ?respiratory variability, suggesting right atrial pressure of 3 mmHg.  ? ?Bilateral Carotid Doppler 05/02/2021: ?Summary:  ?Right Carotid: Velocities in the right ICA are consistent with a 1-39%  ?stenosis.  ? ?Left Carotid: Velocities in the left ICA are consistent with a 1-39%  ?stenosis.  ? ?Vertebrals:  Bilateral vertebral arteries demonstrate antegrade flow.  ?Subclavians: Normal flow hemodynamics were seen in bilateral subclavian arteries.  ? ? ?EKG:  EKG is personally reviewed and interpreted. ?01/18/2022: Sinus rhythm. Rate 67 bpm. Septal infarct pattern, nonspecific T wave versions noted.  J-point elevation precordial leads. ? ?Recent Labs: ?09/03/2021: ALT 35; BUN 28; Creatinine, Ser 2.57; Hemoglobin 13.6; Platelets 122; Potassium 4.3; Sodium 138  ? ?Recent Lipid Panel ?   ?Component Value Date/Time  ? CHOL 97 05/02/2021 0341  ? TRIG 84 05/02/2021 0341  ? HDL 25 (L) 05/02/2021 0341  ? CHOLHDL 3.9 05/02/2021 0341  ? VLDL 17 05/02/2021 0341  ? LDLCALC 55 05/02/2021 0341  ? ? ? ?Risk Assessment/Calculations:   ? ? ?    ? ?Physical Exam:   ? ?VS:  BP 128/68   Pulse 67   Ht '5\' 9"'$  (1.753 m)   Wt 186 lb 12.8 oz (84.7 kg)   SpO2 97%   BMI 27.59 kg/m?    ? ?Wt Readings from Last 3 Encounters:  ?01/18/22 186 lb  12.8 oz (84.7 kg)  ?09/03/21 185 lb (83.9 kg)  ?06/24/21 185 lb 5 oz (84.1 kg)  ?  ? ?GEN: Well nourished, well developed in no acute distress ?HEENT: Normal ?NECK: No JVD; No carotid bruits ?LYMPHATICS: No lymph

## 2022-01-17 NOTE — H&P (View-Only) (Signed)
?Cardiology Office Note:   ? ?Date:  01/18/2022  ? ?ID:  Alex Mccarty, DOB 1945-10-05, MRN [id number] ? ?PCP:  [facility] ?  ?[facility] Providers ?Cardiologist:  None    ? ?Referring MD: [facility]  ? ?History of Present Illness:   ? ?Alex Mccarty is a 76 y.o. male here for the evaluation of chest pain at the request of Dr. Collins Scotland at the [facility]. ? ?Today: ?He is accompanied by a family member, who also provides the history. Today he states he is feeling terrible. In the past month, he is suffering shortness of breath with minimal activity. This is also associated with a "funny feeling" in his central chest. During the most recent episode he actually felt pain in his chest, which was new. ? ?Last year he had an Echo and treadmill stress test. However, he was physically unable to complete the treadmill test. Then a nuclear stress test was performed, suggestive of coronary blockages. A heart cath was recommended. Currently he is wearing a heart monitor, scheduled to be removed on Thursday. A repeat Echo has also been scheduled for 01/25/22 at the [facility]. ? ?At home his blood pressure has been fluctuating. He states that for years his blood pressure was averaging 562 systolic. However, in the past 6 months it has been increasing up to 130-865 systolic. They anticipate he will be restarted on amlodipine when he sees his nephrologist tomorrow. ? ?Previously, he has had 3 TIAs. His neurologist had started Plavix initially. They note his most recent TIA was very different compared to the others. The Plavix was thought to be ineffective, so it was switched to Brilinta but he developed an adverse reaction. At this time he is back on Plavix. ? ?He endorses kidney disease. Most recent creatinine was reportedly 2.24-2.5. ? ?He is a former smoker. He quit about 40 years ago, when he was in his 54's. ? ?He denies any palpitations, or peripheral edema. No lightheadedness,  headaches, syncope, orthopnea, or PND. ? ?Previously underwent an emergency bowel resection. ? ?[facility] ? ? ? ? ?Past Medical History:  ?Diagnosis Date  ? Arthritis   ? Bowel obstruction ([place])   ? CKD (chronic kidney disease)   ? unkknown stage per patient  ? Diabetes mellitus ([place])   ? Hyperlipidemia   ? Hypertension   ? Kidney stones   ? ? ?Past Surgical History:  ?Procedure Laterality Date  ? COLONOSCOPY  10/2015  ? Dr Kenton Kingfisher  ? COLOSTOMY REVISION Right 09/29/2017  ? Procedure: COLON RESECTION RIGHT;  Surgeon: Jovita Kussmaul, MD;  Location: [facility];  Service: General;  Laterality: Right;  ? ELBOW SURGERY    ? ulnar nerve repair  ? ESOPHAGOGASTRODUODENOSCOPY    ? same time   ? FOOT SURGERY    ? crushed heel  ? LAPAROTOMY N/A 09/29/2017  ? Procedure: EXPLORATORY LAPAROTOMY;  Surgeon: Jovita Kussmaul, MD;  Location: [facility];  Service: General;  Laterality: N/A;  ? ? ?Current Medications: ?Current Meds  ?Medication Sig  ? amLODipine (NORVASC) 2.5 MG tablet Take 1 tablet by mouth daily.  ? aspirin 81 MG EC tablet Take 1 tablet by mouth daily.  ? atorvastatin (LIPITOR) 80 MG tablet Take 80 mg by mouth at bedtime.   ? Cholecalciferol (VITAMIN D3) 50 MCG (2000 UT) TABS Take 1 tablet by mouth daily.  ? clopidogrel (PLAVIX) 75 MG tablet Take 75 mg by mouth daily.  ? cyclobenzaprine (  FLEXERIL) 10 MG tablet Take 10 mg by mouth as needed (muscle pain).   ? empagliflozin (JARDIANCE) 25 MG TABS tablet Take 12.5 mg by mouth daily.  ? gabapentin (NEURONTIN) 300 MG capsule Take 300 mg by mouth at bedtime.  ? Glucosamine-Chondroitin (COSAMIN DS PO) Take 1 capsule by mouth at bedtime.  ? LANTUS SOLOSTAR 100 UNIT/ML Solostar Pen Inject 45 Units into the skin every morning.   ? losartan (COZAAR) 100 MG tablet Take 100 mg by mouth every evening.  ? omeprazole (PRILOSEC) 40 MG capsule Take 40 mg by mouth every morning.  ? primidone (MYSOLINE) 50 MG tablet Take 12.5 mg by mouth at bedtime.  ? propranolol (INDERAL)  20 MG tablet Take 80 mg by mouth every morning.  ? tiZANidine (ZANAFLEX) 4 MG tablet Take 4 mg by mouth every 6 (six) hours as needed for muscle spasms.  ? zinc gluconate 50 MG tablet Take 50 mg by mouth daily.  ?  ? ?Allergies:   Ticagrelor, Codeine, Hydrocodone, Hydromorphone, Penicillins, Primidone, and Sulfa antibiotics  ? ?Social History  ? ?Socioeconomic History  ? Marital status: Married  ?  Spouse name: [name]   ? Number of children: 1  ? Years of education: 12+  ? Highest education level: Not on file  ?Occupational History  ? Not on file  ?Tobacco Use  ? Smoking status: Former  ? Smokeless tobacco: Never  ? Tobacco comments:  ?  quit over 40 years ago.Around age [age]   ?Vaping Use  ? Vaping Use: Never used  ?Substance and Sexual Activity  ? Alcohol use: Yes  ?  Alcohol/week: 0.0 standard drinks  ?  Comment: occasional  ? Drug use: No  ? Sexual activity: Not on file  ?Other Topics Concern  ? Not on file  ?Social History Narrative  ? Lives in [place].   ? Patient lives at home with wife   ? Patient has 1 child  ? Patient works at [employer]   ? Patient has 12+ years of education   ? ?Social Determinants of Health  ? ?Financial Resource Strain: Not on file  ?Food Insecurity: Not on file  ?Transportation Needs: Not on file  ?Physical Activity: Not on file  ?Stress: Not on file  ?Social Connections: Not on file  ?  ? ?Family History: ?The patient's family history includes Heart disease in his mother; Kidney Stones in his mother. There is no history of Colon cancer or Esophageal cancer. ? ?ROS:   ?Please see the history of present illness.    ?(+) Shortness of breath ?(+) Chest pain ?All other systems reviewed and are negative. ? ?EKGs/Labs/Other Studies Reviewed:   ? ?The following studies were reviewed today: ? ?[facility]- abnormal with moderate-sized perfusion defect in the basal to mid inferior inferior septal wall worse at stress consistent with ischemia.  3 times daily ratio  was also abnormal at 1.24 suggestive of possible multivessel coronary artery disease.  Ejection fraction was 66%.  ? ?Echo 05/02/2021: ? 1. Left ventricular ejection fraction, by estimation, is 60 to 65%. The  ?left ventricle has normal function. The left ventricle has no regional  ?wall motion abnormalities. Left ventricular diastolic parameters are  ?consistent with Grade I diastolic dysfunction (impaired relaxation).  ? 2. The tricuspid valve is abnormal. Tricuspid valve regurgitation is  ?mild.  ? 3. The aortic valve is tricuspid. Aortic valve regurgitation is not  ?visualized.  ? 4. There is normal pulmonary artery systolic pressure.  ?  5. The inferior vena cava is normal in size with greater than 50%  ?respiratory variability, suggesting right atrial pressure of 3 mmHg.  ? ?Bilateral Carotid Doppler 05/02/2021: ?Summary:  ?Right Carotid: Velocities in the right ICA are consistent with a 1-39%  ?stenosis.  ? ?Left Carotid: Velocities in the left ICA are consistent with a 1-39%  ?stenosis.  ? ?Vertebrals:  Bilateral vertebral arteries demonstrate antegrade flow.  ?Subclavians: Normal flow hemodynamics were seen in bilateral subclavian arteries.  ? ? ?EKG:  EKG is personally reviewed and interpreted. ?01/18/2022: Sinus rhythm. Rate 67 bpm. Septal infarct pattern, nonspecific T wave versions noted.  J-point elevation precordial leads. ? ?Recent Labs: ?09/03/2021: ALT 35; BUN 28; Creatinine, Ser 2.57; Hemoglobin 13.6; Platelets 122; Potassium 4.3; Sodium 138  ? ?Recent Lipid Panel ?   ?Component Value Date/Time  ? CHOL 97 05/02/2021 0341  ? TRIG 84 05/02/2021 0341  ? HDL 25 (L) 05/02/2021 0341  ? CHOLHDL 3.9 05/02/2021 0341  ? VLDL 17 05/02/2021 0341  ? LDLCALC 55 05/02/2021 0341  ? ? ? ?Risk Assessment/Calculations:   ? ? ?    ? ?Physical Exam:   ? ?VS:  BP 128/68   Pulse 67   Ht '5\' 9"'$  (1.753 m)   Wt 186 lb 12.8 oz (84.7 kg)   SpO2 97%   BMI 27.59 kg/m?    ? ?Wt Readings from Last 3 Encounters:  ?01/18/22 186 lb  12.8 oz (84.7 kg)  ?09/03/21 185 lb (83.9 kg)  ?06/24/21 185 lb 5 oz (84.1 kg)  ?  ? ?GEN: Well nourished, well developed in no acute distress ?HEENT: Normal ?NECK: No JVD; No carotid bruits ?LYMPHATICS: No lymph

## 2022-01-17 NOTE — Progress Notes (Signed)
Cardiology Office Note:    Date:  01/18/2022   ID:  Alex Mccarty, DOB 1946/09/16, MRN [id number]  PCP:  [facility], [facility]   [facility] Providers Cardiologist:  None     Referring MD: [facility], [facility]   History of Present Illness:    Alex Mccarty is a 76 y.o. male here for the evaluation of chest pain at the request of Dr. Ellin Mayhew at the [facility].  Today: He is accompanied by a family member, who also provides the history. Today he states he is feeling terrible. In the past month, he is suffering shortness of breath with minimal activity. This is also associated with a "funny feeling" in his central chest. During the most recent episode he actually felt pain in his chest, which was new.  Last year he had an Echo and treadmill stress test. However, he was physically unable to complete the treadmill test. Then a nuclear stress test was performed, suggestive of coronary blockages. A heart cath was recommended. Currently he is wearing a heart monitor, scheduled to be removed on Thursday. A repeat Echo has also been scheduled for 01/25/22 at the [facility]  At home his blood pressure has been fluctuating. He states that for years his blood pressure was averaging 145 systolic. However, in the past 6 months it has been increasing up to 160-199 systolic. They anticipate he will be restarted on amlodipine when he sees his nephrologist tomorrow.  Previously, he has had 3 TIAs. His neurologist had started Plavix initially. They note his most recent TIA was very different compared to the others. The Plavix was thought to be ineffective, so it was switched to Brilinta but he developed an adverse reaction. At this time he is back on Plavix.  He endorses kidney disease. Most recent creatinine was reportedly 2.24-2.5.  He is a former smoker. He quit about 40 years ago, when he was in his 40's.  He denies any palpitations, or peripheral edema. No lightheadedness,  headaches, syncope, orthopnea, or PND.  Previously underwent an emergency bowel resection.  [facility]     Past Medical History:  Diagnosis Date   Arthritis    Bowel obstruction (HCC)    CKD (chronic kidney disease)    unkknown stage per patient   Diabetes mellitus (HCC)    Hyperlipidemia    Hypertension    Kidney stones     Past Surgical History:  Procedure Laterality Date   COLONOSCOPY  10/2015   Dr Glenice Bow   COLOSTOMY REVISION Right 09/29/2017   Procedure: COLON RESECTION RIGHT;  Surgeon: Griselda Miner, MD;  Location: [facility];  Service: General;  Laterality: Right;   ELBOW SURGERY     ulnar nerve repair   ESOPHAGOGASTRODUODENOSCOPY     same time    FOOT SURGERY     crushed heel   LAPAROTOMY N/A 09/29/2017   Procedure: EXPLORATORY LAPAROTOMY;  Surgeon: Chevis Pretty III, MD;  Location: [facility];  Service: General;  Laterality: N/A;    Current Medications: Current Meds  Medication Sig   amLODipine (NORVASC) 2.5 MG tablet Take 1 tablet by mouth daily.   aspirin 81 MG EC tablet Take 1 tablet by mouth daily.   atorvastatin (LIPITOR) 80 MG tablet Take 80 mg by mouth at bedtime.    Cholecalciferol (VITAMIN D3) 50 MCG (2000 UT) TABS Take 1 tablet by mouth daily.   clopidogrel (PLAVIX) 75 MG tablet Take 75 mg by mouth daily.   cyclobenzaprine (  FLEXERIL) 10 MG tablet Take 10 mg by mouth as needed (muscle pain).    empagliflozin (JARDIANCE) 25 MG TABS tablet Take 12.5 mg by mouth daily.   gabapentin (NEURONTIN) 300 MG capsule Take 300 mg by mouth at bedtime.   Glucosamine-Chondroitin (COSAMIN DS PO) Take 1 capsule by mouth at bedtime.   LANTUS SOLOSTAR 100 UNIT/ML Solostar Pen Inject 45 Units into the skin every morning.    losartan (COZAAR) 100 MG tablet Take 100 mg by mouth every evening.   omeprazole (PRILOSEC) 40 MG capsule Take 40 mg by mouth every morning.   primidone (MYSOLINE) 50 MG tablet Take 12.5 mg by mouth at bedtime.   propranolol (INDERAL)  20 MG tablet Take 80 mg by mouth every morning.   tiZANidine (ZANAFLEX) 4 MG tablet Take 4 mg by mouth every 6 (six) hours as needed for muscle spasms.   zinc gluconate 50 MG tablet Take 50 mg by mouth daily.     Allergies:   Ticagrelor, Codeine, Hydrocodone, Hydromorphone, Penicillins, Primidone, and Sulfa antibiotics   Social History   Socioeconomic History   Marital status: Married    Spouse name: Aram Beecham    Number of children: 1   Years of education: 12+   Highest education level: Not on file  Occupational History   Not on file  Tobacco Use   Smoking status: Former   Smokeless tobacco: Never   Tobacco comments:    quit over 40 years ago.Around age [age]   Vaping Use   Vaping Use: Never used  Substance and Sexual Activity   Alcohol use: Yes    Alcohol/week: 0.0 standard drinks    Comment: occasional   Drug use: No   Sexual activity: Not on file  Other Topics Concern   Not on file  Social History Narrative   Lives in [place].    Patient lives at home with wife    Patient has 1 child   Patient works at [employer]    Patient has 12+ years of education    Social Determinants of Corporate investment banker Strain: Not on [employer] Insecurity: Not on file  Transportation Needs: Not on file  Physical Activity: Not on file  Stress: Not on file  Social Connections: Not on file     Family History: The patient's family history includes Heart disease in his mother; Kidney Stones in his mother. There is no history of Colon cancer or Esophageal cancer.  ROS:   Please see the history of present illness.    (+) Shortness of breath (+) Chest pain All other systems reviewed and are negative.  EKGs/Labs/Other Studies Reviewed:    The following studies were reviewed today:  [facility]- abnormal with moderate-sized perfusion defect in the basal to mid inferior inferior septal wall worse at stress consistent with ischemia.  3 times daily ratio  was also abnormal at 1.24 suggestive of possible multivessel coronary artery disease.  Ejection fraction was 66%.   Echo 05/02/2021:  1. Left ventricular ejection fraction, by estimation, is 60 to 65%. The  left ventricle has normal function. The left ventricle has no regional  wall motion abnormalities. Left ventricular diastolic parameters are  consistent with Grade I diastolic dysfunction (impaired relaxation).   2. The tricuspid valve is abnormal. Tricuspid valve regurgitation is  mild.   3. The aortic valve is tricuspid. Aortic valve regurgitation is not  visualized.   4. There is normal pulmonary artery systolic pressure.  5. The inferior vena cava is normal in size with greater than 50%  respiratory variability, suggesting right atrial pressure of 3 mmHg.   Bilateral Carotid Doppler 05/02/2021: Summary:  Right Carotid: Velocities in the right ICA are consistent with a 1-39%  stenosis.   Left Carotid: Velocities in the left ICA are consistent with a 1-39%  stenosis.   Vertebrals:  Bilateral vertebral arteries demonstrate antegrade flow.  Subclavians: Normal flow hemodynamics were seen in bilateral subclavian arteries.    EKG:  EKG is personally reviewed and interpreted. 01/18/2022: Sinus rhythm. Rate 67 bpm. Septal infarct pattern, nonspecific T wave versions noted.  J-point elevation precordial leads.  Recent Labs: 09/03/2021: ALT 35; BUN 28; Creatinine, Ser 2.57; Hemoglobin 13.6; Platelets 122; Potassium 4.3; Sodium 138   Recent Lipid Panel    Component Value Date/Time   CHOL 97 05/02/2021 0341   TRIG 84 05/02/2021 0341   HDL 25 (L) 05/02/2021 0341   CHOLHDL 3.9 05/02/2021 0341   VLDL 17 05/02/2021 0341   LDLCALC 55 05/02/2021 0341     Risk Assessment/Calculations:          Physical Exam:    VS:  BP 128/68   Pulse 67   Ht 5\' 9"  (1.753 m)   Wt 186 lb 12.8 oz (84.7 kg)   SpO2 97%   BMI 27.59 kg/m     Wt Readings from Last 3 Encounters:  01/18/22 186 lb  12.8 oz (84.7 kg)  09/03/21 185 lb (83.9 kg)  06/24/21 185 lb 5 oz (84.1 kg)     GEN: Well nourished, well developed in no acute distress HEENT: Normal NECK: No JVD; No carotid bruits LYMPHATICS: No lymphadenopathy CARDIAC: RRR, 1/6 systolic murmur, rubs, gallops RESPIRATORY:  Clear to auscultation without rales, wheezing or rhonchi  ABDOMEN: Soft, non-tender, protuberant, ventral hernias noted MUSCULOSKELETAL:  No edema; No deformity  SKIN: Warm and dry NEUROLOGIC:  Alert and oriented x 3 PSYCHIATRIC:  Normal affect   ASSESSMENT:    1. Pre-procedure lab exam   2. Angina pectoris (HCC)   3. Dyspnea on exertion   4. Chronic kidney disease, stage IV (severe) (HCC)   5. Type 2 diabetes mellitus with nephropathy (HCC)    PLAN:    In order of problems listed above:  Dyspnea on exertion We will check an echocardiogram here.  Chronic kidney disease, stage IV (severe) (HCC) Creatinine 2.2 at [place].  Checking labs today.  Precath hydration.  Holding losartan prior to cath.  He is going to see his nephrologist tomorrow.  Type 2 diabetes mellitus with nephropathy (HCC) Prior TIAs.  On Plavix.  On atorvastatin high intensity.  Continue.  Just started Jardiance.  Angina pectoris (HCC) [facility] stress [facility]- abnormal with moderate-sized perfusion defect in the basal to mid inferior inferior septal wall worse at stress consistent with ischemia.  3 times daily ratio was also abnormal at 1.24 suggestive of possible multivessel coronary artery disease.  Ejection fraction was 66%.  He continues to have exertional type substernal chest discomfort that is relieved with rest.  Concerning for angina.  His creatinine has been monitored by nephrology at the [facility] and he is currently in the 2.2 range.  We have discussed the risks and benefits of cardiac catheterization including stroke heart attack death renal impairment bleeding and he and his wife are willing to proceed.  Given his chronic kidney  disease stage IV, we will bring in early in the morning for precath hydration.  I have also asked  him to hold his losartan 100 mg once a day prior to heart catheterization.  2 days prior.  If he must, he can start amlodipine.  He is seeing his nephrologist tomorrow.  We had a careful discussion given his chronic kidney disease that even with dye sparing procedure and hydration there is a small risk of worsening renal function or perhaps acute renal failure which is most commonly transient in the setting of dye administration.  If symptoms worsen or become worrisome he knows to seek medical attention.  Follow-up: Repeat Echo.  Medication Adjustments/Labs and Tests Ordered: Current medicines are reviewed at length with the patient today.  Concerns regarding medicines are outlined above.   Orders Placed This Encounter  Procedures   Basic metabolic panel   CBC   EKG 12-Lead   ECHOCARDIOGRAM COMPLETE   No orders of the defined types were placed in this encounter.  Patient Instructions  Medication Instructions:  The current medical regimen is effective;  continue present plan and medications.  *If you need a refill on your cardiac medications before your next appointment, please call your pharmacy*   Lab Work: Please have blood work today (CBC, BMP)  If you have labs (blood work) drawn today and your tests are completely normal, you will receive your results only by: MyChart Message (if you have MyChart) OR A paper copy in the mail If you have any lab test that is abnormal or we need to change your treatment, we will call you to review the results.   Testing/Procedures: Your physician has requested that you have an echocardiogram. Echocardiography is a painless test that uses sound waves to create images of your heart. It provides your doctor with information about the size and shape of your heart and how well your heart's chambers and valves are working. This procedure takes  approximately one hour. There are no restrictions for this procedure.    [facility] CARDIOVASCULAR DIVISION [facility] ST OFFICE [address] [place], [address] [place] [address] Dept: [phone] Loc: [phone]  Alex Mccarty  01/18/2022  You are scheduled for a Cardiac Catheterization on Friday, May 5 with Dr. Lance Muss.  1. Please arrive at the [facility] (Main Entrance A) at [facility]: [address] [place], [place] [address] at 6:00 AM (two hours before your procedure to ensure your preparation). Free valet parking service is available.   Special note: Every effort is made to have your procedure done on time. Please understand that emergencies sometimes delay scheduled procedures.  2. Diet: Do not eat or drink anything after midnight prior to your procedure except sips of water to take medications.  3. Labs:  please have blood work today (CBC,BMP)  4. Medication instructions in preparation for your procedure: Losartan - hold 2 days before (Wednesday, May 3, Do not take any Insulin the morning of your procedure.  On the morning of your procedure, take your Aspirin and Plavix/Clopidogrel and any morning medicines NOT listed above.  You may use sips of water.  5. Plan for one night stay--bring personal belongings. 6. Bring a current list of your medications and current insurance cards. 7. You MUST have a responsible person to drive you home. 8. Someone MUST be with you the first 24 hours after you arrive home or your discharge will be delayed. 9. Please wear clothes that are easy to get on and off and wear slip-on shoes.  Thank you for allowing [place] to  care for you!   -- [place] [facility]   Follow-Up: At [facility], you and your health needs are our priority.  As part of our continuing mission to provide you with exceptional heart care, we have created designated Provider Care Teams.   These Care Teams include your primary Cardiologist (physician) and Advanced Practice Providers (APPs -  Physician Assistants and Nurse Practitioners) who all work together to provide you with the care you need, when you need it.  We recommend signing up for the patient portal called "MyChart".  Sign up information is provided on this After Visit Summary.  MyChart is used to connect with patients for Virtual Visits (Telemedicine).  Patients are able to view lab/test results, encounter notes, upcoming appointments, etc.  Non-urgent messages can be sent to your provider as well.   To learn more about what you can do with MyChart, go to [email or web address].    Your next appointment:   Will be determined after the above testing.   Important Information About Sugar         I,Mathew Stumpf,acting as a scribe for Donato Schultz, MD.,have documented all relevant documentation on the behalf of Donato Schultz, MD,as directed by  Donato Schultz, MD while in the presence of Donato Schultz, MD.  I, Donato Schultz, MD, have reviewed all documentation for this visit. The documentation on 01/18/22 for the exam, diagnosis, procedures, and orders are all accurate and complete.   Signed, Donato Schultz, MD  01/18/2022 4:04 PM    [facility]

## 2022-01-18 ENCOUNTER — Encounter: Payer: Self-pay | Admitting: Cardiology

## 2022-01-18 ENCOUNTER — Ambulatory Visit (INDEPENDENT_AMBULATORY_CARE_PROVIDER_SITE_OTHER): Payer: No Typology Code available for payment source | Admitting: Cardiology

## 2022-01-18 VITALS — BP 128/68 | HR 67 | Ht 69.0 in | Wt 186.8 lb

## 2022-01-18 DIAGNOSIS — I209 Angina pectoris, unspecified: Secondary | ICD-10-CM | POA: Diagnosis not present

## 2022-01-18 DIAGNOSIS — N184 Chronic kidney disease, stage 4 (severe): Secondary | ICD-10-CM | POA: Diagnosis not present

## 2022-01-18 DIAGNOSIS — E1121 Type 2 diabetes mellitus with diabetic nephropathy: Secondary | ICD-10-CM

## 2022-01-18 DIAGNOSIS — Z01812 Encounter for preprocedural laboratory examination: Secondary | ICD-10-CM

## 2022-01-18 DIAGNOSIS — R0609 Other forms of dyspnea: Secondary | ICD-10-CM

## 2022-01-18 NOTE — Assessment & Plan Note (Signed)
We will check an echocardiogram here. ?

## 2022-01-18 NOTE — Assessment & Plan Note (Signed)
Prior TIAs.  On Plavix.  On atorvastatin high intensity.  Continue.  Just started Jardiance. ?

## 2022-01-18 NOTE — Assessment & Plan Note (Signed)
Creatinine 2.2 at [place].  Checking labs today.  Precath hydration.  Holding losartan prior to cath.  He is going to see his nephrologist tomorrow. ?

## 2022-01-18 NOTE — Patient Instructions (Signed)
Medication Instructions:  ?The current medical regimen is effective;  continue present plan and medications. ? ?*If you need a refill on your cardiac medications before your next appointment, please call your pharmacy* ? ? ?Lab Work: ?Please have blood work today (CBC, BMP) ? ?If you have labs (blood work) drawn today and your tests are completely normal, you will receive your results only by: ?MyChart Message (if you have MyChart) OR ?A paper copy in the mail ?If you have any lab test that is abnormal or we need to change your treatment, we will call you to review the results. ? ? ?Testing/Procedures: ?Your physician has requested that you have an echocardiogram. Echocardiography is a painless test that uses sound waves to create images of your heart. It provides your doctor with information about the size and shape of your heart and how well your heart?s chambers and valves are working. This procedure takes approximately one hour. There are no restrictions for this procedure. ? ? ? ?[place] ?[place] OFFICE ?[address], [address] ?[place] [address] ?Dept: [phone] ?Loc: [phone] ? ?Alex Mccarty  01/18/2022 ? ?You are scheduled for a Cardiac Catheterization on Friday, May 5 with Dr. Larae Grooms. ? ?1. Please arrive at the [facility] (Main Entrance A) at [facility]: [address] [place], [place] [address] at 6:00 AM (two hours before your procedure to ensure your preparation). Free valet parking service is available.  ? ?Special note: Every effort is made to have your procedure done on time. Please understand that emergencies sometimes delay scheduled procedures. ? ?2. Diet: Do not eat or drink anything after midnight prior to your procedure except sips of water to take medications. ? ?3. Labs:  please have blood work today (CBC,BMP) ? ?4. Medication instructions in preparation for your procedure: ?Losartan -  hold 2 days before (Wednesday, May 3, ?Do not take any Insulin the morning of your procedure. ? ?On the morning of your procedure, take your Aspirin and Plavix/Clopidogrel and any morning medicines NOT listed above.  You may use sips of water. ? ?5. Plan for one night stay--bring personal belongings. ?6. Bring a current list of your medications and current insurance cards. ?7. You MUST have a responsible person to drive you home. ?8. Someone MUST be with you the first 24 hours after you arrive home or your discharge will be delayed. ?9. Please wear clothes that are easy to get on and off and wear slip-on shoes. ? ?Thank you for allowing [place] to care for you! ?  -- [place] [facility] ? ? ?Follow-Up: ?At [facility], you and your health needs are our priority.  As part of our continuing mission to provide you with exceptional heart care, we have created designated Provider Care Teams.  These Care Teams include your primary Cardiologist (physician) and Advanced Practice Providers (APPs -  Physician Assistants and Nurse Practitioners) who all work together to provide you with the care you need, when you need it. ? ?We recommend signing up for the patient portal called "MyChart".  Sign up information is provided on this After Visit Summary.  MyChart is used to connect with patients for Virtual Visits (Telemedicine).  Patients are able to view lab/test results, encounter notes, upcoming appointments, etc.  Non-urgent messages can be sent to your provider as well.   ?To learn more about what you can do with MyChart, go to [email or web address].   ? ?  Your next appointment:   ?Will be determined after the above testing. ? ? ?Important Information About Sugar ? ? ? ? ?  ?

## 2022-01-18 NOTE — Assessment & Plan Note (Signed)
Nuclear stress test [facility]- abnormal with moderate-sized perfusion defect in the basal to mid inferior inferior septal wall worse at stress consistent with ischemia.  3 times daily ratio was also abnormal at 1.24 suggestive of possible multivessel coronary artery disease.  Ejection fraction was 66%.  He continues to have exertional type substernal chest discomfort that is relieved with rest.  Concerning for angina.  His creatinine has been monitored by nephrology at the [facility] and he is currently in the 2.2 range. ? ?We have discussed the risks and benefits of cardiac catheterization including stroke heart attack death renal impairment bleeding and he and his wife are willing to proceed.  Given his chronic kidney disease stage IV, we will bring in early in the morning for precath hydration.  I have also asked him to hold his losartan 100 mg once a day prior to heart catheterization.  2 days prior.  If he must, he can start amlodipine.  He is seeing his nephrologist tomorrow. ? ?We had a careful discussion given his chronic kidney disease that even with dye sparing procedure and hydration there is a small risk of worsening renal function or perhaps acute renal failure which is most commonly transient in the setting of dye administration. ? ?

## 2022-01-19 LAB — BASIC METABOLIC PANEL
BUN/Creatinine Ratio: 10 (ref 10–24)
BUN: 24 mg/dL (ref 8–27)
CO2: 24 mmol/L (ref 20–29)
Calcium: 9.5 mg/dL (ref 8.6–10.2)
Chloride: 107 mmol/L — ABNORMAL HIGH (ref 96–106)
Creatinine, Ser: 2.52 mg/dL — ABNORMAL HIGH (ref 0.76–1.27)
Glucose: 125 mg/dL — ABNORMAL HIGH (ref 70–99)
Potassium: 5.1 mmol/L (ref 3.5–5.2)
Sodium: 141 mmol/L (ref 134–144)
eGFR: 26 mL/min/{1.73_m2} — ABNORMAL LOW (ref >59)

## 2022-01-19 LAB — CBC
Hematocrit: 44.7 % (ref 37.5–51.0)
Hemoglobin: 14.5 g/dL (ref 13.0–17.7)
MCH: 30.1 pg (ref 26.6–33.0)
MCHC: 32.4 g/dL (ref 31.5–35.7)
MCV: 93 fL (ref 79–97)
RBC: 4.81 x10E6/uL (ref 4.14–5.80)
RDW: 13.4 % (ref 11.6–15.4)
WBC: 7.1 10*3/uL (ref 3.4–10.8)

## 2022-01-20 ENCOUNTER — Telehealth: Payer: Self-pay | Admitting: *Deleted

## 2022-01-20 ENCOUNTER — Other Ambulatory Visit: Payer: Self-pay | Admitting: Cardiology

## 2022-01-20 DIAGNOSIS — I200 Unstable angina: Secondary | ICD-10-CM

## 2022-01-20 NOTE — Telephone Encounter (Addendum)
Cardiac Catheterization scheduled at [facility] for: Friday Jan 21, 2022 10:30 AM ?Arrival time and place: [place] Entrance A at: 5:30 AM-pre-procedure hydration/needs CBC ? ? ?No solid food after midnight prior to cath, clear liquids until 5 AM day of procedure. ? ?Medication instructions: ?-Hold: ? Losartan-day before and day of procedure-per protocol GFR 26-held Wed and Thurs PM dose ? Jardiance-AM of procedure ? Insulin-AM of procedure ?-Except hold medications usual morning medications can be taken with sips of water including aspirin 81 mg and Plavix 75 mg. ? ?Confirmed patient has responsible adult to drive home post procedure and be with patient first 24 hours after arriving home. ? ?Patient reports no new symptoms concerning for COVID-19/no exposure to COVID-19 in the past 10 days. ? ?Reviewed procedure instructions with patient. ?

## 2022-01-20 NOTE — Telephone Encounter (Signed)
Reviewed procedure instructions with patient's wife (DPR).  

## 2022-01-20 NOTE — Telephone Encounter (Signed)
Follow Up: ? ? ? ?Patient's wife said you were talking earlier and the phone was disconnected. ?

## 2022-01-21 ENCOUNTER — Telehealth: Payer: Self-pay | Admitting: Cardiology

## 2022-01-21 ENCOUNTER — Encounter (HOSPITAL_COMMUNITY)
Admission: RE | Disposition: A | Discharge status: Home / Self Care | Payer: Self-pay | Source: Home / Self Care | Attending: Interventional Cardiology

## 2022-01-21 ENCOUNTER — Ambulatory Visit (HOSPITAL_COMMUNITY)
Admission: RE | Admit: 2022-01-21 | Discharge: 2022-01-21 | Disposition: A | Discharge status: Home / Self Care | Payer: No Typology Code available for payment source | Attending: Interventional Cardiology | Admitting: Interventional Cardiology

## 2022-01-21 DIAGNOSIS — N184 Chronic kidney disease, stage 4 (severe): Secondary | ICD-10-CM | POA: Diagnosis not present

## 2022-01-21 DIAGNOSIS — R9439 Abnormal result of other cardiovascular function study: Secondary | ICD-10-CM

## 2022-01-21 DIAGNOSIS — I129 Hypertensive chronic kidney disease with stage 1 through stage 4 chronic kidney disease, or unspecified chronic kidney disease: Secondary | ICD-10-CM | POA: Diagnosis not present

## 2022-01-21 DIAGNOSIS — E1122 Type 2 diabetes mellitus with diabetic chronic kidney disease: Secondary | ICD-10-CM | POA: Insufficient documentation

## 2022-01-21 DIAGNOSIS — I253 Aneurysm of heart: Secondary | ICD-10-CM | POA: Insufficient documentation

## 2022-01-21 DIAGNOSIS — Z7984 Long term (current) use of oral hypoglycemic drugs: Secondary | ICD-10-CM | POA: Insufficient documentation

## 2022-01-21 DIAGNOSIS — R0602 Shortness of breath: Secondary | ICD-10-CM | POA: Insufficient documentation

## 2022-01-21 DIAGNOSIS — I2510 Atherosclerotic heart disease of native coronary artery without angina pectoris: Secondary | ICD-10-CM | POA: Diagnosis not present

## 2022-01-21 DIAGNOSIS — R079 Chest pain, unspecified: Secondary | ICD-10-CM | POA: Insufficient documentation

## 2022-01-21 DIAGNOSIS — I25119 Atherosclerotic heart disease of native coronary artery with unspecified angina pectoris: Secondary | ICD-10-CM | POA: Diagnosis not present

## 2022-01-21 DIAGNOSIS — Z794 Long term (current) use of insulin: Secondary | ICD-10-CM | POA: Diagnosis not present

## 2022-01-21 DIAGNOSIS — Z87891 Personal history of nicotine dependence: Secondary | ICD-10-CM | POA: Insufficient documentation

## 2022-01-21 DIAGNOSIS — I200 Unstable angina: Secondary | ICD-10-CM

## 2022-01-21 HISTORY — PX: LEFT HEART CATH AND CORONARY ANGIOGRAPHY: CATH118249

## 2022-01-21 LAB — GLUCOSE, CAPILLARY
Glucose-Capillary: 126 mg/dL — ABNORMAL HIGH (ref 70–99)
Glucose-Capillary: 91 mg/dL (ref 70–99)

## 2022-01-21 LAB — CBC
HCT: 41.9 % (ref 39.0–52.0)
Hemoglobin: 13.5 g/dL (ref 13.0–17.0)
MCH: 30.4 pg (ref 26.0–34.0)
MCHC: 32.2 g/dL (ref 30.0–36.0)
MCV: 94.4 fL (ref 80.0–100.0)
Platelets: 134 10*3/uL — ABNORMAL LOW (ref 150–400)
RBC: 4.44 MIL/uL (ref 4.22–5.81)
RDW: 14.4 % (ref 11.5–15.5)
WBC: 6.3 10*3/uL (ref 4.0–10.5)
nRBC: 0 % (ref 0.0–0.2)

## 2022-01-21 SURGERY — LEFT HEART CATH AND CORONARY ANGIOGRAPHY
Anesthesia: LOCAL

## 2022-01-21 MED ORDER — HYDRALAZINE HCL 20 MG/ML IJ SOLN: 10.0000 mg | INTRAMUSCULAR | Status: DC | PRN

## 2022-01-21 MED ORDER — ACETAMINOPHEN 325 MG PO TABS: 650.0000 mg | ORAL_TABLET | ORAL | Status: DC | PRN

## 2022-01-21 MED ORDER — MIDAZOLAM HCL 2 MG/2ML IJ SOLN
INTRAMUSCULAR | Status: AC
  Filled 2022-01-21: qty 2

## 2022-01-21 MED ORDER — SODIUM CHLORIDE 0.9% FLUSH: 3.0000 mL | INTRAVENOUS | Status: DC | PRN

## 2022-01-21 MED ORDER — FENTANYL CITRATE (PF) 100 MCG/2ML IJ SOLN
INTRAMUSCULAR | Status: DC | PRN
Start: 2022-01-21 — End: 2022-01-21
  Administered 2022-01-21: 25 ug via INTRAVENOUS

## 2022-01-21 MED ORDER — ASPIRIN 81 MG PO CHEW: 81.0000 mg | CHEWABLE_TABLET | ORAL | Status: DC

## 2022-01-21 MED ORDER — SODIUM CHLORIDE 0.9 % IV SOLN: INTRAVENOUS | Status: DC

## 2022-01-21 MED ORDER — LIDOCAINE HCL (PF) 1 % IJ SOLN
INTRAMUSCULAR | Status: DC | PRN
  Administered 2022-01-21 (×2): 5 mL

## 2022-01-21 MED ORDER — MIDAZOLAM HCL 2 MG/2ML IJ SOLN
INTRAMUSCULAR | Status: DC | PRN
  Administered 2022-01-21: 2 mg via INTRAVENOUS

## 2022-01-21 MED ORDER — SODIUM CHLORIDE 0.9 % IV SOLN: 250.0000 mL | INTRAVENOUS | Status: DC | PRN

## 2022-01-21 MED ORDER — SODIUM CHLORIDE 0.9% FLUSH: 3.0000 mL | Freq: Two times a day (BID) | INTRAVENOUS | Status: DC

## 2022-01-21 MED ORDER — VERAPAMIL HCL 2.5 MG/ML IV SOLN: INTRAVENOUS | Status: DC | PRN

## 2022-01-21 MED ORDER — HEPARIN SODIUM (PORCINE) 1000 UNIT/ML IJ SOLN
INTRAMUSCULAR | Status: AC
  Filled 2022-01-21: qty 10

## 2022-01-21 MED ORDER — SODIUM CHLORIDE 0.9 % WEIGHT BASED INFUSION
1.0000 mL/kg/h | INTRAVENOUS | Status: DC
  Administered 2022-01-21: 1 mL/kg/h via INTRAVENOUS

## 2022-01-21 MED ORDER — HEPARIN SODIUM (PORCINE) 1000 UNIT/ML IJ SOLN
INTRAMUSCULAR | Status: DC | PRN
Start: 2022-01-21 — End: 2022-01-21
  Administered 2022-01-21: 4000 [IU] via INTRAVENOUS

## 2022-01-21 MED ORDER — LABETALOL HCL 5 MG/ML IV SOLN: 10.0000 mg | INTRAVENOUS | Status: DC | PRN

## 2022-01-21 MED ORDER — LIDOCAINE HCL (PF) 1 % IJ SOLN
INTRAMUSCULAR | Status: AC
  Filled 2022-01-21: qty 30

## 2022-01-21 MED ORDER — IOHEXOL 350 MG/ML SOLN
INTRAVENOUS | Status: DC | PRN
  Administered 2022-01-21: 50 mL

## 2022-01-21 MED ORDER — HEPARIN (PORCINE) IN NACL 1000-0.9 UT/500ML-% IV SOLN
INTRAVENOUS | Status: DC | PRN
  Administered 2022-01-21 (×2): 500 mL

## 2022-01-21 MED ORDER — HEPARIN (PORCINE) IN NACL 1000-0.9 UT/500ML-% IV SOLN
INTRAVENOUS | Status: AC
  Filled 2022-01-21: qty 1000

## 2022-01-21 MED ORDER — ONDANSETRON HCL 4 MG/2ML IJ SOLN: 4.0000 mg | Freq: Four times a day (QID) | INTRAMUSCULAR | Status: DC | PRN

## 2022-01-21 MED ORDER — SODIUM CHLORIDE 0.9 % WEIGHT BASED INFUSION
3.0000 mL/kg/h | INTRAVENOUS | Status: AC
  Administered 2022-01-21: 3 mL/kg/h via INTRAVENOUS

## 2022-01-21 MED ORDER — VERAPAMIL HCL 2.5 MG/ML IV SOLN
INTRAVENOUS | Status: AC
  Filled 2022-01-21: qty 2

## 2022-01-21 MED ORDER — FENTANYL CITRATE (PF) 100 MCG/2ML IJ SOLN
INTRAMUSCULAR | Status: AC
Start: 2022-01-21 — End: ?
  Filled 2022-01-21: qty 2

## 2022-01-21 SURGICAL SUPPLY — 10 items
CATH 5FR JL3.5 JR4 ANG PIG MP (CATHETERS) ×1 IMPLANT
DEVICE RAD COMP TR BAND LRG (VASCULAR PRODUCTS) ×1 IMPLANT
GLIDESHEATH SLEND SS 6F .021 (SHEATH) ×1 IMPLANT
GUIDEWIRE INQWIRE 1.5J.035X260 (WIRE) IMPLANT
INQWIRE 1.5J .035X260CM (WIRE) ×2
KIT HEART LEFT (KITS) ×2 IMPLANT
PACK CARDIAC CATHETERIZATION (CUSTOM PROCEDURE TRAY) ×2 IMPLANT
SYR MEDRAD MARK 7 150ML (SYRINGE) ×2 IMPLANT
TRANSDUCER W/STOPCOCK (MISCELLANEOUS) ×2 IMPLANT
TUBING CIL FLEX 10 FLL-RA (TUBING) ×2 IMPLANT

## 2022-01-21 NOTE — Interval H&P Note (Signed)
Cath Lab Visit (complete for each Cath Lab visit) ? ?Clinical Evaluation Leading to the Procedure:  ? ?ACS: No. ? ?Non-ACS:   ? ?Anginal Classification: CCS III ? ?Anti-ischemic medical therapy: Minimal Therapy (1 class of medications) ? ?Non-Invasive Test Results: No non-invasive testing performed ? ?Prior CABG: No previous CABG ? ? ? ? ? ?History and Physical Interval Note: ? ?01/21/2022 ?11:25 AM ? ?Alex Mccarty  has presented today for surgery, with the diagnosis of sob - cp.  The various methods of treatment have been discussed with the patient and family. After consideration of risks, benefits and other options for treatment, the patient has consented to  Procedure(s): ?LEFT HEART CATH AND CORONARY ANGIOGRAPHY (N/A) as a surgical intervention.  The patient's history has been reviewed, patient examined, no change in status, stable for surgery.  I have reviewed the patient's chart and labs.  Questions were answered to the patient's satisfaction.   ? ? ?Larae Grooms ? ? ?

## 2022-01-21 NOTE — Telephone Encounter (Signed)
Pt c/o medication issue: ? ?1. Name of Medication:  ? empagliflozin (JARDIANCE) 25 MG TABS tablet  ? ? ?2. How are you currently taking this medication (dosage and times per day)? Take 12.5 mg by mouth daily. ? ?3. Are you having a reaction (difficulty breathing--STAT)? No ? ?4. What is your medication issue?  Pt's wife states that pt was discharged from hospital today from having a Heart Cath. Wife wants to know when pt should start back taking medication. Wife states that pt's Kidney doctor wanted him to not take until Monday 5/8 but she wanted to check with Dr. Marlou Porch as well. Please advise ? ?

## 2022-01-21 NOTE — Telephone Encounter (Signed)
Spoke with wife [name] and advised to follow discharge instructions to hold Jardiance until Monday.  She states understanding and was grateful for the call back and information.   ?

## 2022-01-24 ENCOUNTER — Encounter: Payer: Self-pay | Admitting: Cardiology

## 2022-01-24 ENCOUNTER — Encounter (HOSPITAL_COMMUNITY): Payer: Self-pay | Admitting: Interventional Cardiology

## 2022-01-24 ENCOUNTER — Telehealth: Payer: Self-pay | Admitting: *Deleted

## 2022-01-24 DIAGNOSIS — Z01812 Encounter for preprocedural laboratory examination: Secondary | ICD-10-CM

## 2022-01-24 DIAGNOSIS — I2510 Atherosclerotic heart disease of native coronary artery without angina pectoris: Secondary | ICD-10-CM

## 2022-01-24 DIAGNOSIS — N184 Chronic kidney disease, stage 4 (severe): Secondary | ICD-10-CM

## 2022-01-24 NOTE — Telephone Encounter (Signed)
Subject: Cath for Dr. Zara Chess - planned PCI of R*  ? ?Please set up cath for Friday with Dr. Irish Lack. Planned PCI of RCA. Also have him come in for hydrate the day of the cath just as he did before. Creatinine was 2.5 range.  ? ?Thanks.  ? ?-Candee Furbish, MD   ? ?Try to see if he can come in Thursday for BMET prior to cath Friday as well  ?Thanks  ? ?Candee Furbish, MD  ? ?Order placed for lab per Dr Marlou Porch ?Pt scheduled with Dr Irish Lack on Friday May 12th.  Pt to report at 6 am for hydration - procedure at 10:30 AM. ? ?Hold Jardiance AM of  ?Hold Lantus AM of  ?Hold Losartan 2 doses prior to procedure. ? ? ?Instructions sent to pt via MyChart. ? ?

## 2022-01-24 NOTE — Telephone Encounter (Signed)
Reviewed orders/instructions with pt's wife who states understanding.  She had no questions at the end of the call but is aware to c/b with any questions/concerns.   ?

## 2022-01-25 ENCOUNTER — Other Ambulatory Visit: Payer: Self-pay | Admitting: *Deleted

## 2022-01-25 DIAGNOSIS — Z01812 Encounter for preprocedural laboratory examination: Secondary | ICD-10-CM

## 2022-01-25 DIAGNOSIS — I2510 Atherosclerotic heart disease of native coronary artery without angina pectoris: Secondary | ICD-10-CM

## 2022-01-25 NOTE — Progress Notes (Signed)
Pre-procedure STAT CBC added to lab appointment 01/27/22 ?

## 2022-01-27 ENCOUNTER — Telehealth: Payer: Self-pay | Admitting: *Deleted

## 2022-01-27 ENCOUNTER — Other Ambulatory Visit: Payer: No Typology Code available for payment source

## 2022-01-27 DIAGNOSIS — N184 Chronic kidney disease, stage 4 (severe): Secondary | ICD-10-CM

## 2022-01-27 DIAGNOSIS — I2510 Atherosclerotic heart disease of native coronary artery without angina pectoris: Secondary | ICD-10-CM

## 2022-01-27 DIAGNOSIS — Z01812 Encounter for preprocedural laboratory examination: Secondary | ICD-10-CM

## 2022-01-27 LAB — BASIC METABOLIC PANEL
BUN/Creatinine Ratio: 11 (ref 10–24)
BUN: 27 mg/dL (ref 8–27)
CO2: 26 mmol/L (ref 20–29)
Calcium: 9.1 mg/dL (ref 8.6–10.2)
Chloride: 107 mmol/L — ABNORMAL HIGH (ref 96–106)
Creatinine, Ser: 2.56 mg/dL — ABNORMAL HIGH (ref 0.76–1.27)
Glucose: 232 mg/dL — ABNORMAL HIGH (ref 70–99)
Potassium: 5.3 mmol/L — ABNORMAL HIGH (ref 3.5–5.2)
Sodium: 139 mmol/L (ref 134–144)
eGFR: 26 mL/min/{1.73_m2} — ABNORMAL LOW (ref >59)

## 2022-01-27 LAB — CBC
Hematocrit: 40.9 % (ref 37.5–51.0)
Hemoglobin: 13.3 g/dL (ref 13.0–17.7)
MCH: 30.1 pg (ref 26.6–33.0)
MCHC: 32.5 g/dL (ref 31.5–35.7)
MCV: 93 fL (ref 79–97)
Platelets: 113 10*3/uL — ABNORMAL LOW (ref 150–450)
RBC: 4.42 x10E6/uL (ref 4.14–5.80)
RDW: 15.8 % — ABNORMAL HIGH (ref 11.6–15.4)
WBC: 5.4 10*3/uL (ref 3.4–10.8)

## 2022-01-27 NOTE — Telephone Encounter (Signed)
Coronary Stent scheduled at [facility] for: Friday Jan 28, 2022 10:30 AM ?Arrival time and place: [place] Entrance A at: 5:30 AM-pre-procedure hydration ? ? ?Nothing to eat after midnight prior to procedure, clear liquids until 5 AM day of procedure. ? ?Medication instructions: ?-Hold: ? Insulin-AM of procedure ? Jardiance-AM of procedure  ? Losartan-PM dose 01/26/22 and 01/27/22 per protocol  ?-Except hold medications usual morning medications can be taken with sips of water including aspirin 81 mg and Plavix 75 mg. ? ?Confirmed patient has responsible adult to drive home post procedure and be with patient first 24 hours after arriving home. ? ?Patient reports no new symptoms concerning for COVID-19/no exposure to COVID-19 in the past 10 days. ? ?Reviewed procedure instructions with patient. ?

## 2022-01-28 ENCOUNTER — Encounter (HOSPITAL_COMMUNITY)
Admission: RE | Disposition: A | Discharge status: Home / Self Care | Payer: Self-pay | Source: Home / Self Care | Attending: Interventional Cardiology

## 2022-01-28 ENCOUNTER — Other Ambulatory Visit (HOSPITAL_COMMUNITY): Payer: No Typology Code available for payment source

## 2022-01-28 ENCOUNTER — Other Ambulatory Visit: Payer: Self-pay

## 2022-01-28 ENCOUNTER — Ambulatory Visit (HOSPITAL_COMMUNITY)
Admission: RE | Admit: 2022-01-28 | Discharge: 2022-01-28 | Disposition: A | Discharge status: Home / Self Care | Payer: No Typology Code available for payment source | Attending: Interventional Cardiology | Admitting: Interventional Cardiology

## 2022-01-28 ENCOUNTER — Other Ambulatory Visit (HOSPITAL_COMMUNITY): Payer: Self-pay

## 2022-01-28 DIAGNOSIS — N184 Chronic kidney disease, stage 4 (severe): Secondary | ICD-10-CM | POA: Diagnosis not present

## 2022-01-28 DIAGNOSIS — R0609 Other forms of dyspnea: Secondary | ICD-10-CM | POA: Insufficient documentation

## 2022-01-28 DIAGNOSIS — E1122 Type 2 diabetes mellitus with diabetic chronic kidney disease: Secondary | ICD-10-CM | POA: Insufficient documentation

## 2022-01-28 DIAGNOSIS — I25118 Atherosclerotic heart disease of native coronary artery with other forms of angina pectoris: Secondary | ICD-10-CM | POA: Insufficient documentation

## 2022-01-28 DIAGNOSIS — Z955 Presence of coronary angioplasty implant and graft: Secondary | ICD-10-CM | POA: Diagnosis not present

## 2022-01-28 DIAGNOSIS — Z87891 Personal history of nicotine dependence: Secondary | ICD-10-CM | POA: Insufficient documentation

## 2022-01-28 DIAGNOSIS — Z794 Long term (current) use of insulin: Secondary | ICD-10-CM | POA: Diagnosis not present

## 2022-01-28 DIAGNOSIS — E785 Hyperlipidemia, unspecified: Secondary | ICD-10-CM | POA: Diagnosis present

## 2022-01-28 DIAGNOSIS — I2510 Atherosclerotic heart disease of native coronary artery without angina pectoris: Secondary | ICD-10-CM

## 2022-01-28 DIAGNOSIS — I129 Hypertensive chronic kidney disease with stage 1 through stage 4 chronic kidney disease, or unspecified chronic kidney disease: Secondary | ICD-10-CM | POA: Insufficient documentation

## 2022-01-28 DIAGNOSIS — E1121 Type 2 diabetes mellitus with diabetic nephropathy: Secondary | ICD-10-CM | POA: Diagnosis present

## 2022-01-28 DIAGNOSIS — I10 Essential (primary) hypertension: Secondary | ICD-10-CM | POA: Diagnosis present

## 2022-01-28 HISTORY — PX: CORONARY STENT INTERVENTION: CATH118234

## 2022-01-28 HISTORY — PX: INTRAVASCULAR ULTRASOUND/IVUS: CATH118244

## 2022-01-28 HISTORY — PX: LEFT HEART CATH: CATH118248

## 2022-01-28 LAB — GLUCOSE, CAPILLARY
Glucose-Capillary: 177 mg/dL — ABNORMAL HIGH (ref 70–99)
Glucose-Capillary: 102 mg/dL — ABNORMAL HIGH (ref 70–99)

## 2022-01-28 LAB — POCT ACTIVATED CLOTTING TIME
Activated Clotting Time: 287 seconds
Activated Clotting Time: 372 seconds

## 2022-01-28 SURGERY — CORONARY STENT INTERVENTION
Anesthesia: LOCAL

## 2022-01-28 MED ORDER — CLOPIDOGREL BISULFATE 75 MG PO TABS: 75.0000 mg | ORAL_TABLET | Freq: Every day | ORAL | Status: DC

## 2022-01-28 MED ORDER — SODIUM CHLORIDE 0.9% FLUSH: 3.0000 mL | Freq: Two times a day (BID) | INTRAVENOUS | Status: DC

## 2022-01-28 MED ORDER — MIDAZOLAM HCL 2 MG/2ML IJ SOLN
INTRAMUSCULAR | Status: AC
  Filled 2022-01-28: qty 2

## 2022-01-28 MED ORDER — ASPIRIN 81 MG PO CHEW: 81.0000 mg | CHEWABLE_TABLET | Freq: Every day | ORAL | Status: DC

## 2022-01-28 MED ORDER — MIDAZOLAM HCL 2 MG/2ML IJ SOLN
INTRAMUSCULAR | Status: DC | PRN
  Administered 2022-01-28: 2 mg via INTRAVENOUS
  Administered 2022-01-28: 1 mg via INTRAVENOUS

## 2022-01-28 MED ORDER — SODIUM CHLORIDE 0.9% FLUSH: 3.0000 mL | INTRAVENOUS | Status: DC | PRN

## 2022-01-28 MED ORDER — ONDANSETRON HCL 4 MG/2ML IJ SOLN: 4.0000 mg | Freq: Four times a day (QID) | INTRAMUSCULAR | Status: DC | PRN

## 2022-01-28 MED ORDER — HYDRALAZINE HCL 20 MG/ML IJ SOLN: 10.0000 mg | INTRAMUSCULAR | Status: DC | PRN

## 2022-01-28 MED ORDER — NITROGLYCERIN 1 MG/10 ML FOR IR/CATH LAB
INTRA_ARTERIAL | Status: DC | PRN
  Administered 2022-01-28: 500 ug via INTRA_ARTERIAL

## 2022-01-28 MED ORDER — LIDOCAINE HCL (PF) 1 % IJ SOLN
INTRAMUSCULAR | Status: DC | PRN
  Administered 2022-01-28: 2 mL

## 2022-01-28 MED ORDER — ACETAMINOPHEN 325 MG PO TABS: 650.0000 mg | ORAL_TABLET | ORAL | Status: DC | PRN

## 2022-01-28 MED ORDER — HEPARIN SODIUM (PORCINE) 1000 UNIT/ML IJ SOLN
INTRAMUSCULAR | Status: AC
  Filled 2022-01-28: qty 10

## 2022-01-28 MED ORDER — LIDOCAINE HCL (PF) 1 % IJ SOLN
INTRAMUSCULAR | Status: AC
  Filled 2022-01-28: qty 30

## 2022-01-28 MED ORDER — SODIUM CHLORIDE 0.9 % IV SOLN: INTRAVENOUS | Status: DC

## 2022-01-28 MED ORDER — NITROGLYCERIN 1 MG/10 ML FOR IR/CATH LAB
INTRA_ARTERIAL | Status: AC
  Filled 2022-01-28: qty 10

## 2022-01-28 MED ORDER — VERAPAMIL HCL 2.5 MG/ML IV SOLN
INTRAVENOUS | Status: AC
  Filled 2022-01-28: qty 2

## 2022-01-28 MED ORDER — LABETALOL HCL 5 MG/ML IV SOLN: 10.0000 mg | INTRAVENOUS | Status: DC | PRN

## 2022-01-28 MED ORDER — SODIUM CHLORIDE 0.9 % WEIGHT BASED INFUSION: 3.0000 mL/kg/h | INTRAVENOUS | Status: AC

## 2022-01-28 MED ORDER — IOHEXOL 350 MG/ML SOLN
INTRAVENOUS | Status: DC | PRN
  Administered 2022-01-28: 60 mL

## 2022-01-28 MED ORDER — VERAPAMIL HCL 2.5 MG/ML IV SOLN
INTRAVENOUS | Status: DC | PRN
  Administered 2022-01-28: 10 mL via INTRA_ARTERIAL

## 2022-01-28 MED ORDER — SODIUM CHLORIDE 0.9 % WEIGHT BASED INFUSION
1.0000 mL/kg/h | INTRAVENOUS | Status: DC
  Administered 2022-01-28: 1 mL/kg/h via INTRAVENOUS

## 2022-01-28 MED ORDER — CLOPIDOGREL BISULFATE 75 MG PO TABS: 75.0000 mg | ORAL_TABLET | ORAL | Status: DC

## 2022-01-28 MED ORDER — ASPIRIN 81 MG PO CHEW: 81.0000 mg | CHEWABLE_TABLET | ORAL | Status: DC

## 2022-01-28 MED ORDER — SODIUM CHLORIDE 0.9 % IV SOLN: 250.0000 mL | INTRAVENOUS | Status: DC | PRN

## 2022-01-28 MED ORDER — SODIUM CHLORIDE 0.9 % IV SOLN
250.0000 mL | INTRAVENOUS | Status: DC | PRN
Start: 2022-01-28 — End: 2022-01-29

## 2022-01-28 MED ORDER — PANTOPRAZOLE SODIUM 40 MG PO TBEC
40.0000 mg | DELAYED_RELEASE_TABLET | Freq: Every day | ORAL | 2 refills | Status: DC
  Filled 2022-01-28: qty 30, 30d supply, fill #0

## 2022-01-28 MED ORDER — HEPARIN (PORCINE) IN NACL 1000-0.9 UT/500ML-% IV SOLN
INTRAVENOUS | Status: DC | PRN
  Administered 2022-01-28 (×2): 500 mL

## 2022-01-28 MED ORDER — HEPARIN SODIUM (PORCINE) 1000 UNIT/ML IJ SOLN
INTRAMUSCULAR | Status: DC | PRN
  Administered 2022-01-28: 2000 [IU] via INTRAVENOUS
  Administered 2022-01-28: 9000 [IU] via INTRAVENOUS

## 2022-01-28 MED ORDER — FENTANYL CITRATE (PF) 100 MCG/2ML IJ SOLN
INTRAMUSCULAR | Status: DC | PRN
  Administered 2022-01-28 (×2): 25 ug via INTRAVENOUS

## 2022-01-28 MED ORDER — FENTANYL CITRATE (PF) 100 MCG/2ML IJ SOLN
INTRAMUSCULAR | Status: AC
Start: 2022-01-28 — End: ?
  Filled 2022-01-28: qty 2

## 2022-01-28 MED ORDER — NITROGLYCERIN 0.4 MG SL SUBL
0.4000 mg | SUBLINGUAL_TABLET | SUBLINGUAL | 2 refills | Status: DC | PRN
  Filled 2022-01-28: qty 25, 14d supply, fill #0

## 2022-01-28 MED ORDER — HEPARIN (PORCINE) IN NACL 1000-0.9 UT/500ML-% IV SOLN
INTRAVENOUS | Status: AC
  Filled 2022-01-28: qty 1000

## 2022-01-28 SURGICAL SUPPLY — 25 items
BALL SAPPHIRE NC24 3.5X12 (BALLOONS) ×2
BALL SAPPHIRE NC24 4.0X8 (BALLOONS) ×2
BALLN SAPPHIRE 2.5X12 (BALLOONS) ×2
BALLOON SAPPHIRE 2.5X12 (BALLOONS) IMPLANT
BALLOON SAPPHIRE NC24 3.5X12 (BALLOONS) IMPLANT
BALLOON SAPPHIRE NC24 4.0X8 (BALLOONS) IMPLANT
CATH LAUNCHER 6FR JR4 (CATHETERS) ×1 IMPLANT
CATH OPTICROSS HD (CATHETERS) ×1 IMPLANT
DEVICE RAD COMP TR BAND LRG (VASCULAR PRODUCTS) ×1 IMPLANT
GLIDESHEATH SLEND SS 6F .021 (SHEATH) ×1 IMPLANT
GUIDEWIRE INQWIRE 1.5J.035X260 (WIRE) IMPLANT
INQWIRE 1.5J .035X260CM (WIRE) ×2
KIT ENCORE 26 ADVANTAGE (KITS) ×1 IMPLANT
KIT HEART LEFT (KITS) ×2 IMPLANT
KIT HEMO VALVE WATCHDOG (MISCELLANEOUS) ×1 IMPLANT
PACK CARDIAC CATHETERIZATION (CUSTOM PROCEDURE TRAY) ×2 IMPLANT
SLED PULL BACK IVUS (MISCELLANEOUS) ×1 IMPLANT
STENT SYNERGY XD 3.0X20 (Permanent Stent) IMPLANT
STENT SYNERGY XD 4.0X12 (Permanent Stent) IMPLANT
SYNERGY XD 3.0X20 (Permanent Stent) ×2 IMPLANT
SYNERGY XD 4.0X12 (Permanent Stent) ×2 IMPLANT
TRANSDUCER W/STOPCOCK (MISCELLANEOUS) ×2 IMPLANT
TUBING CIL FLEX 10 FLL-RA (TUBING) ×2 IMPLANT
WIRE ASAHI FIELDER XT 190CM (WIRE) ×1 IMPLANT
WIRE ASAHI PROWATER 180CM (WIRE) ×1 IMPLANT

## 2022-01-28 NOTE — Progress Notes (Signed)
Discussed with pt and wife stents, restrictions, Plavix importance, diet, exercise, NTG and CRPII. Pt receptive. Will refer to [facility].  ?[id number] ?Yves Dill CES, ACSM ?3:54 PM ?01/28/2022 ? ?

## 2022-01-28 NOTE — Interval H&P Note (Signed)
Cath Lab Visit (complete for each Cath Lab visit) ? ?Clinical Evaluation Leading to the Procedure:  ? ?ACS: No. ? ?Non-ACS:   ? ?Anginal Classification: CCS III ? ?Anti-ischemic medical therapy: Minimal Therapy (1 class of medications) ? ?Non-Invasive Test Results: High-risk stress test findings: cardiac mortality >3%/year ? ?Prior CABG: No previous CABG ? ? ? ? ? ?History and Physical Interval Note: ? ?01/28/2022 ?1:16 PM ? ?Alex Mccarty  has presented today for surgery, with the diagnosis of cad.  The various methods of treatment have been discussed with the patient and family. After consideration of risks, benefits and other options for treatment, the patient has consented to  Procedure(s): ?CORONARY STENT INTERVENTION (N/A) as a surgical intervention.  The patient's history has been reviewed, patient examined, no change in status, stable for surgery.  I have reviewed the patient's chart and labs.  Questions were answered to the patient's satisfaction.   ? ? ?Larae Grooms ? ? ?

## 2022-01-28 NOTE — Discharge Summary (Addendum)
?Discharge Summary for Same Day PCI  ? ?Patient ID: Alex Mccarty ?MRN: [id number]; DOB: 10-May-1946 ? ?Admit date: 01/28/2022 ?Discharge date: 01/28/2022 ? ?Primary Care Provider: [facility]  ?Primary Cardiologist: Candee Furbish, MD  ?Primary Electrophysiologist:  None  ? ?Discharge Diagnoses  ?  ?Principal Problem: ?  CAD (coronary artery disease) ?Active Problems: ?  HTN (hypertension) ?  HLD (hyperlipidemia) ?  Type 2 diabetes mellitus with nephropathy ([place]) ?  Chronic kidney disease (CKD), stage IV (severe) ([facility]) ? ? ? ?Diagnostic Studies/Procedures  ?  ?Cardiac Catheterization 01/28/2022: ?  Ost RCA lesion is 70% stenosed.  A drug-eluting stent was successfully placed using a SYNERGY XD 4.0X12, postdilated to 22 Atm and optmized with IVUS. THere were a few struts hanging into the aorta. ?  Post intervention, there is a 0% residual stenosis. ?  Mid RCA lesion is 90% stenosed in a very tortuous segment.  A drug-eluting stent was successfully placed using a SYNERGY XD 3.0X20, postdilated to 3.5 mm and optimized with IVUS. ?  Post intervention, there is a 0% residual stenosis. ?  LV end diastolic pressure is normal. ?  ?Continue DAPT for 6 months.  Contrast use minimized.  Aggressive hydration post procedure. ?  ?Plan for same day discharge. ? ?Diagnostic ?Dominance: Right ?Intervention ? ? ?_____________ ?  ?History of Present Illness   ?  ?Alex Mccarty is a 76 y.o. male with a history of CAD, dyspnea on exertion, hypertension, hyperlipidemia, type 2 diabetes mellitus, multiple TIAs, CKD stage IV. Patient was recently referred to Dr. Gillian Shields on 01/18/2022 for further evaluation of chest pain. At this visit, patient reported dyspnea with minimal activity over the past month with some associated chest discomfort as well. Prior nuclear stress test within the last few months was abnormal. He was also currently wearing a heart monitor. Echo was ordered and outpatient cardiac catheterization was ordered for  further evaluation. LHC on 01/21/2022 showed 40% stenosis of ostial RCA followed by 90% stenosis of mid RCA and 25% stenosis of proximal LAD. 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 Given his CKD stage IV, case was discussed with Dr. Marlou Porch and CT surgeon and decision was made to bring him back for PCI of RCA. This was arranged for 01/28/2022. ? ?Hospital Course  ?   ?Patient presented to [facility] on 01/28/2022 for planned PCI of RCA as stated above. He underwent successful PCI with DES to the ostial RCA lesion and DES to the mid RCA lesion. He received 6 hours of IV fluids post-cath given his CKD stage IV. Plan is for DAPT with ASA and Plavix for at least 6 months (already on this at home). The patient was seen by cardiac rehab while in short stay. There were no observed complications post cath. Right radial cath site was re-evaluated prior to discharge and found to be stable without any complications. Instructions/precautions regarding cath site care were given prior to discharge. ? ?Alex Mccarty was seen by Dr. Irish Lack and determined stable for discharge home. Follow up with our office has been arranged. Consider checking BMET at follow-up visit. Medications are listed below. Pertinent changes include switching from Omeprazole to Protonix given patient is on Plavix. ? ?_____________ ? ?Cath/PCI Registry Performance & Quality Measures: ?Aspirin prescribed? - Yes ?ADP Receptor Inhibitor (Plavix/Clopidogrel, Brilinta/Ticagrelor or Effient/Prasugrel) prescribed (includes medically managed patients)? - Yes ?High Intensity Statin (Lipitor 40-59m or Crestor 20-422m prescribed? -  Yes ?For EF <40%, was ACEI/ARB prescribed? - Not Applicable (EF >/= 99%) ?For EF <40%, Aldosterone Antagonist (Spironolactone or Eplerenone) prescribed? - Not Applicable (EF >/= 37%) ?Cardiac Rehab Phase II ordered (Included Medically managed Patients)? -  Yes ? ?_____________ ? ? ?Discharge Vitals ?Blood pressure (!) 163/70, pulse (!) 59, temperature 98.5 ?F (36.9 ?C), temperature source Oral, resp. rate 16, height '5\' 9"'  (1.753 m), weight 84.4 kg, SpO2 97 %.  Danley Danker Weights  ? 01/28/22 0719  ?Weight: 84.4 kg  ? ? ?Last Labs & Radiologic Studies  ?  ?CBC ?Recent Labs  ?  01/27/22 ?1042  ?WBC 5.4  ?HGB 13.3  ?HCT 40.9  ?MCV 93  ?PLT 113*  ? ?Basic Metabolic Panel ?Recent Labs  ?  01/27/22 ?1042  ?NA 139  ?K 5.3*  ?CL 107*  ?CO2 26  ?GLUCOSE 232*  ?BUN 27  ?CREATININE 2.56*  ?CALCIUM 9.1  ? ?Liver Function Tests ?No results for input(s): AST, ALT, ALKPHOS, BILITOT, PROT, ALBUMIN in the last 72 hours. ?No results for input(s): LIPASE, AMYLASE in the last 72 hours. ?High Sensitivity Troponin:   ?No results for input(s): TROPONINIHS in the last 720 hours.  ?BNP ?Invalid input(s): POCBNP ?D-Dimer ?No results for input(s): DDIMER in the last 72 hours. ?Hemoglobin A1C ?No results for input(s): HGBA1C in the last 72 hours. ?Fasting Lipid Panel ?No results for input(s): CHOL, HDL, LDLCALC, TRIG, CHOLHDL, LDLDIRECT in the last 72 hours. ?Thyroid Function Tests ?No results for input(s): TSH, T4TOTAL, T3FREE, THYROIDAB in the last 72 hours. ? ?Invalid input(s): FREET3 ?_____________  ?CARDIAC CATHETERIZATION ? ?Result Date: 01/28/2022 ?  Ost RCA lesion is 70% stenosed.  A drug-eluting stent was successfully placed using a SYNERGY XD 4.0X12, postdilated to 22 Atm and optmized with IVUS. THere were a few struts hanging into the aorta.   Post intervention, there is a 0% residual stenosis.   Mid RCA lesion is 90% stenosed in a very tortuous segment.  A drug-eluting stent was successfully placed using a SYNERGY XD 3.0X20, postdilated to 3.5 mm and optimized with IVUS.   Post intervention, there is a 0% residual stenosis.   LV end diastolic pressure is normal. Continue DAPT for 6 months.  Contrast use minimized.  Aggressive hydration post procedure. Plan for same day discharge/   ? ?CARDIAC CATHETERIZATION ? ?Result Date: 01/21/2022 ?  Mid RCA lesion is 90% stenosed.   Ost RCA lesion is 40% stenosed.   Prox LAD lesion is 25% stenosed.   LV end diastolic pressure is normal.   There is no aortic valve stenosis.   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 Will review with primary cardiologist and likely CT surgery regarding aneurysm.  If no plans for surgery, will bring him back to RCA PCI.  Renal insufficiency as well, so beneficial to defer PCI today to minimize contrast exposure.   ? ?Disposition  ? ?Pt is being discharged home today in good condition. ? ?Follow-up Plans & Appointments  ? ? ? Follow-up Information   ? ? Isaiah Serge, NP Follow up.   ?Specialties: Cardiology, Radiology ?Why: Hospital follow-up with Cardiology scheduled for 02/04/2022 at 2:45am. Please arrive 15 minutes early for check-in. If this date/time does not work for you, please call our office to reschedule. ?Contact information: ?[address] ?[address] ?[place] [address] ?[phone] ? ? ?  ?  ? ?  ?  ? ?  ? ?Discharge Instructions   ? ?  AMB Referral to Cardiac Rehabilitation - Phase II   Complete by: As directed ?  ? Diagnosis: Coronary Stents  ? After initial evaluation and assessments completed: Virtual Based Care may be provided alone or in conjunction with Phase 2 Cardiac Rehab based on patient barriers.: Yes  ? ?  ? ? ? ?Discharge Medications  ? ?Allergies as of 01/28/2022   ? ?   Reactions  ? Ticagrelor Shortness Of Breath  ? Codeine Nausea And Vomiting  ? Hydrocodone Nausea And Vomiting  ? Hydromorphone   ? Other reaction(s): Confusion (intolerance) ?Hallucinations  ? Penicillins Other (See Comments)  ? Childhood reaction ?Has patient had a PCN reaction causing immediate rash, facial/tongue/throat swelling, SOB or lightheadedness with hypotension: Unknown ?Has patient had a PCN reaction causing severe rash involving mucus membranes or skin necrosis:  Unknown ?Has patient had a PCN reaction that required hospitalization: Unknown ?Has patient had a PCN reaction occurring within the last 10 years: Unknown ?If all of the above answers are "NO", then may proceed with Cephalosporin use

## 2022-01-31 ENCOUNTER — Encounter (HOSPITAL_COMMUNITY): Payer: Self-pay | Admitting: Interventional Cardiology

## 2022-02-01 ENCOUNTER — Ambulatory Visit (INDEPENDENT_AMBULATORY_CARE_PROVIDER_SITE_OTHER): Payer: HMO | Admitting: Ophthalmology

## 2022-02-01 ENCOUNTER — Encounter (INDEPENDENT_AMBULATORY_CARE_PROVIDER_SITE_OTHER): Payer: Self-pay | Admitting: Ophthalmology

## 2022-02-01 DIAGNOSIS — H43823 Vitreomacular adhesion, bilateral: Secondary | ICD-10-CM

## 2022-02-01 DIAGNOSIS — E113412 Type 2 diabetes mellitus with severe nonproliferative diabetic retinopathy with macular edema, left eye: Secondary | ICD-10-CM | POA: Diagnosis not present

## 2022-02-01 DIAGNOSIS — E113411 Type 2 diabetes mellitus with severe nonproliferative diabetic retinopathy with macular edema, right eye: Secondary | ICD-10-CM

## 2022-02-01 DIAGNOSIS — H35341 Macular cyst, hole, or pseudohole, right eye: Secondary | ICD-10-CM | POA: Insufficient documentation

## 2022-02-01 DIAGNOSIS — G4730 Sleep apnea, unspecified: Secondary | ICD-10-CM

## 2022-02-01 NOTE — Assessment & Plan Note (Signed)
OS physiologic ? 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  This early macular hole deserves observation since VMA could release spontaneously and the condition resolved. ? ?Patient to monitor at home ?

## 2022-02-01 NOTE — Assessment & Plan Note (Signed)
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 ? ?Recent systemic stress of ongoing cardiac ischemia corrected with catheterization as well as stent placement, may likely have decrease systemic stress on the retinal vasculature as well. ?

## 2022-02-01 NOTE — Assessment & Plan Note (Signed)
Will observe hoping that the VMA will release spontaneously and the hole closed.  Is an outer retinal hole not full-thickness ?

## 2022-02-01 NOTE — Progress Notes (Signed)
? ? ?02/01/2022 ? ?  ? ?CHIEF COMPLAINT ?Patient presents for  ?Chief Complaint  ?Patient presents with  ? Diabetic Retinopathy with Macular Edema  ? ? ? ? ?HISTORY OF PRESENT ILLNESS: ?Alex Mccarty is a 76 y.o. male who presents to the clinic today for:  ? ?HPI   ?5 mos dilate OU, FP, OCT. ?Patient reports "I think it is a little better. I can go on the porch and read a book without my glasses."  Yet patient does have a small relative scotoma missing 1 letter on a word when he reads only with the right eye. ? ? ?Patient reports "I had two stents put in last Friday, heart catheterizations." ?LBS: 130 checked yesterday  ?A1C: unsure ?Last edited by Hurman Horn, MD on 02/01/2022  2:31 PM.  ?  ? ? ?Referring physician: ?Elisabeth Cara, PA-C ?[address] ?[address] ?[place],  [place] [address] ? ?HISTORICAL INFORMATION:  ? ?Selected notes from the [facility] ?  ? ?Lab Results  ?Component Value Date  ? HGBA1C 6.6 (H) 05/01/2021  ?  ? ?CURRENT MEDICATIONS: ?No current outpatient medications on file. (Ophthalmic Drugs)  ? ?No current facility-administered medications for this visit. (Ophthalmic Drugs)  ? ?Current Outpatient Medications (Other)  ?Medication Sig  ? amLODipine (NORVASC) 2.5 MG tablet Take 2.5 mg by mouth daily.  ? aspirin 81 MG EC tablet Take 81 mg by mouth at bedtime.  ? atorvastatin (LIPITOR) 80 MG tablet Take 80 mg by mouth at bedtime.   ? Cholecalciferol (VITAMIN D3) 50 MCG (2000 UT) TABS Take 2,000 Units by mouth daily.  ? clopidogrel (PLAVIX) 75 MG tablet Take 75 mg by mouth daily.  ? cyclobenzaprine (FLEXERIL) 10 MG tablet Take 10 mg by mouth as needed (muscle pain).   ? empagliflozin (JARDIANCE) 25 MG TABS tablet Take 12.5 mg by mouth daily.  ? gabapentin (NEURONTIN) 300 MG capsule Take 300 mg by mouth at bedtime.  ? Glucosamine-Chondroitin (COSAMIN DS PO) Take 1 capsule by mouth at bedtime.  ? LANTUS SOLOSTAR 100 UNIT/ML Solostar Pen Inject 46 Units into the skin every morning.  ?  losartan (COZAAR) 100 MG tablet Take 100 mg by mouth at bedtime.  ? nitroGLYCERIN (NITROSTAT) 0.4 MG SL tablet Place 1 tablet (0.4 mg total) under the tongue every 5 (five) minutes as needed for chest pain.  ? pantoprazole (PROTONIX) 40 MG tablet Take 1 tablet (40 mg total) by mouth daily.  ? primidone (MYSOLINE) 50 MG tablet Take 25 mg by mouth at bedtime.  ? propranolol (INDERAL) 20 MG tablet Take 20-40 mg by mouth every morning. Take 40 mg in the morning and 20 mg at lunch time  ? tiZANidine (ZANAFLEX) 4 MG tablet Take 4 mg by mouth every 6 (six) hours as needed for muscle spasms.  ? zinc gluconate 50 MG tablet Take 50 mg by mouth in the morning.  ? ?No current facility-administered medications for this visit. (Other)  ? ? ? ? ?REVIEW OF SYSTEMS: ?ROS   ?Negative for: Constitutional, Gastrointestinal, Neurological, Skin, Genitourinary, Musculoskeletal, HENT, Endocrine, Cardiovascular, Eyes, Respiratory, Psychiatric, Allergic/Imm, Heme/Lymph ?Last edited by Hurman Horn, MD on 02/01/2022  2:24 PM.  ?  ? ? ? ?ALLERGIES ?Allergies  ?Allergen Reactions  ? Ticagrelor Shortness Of Breath  ? Codeine Nausea And Vomiting  ? Hydrocodone Nausea And Vomiting  ? Hydromorphone   ?  Other reaction(s): Confusion (intolerance) ?Hallucinations  ? Penicillins Other (See Comments)  ?  Childhood reaction ?  Has patient had a PCN reaction causing immediate rash, facial/tongue/throat swelling, SOB or lightheadedness with hypotension: Unknown ?Has patient had a PCN reaction causing severe rash involving mucus membranes or skin necrosis: Unknown ?Has patient had a PCN reaction that required hospitalization: Unknown ?Has patient had a PCN reaction occurring within the last 10 years: Unknown ?If all of the above answers are "NO", then may proceed with Cephalosporin use. ? ?  ? Sulfa Antibiotics Nausea And Vomiting  ? ? ?PAST MEDICAL HISTORY ?Past Medical History:  ?Diagnosis Date  ? Arthritis   ? Bowel obstruction ([place])   ? CKD (chronic  kidney disease)   ? unkknown stage per patient  ? Diabetes mellitus ([place])   ? Hyperlipidemia   ? Hypertension   ? Kidney stones   ? ?Past Surgical History:  ?Procedure Laterality Date  ? COLONOSCOPY  10/2015  ? Dr Kenton Kingfisher  ? COLOSTOMY REVISION Right 09/29/2017  ? Procedure: COLON RESECTION RIGHT;  Surgeon: Jovita Kussmaul, MD;  Location: [facility];  Service: General;  Laterality: Right;  ? CORONARY STENT INTERVENTION N/A 01/28/2022  ? Procedure: CORONARY STENT INTERVENTION;  Surgeon: Jettie Booze, MD;  Location: [facility];  Service: Cardiovascular;  Laterality: N/A;  ? ELBOW SURGERY    ? ulnar nerve repair  ? ESOPHAGOGASTRODUODENOSCOPY    ? same time   ? FOOT SURGERY    ? crushed heel  ? INTRAVASCULAR ULTRASOUND/IVUS N/A 01/28/2022  ? Procedure: Intravascular Ultrasound/IVUS;  Surgeon: Jettie Booze, MD;  Location: [facility];  Service: Cardiovascular;  Laterality: N/A;  ? LAPAROTOMY N/A 09/29/2017  ? Procedure: EXPLORATORY LAPAROTOMY;  Surgeon: Jovita Kussmaul, MD;  Location: [facility];  Service: General;  Laterality: N/A;  ? LEFT HEART CATH N/A 01/28/2022  ? Procedure: Left Heart Cath;  Surgeon: Jettie Booze, MD;  Location: [facility];  Service: Cardiovascular;  Laterality: N/A;  ? LEFT HEART CATH AND CORONARY ANGIOGRAPHY N/A 01/21/2022  ? Procedure: LEFT HEART CATH AND CORONARY ANGIOGRAPHY;  Surgeon: Jettie Booze, MD;  Location: [facility];  Service: Cardiovascular;  Laterality: N/A;  ? ? ?FAMILY HISTORY ?Family History  ?Problem Relation Age of Onset  ? Kidney Stones Mother   ? Heart disease Mother   ? Colon cancer Neg Hx   ? Esophageal cancer Neg Hx   ? ? ?SOCIAL HISTORY ?Social History  ? ?Tobacco Use  ? Smoking status: Former  ? Smokeless tobacco: Never  ? Tobacco comments:  ?  quit over 40 years ago.Around age [age]   ?Vaping Use  ? Vaping Use: Never used  ?Substance Use Topics  ? Alcohol use: Yes  ?  Alcohol/week: 0.0 standard drinks  ?  Comment:  occasional  ? Drug use: No  ? ?  ? ?  ? ?OPHTHALMIC EXAM: ? ?Base Eye Exam   ? ? Visual Acuity (ETDRS)   ? ?   Right Left  ? Dist cc 20/25 -1 20/20  ? ? Correction: Glasses  ? ?  ?  ? ? Tonometry (Tonopen, 1:38 PM)   ? ?   Right Left  ? Pressure 8 8  ? ?  ?  ? ? Pupils   ? ?   Pupils Dark Light APD  ? Right PERRL 4 3 None  ? Left PERRL 4 3 None  ? ?  ?  ? ? Extraocular Movement   ? ?   Right Left  ?  Full Full  ? ?  ?  ? ?  Neuro/Psych   ? ? Oriented x3: Yes  ? Mood/Affect: Normal  ? ?  ?  ? ? Dilation   ? ? Both eyes: 1.0% Mydriacyl, 2.5% Phenylephrine @ 1:38 PM  ? ?  ?  ? ?  ? ?Slit Lamp and Fundus Exam   ? ? External Exam   ? ?   Right Left  ? External Normal Normal  ? ?  ?  ? ? Slit Lamp Exam   ? ?   Right Left  ? Lids/Lashes Normal Normal  ? Conjunctiva/Sclera White and quiet White and quiet  ? Cornea Clear Clear  ? Anterior Chamber Deep and quiet Deep and quiet  ? Iris Round and reactive Round and reactive  ? Lens Centered posterior chamber intraocular lens Centered posterior chamber intraocular lens  ? Anterior Vitreous Normal Normal  ? ?  ?  ? ? Fundus Exam   ? ?   Right Left  ? Posterior Vitreous Normal,  Normal  ? Disc Normal Normal  ? C/D Ratio 0.3 0.2  ? Macula Microaneurysms temporally , Mild clinically significant macular edema Microaneurysms, Mild clinically significant macular edema. temporally  ? Vessels NPDR-Severe NPDR-Severe  ? Periphery Normal Normal  ? ?  ?  ? ?  ? ? ?IMAGING AND PROCEDURES  ?Imaging and Procedures for 02/01/22 ? ?OCT, Retina - OU - Both Eyes   ? ?   ?Right Eye ?Quality was good. Scan locations included subfoveal. Central Foveal Thickness: 266. Progression has improved. Findings include vitreomacular adhesion .  ? ?Left Eye ?Quality was good. Scan locations included subfoveal. Central Foveal Thickness: 278. Progression has worsened. Findings include vitreomacular adhesion .  ? ?Notes ?OD with current VMT.  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 ? ?OD, stable currently at 7 months postinjection.  Watch less CSME ? ?OS, much improved CSME, small area inferotemporal will observe ? ? ?

## 2022-02-01 NOTE — Assessment & Plan Note (Signed)
Patient did have testing performed does not have sleep apnea ?

## 2022-02-01 NOTE — Assessment & Plan Note (Signed)
Recent systemic stress of ongoing cardiac ischemia corrected with catheterization as well as stent placement, may likely have decrease systemic stress on the retinal vasculature as well. ?

## 2022-02-04 ENCOUNTER — Encounter: Payer: Self-pay | Admitting: Cardiology

## 2022-02-04 ENCOUNTER — Ambulatory Visit (INDEPENDENT_AMBULATORY_CARE_PROVIDER_SITE_OTHER): Payer: No Typology Code available for payment source | Admitting: Cardiology

## 2022-02-04 VITALS — BP 122/50 | HR 67 | Ht 69.0 in | Wt 188.0 lb

## 2022-02-04 DIAGNOSIS — I2583 Coronary atherosclerosis due to lipid rich plaque: Secondary | ICD-10-CM | POA: Diagnosis not present

## 2022-02-04 DIAGNOSIS — I10 Essential (primary) hypertension: Secondary | ICD-10-CM

## 2022-02-04 DIAGNOSIS — E1121 Type 2 diabetes mellitus with diabetic nephropathy: Secondary | ICD-10-CM | POA: Diagnosis not present

## 2022-02-04 DIAGNOSIS — Z95820 Peripheral vascular angioplasty status with implants and grafts: Secondary | ICD-10-CM | POA: Diagnosis not present

## 2022-02-04 DIAGNOSIS — I2510 Atherosclerotic heart disease of native coronary artery without angina pectoris: Secondary | ICD-10-CM | POA: Diagnosis not present

## 2022-02-04 DIAGNOSIS — N184 Chronic kidney disease, stage 4 (severe): Secondary | ICD-10-CM

## 2022-02-04 DIAGNOSIS — E782 Mixed hyperlipidemia: Secondary | ICD-10-CM

## 2022-02-04 DIAGNOSIS — I2541 Coronary artery aneurysm: Secondary | ICD-10-CM

## 2022-02-04 MED ORDER — PANTOPRAZOLE SODIUM 40 MG PO TBEC: 40.0000 mg | DELAYED_RELEASE_TABLET | Freq: Every day | ORAL | 2 refills | Status: DC

## 2022-02-04 MED ORDER — NITROGLYCERIN 0.4 MG SL SUBL: 0.4000 mg | SUBLINGUAL_TABLET | SUBLINGUAL | 2 refills | Status: DC | PRN

## 2022-02-04 NOTE — Progress Notes (Signed)
Cardiology Office Note   Date:  02/06/2022   ID:  DEVESH Mccarty, DOB 10-Nov-1945, MRN [id number]  PCP:  [facility]  Cardiologist:  Dr. Anne Fu    Chief Complaint  Patient presents with   Hospitalization Follow-up    S/p stent      History of Present Illness: Alex Mccarty is a 76 y.o. male who presents for post hospital for stent.   He has a history of CAD, dyspnea on exertion, hypertension, hyperlipidemia, type 2 diabetes mellitus, multiple TIAs, CKD stage IV. Patient was recently referred to Dr. Anne Fu on 01/18/2022 for further evaluation of chest pain. At this visit, patient reported dyspnea with minimal activity over the past month with some associated chest discomfort as well. Prior nuclear stress test within the last few months was abnormal. He was also currently wearing a heart monitor. Echo was ordered and outpatient cardiac catheterization was ordered for further evaluation. LHC on 01/21/2022 showed 40% stenosis of ostial RCA followed by 90% stenosis of mid RCA and 25% stenosis of proximal LAD. 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 Given his CKD stage IV, case was discussed with Dr. Anne Fu and CT surgeon and decision was made to bring him back for PCI of RCA. This was arranged for 01/28/2022.  Presented and underwent PCI to RCA lesion  X 2, that was successful.  Plan for DAPT for at least 6 months.  He was seen by cardiac rehab.    Today he has had no chest pain and no SOB, he feels well. Would like to go to cardiac rehab.  No bleeding from Plavix.   He and his wife most concerned about LM aneurysm.  We discussed with location and risk stent not an option.  Surgery would be difficult as well.  BP control is important.  He is taking medications approp.  Protonix sent to [place] in [place].  He does have NTG sl to take as well.     Prior to cath jardiance held and losartan prior to procedure.  He  is followed at [facility] for CKD 4.  Will check BMP today post cath.   Past Surgical History:  Procedure Laterality Date   COLONOSCOPY  10/2015   Dr Glenice Bow   COLOSTOMY REVISION Right 09/29/2017   Procedure: COLON RESECTION RIGHT;  Surgeon: Griselda Miner, MD;  Location: [facility];  Service: General;  Laterality: Right;   CORONARY STENT INTERVENTION N/A 01/28/2022   Procedure: CORONARY STENT INTERVENTION;  Surgeon: Corky Crafts, MD;  Location: [facility] INVASIVE CV LAB;  Service: Cardiovascular;  Laterality: N/A;   ELBOW SURGERY     ulnar nerve repair   ESOPHAGOGASTRODUODENOSCOPY     same time    FOOT SURGERY     crushed heel   INTRAVASCULAR ULTRASOUND/IVUS N/A 01/28/2022   Procedure: Intravascular Ultrasound/IVUS;  Surgeon: Corky Crafts, MD;  Location: [facility] INVASIVE CV LAB;  Service: Cardiovascular;  Laterality: N/A;   LAPAROTOMY N/A 09/29/2017   Procedure: EXPLORATORY LAPAROTOMY;  Surgeon: Griselda Miner, MD;  Location: [facility];  Service: General;  Laterality: N/A;   LEFT HEART CATH N/A 01/28/2022   Procedure: Left Heart Cath;  Surgeon: Corky Crafts, MD;  Location: [facility] INVASIVE CV LAB;  Service: Cardiovascular;  Laterality: N/A;   LEFT HEART CATH AND CORONARY ANGIOGRAPHY N/A 01/21/2022   Procedure: LEFT HEART CATH AND CORONARY ANGIOGRAPHY;  Surgeon: Corky Crafts, MD;  Location:  [place] INVASIVE CV LAB;  Service: Cardiovascular;  Laterality: N/A;     Current Outpatient Medications  Medication Sig Dispense Refill   amLODipine (NORVASC) 2.5 MG tablet Take 2.5 mg by mouth daily.     aspirin 81 MG EC tablet Take 81 mg by mouth at bedtime.     atorvastatin (LIPITOR) 80 MG tablet Take 80 mg by mouth at bedtime.      Cholecalciferol (VITAMIN D3) 50 MCG (2000 UT) TABS Take 2,000 Units by mouth daily.     clopidogrel (PLAVIX) 75 MG tablet Take 75 mg by mouth daily.     cyclobenzaprine (FLEXERIL) 10 MG tablet Take 10 mg by mouth as needed (muscle pain).      empagliflozin (JARDIANCE) 25  MG TABS tablet Take 12.5 mg by mouth daily.     gabapentin (NEURONTIN) 300 MG capsule Take 300 mg by mouth at bedtime.     Glucosamine-Chondroitin (COSAMIN DS PO) Take 1 capsule by mouth at bedtime.     LANTUS SOLOSTAR 100 UNIT/ML Solostar Pen Inject 46 Units into the skin every morning.     losartan (COZAAR) 100 MG tablet Take 100 mg by mouth at bedtime.     primidone (MYSOLINE) 50 MG tablet Take 50 mg by mouth at bedtime.     propranolol (INDERAL) 20 MG tablet Take 20-40 mg by mouth every morning. Take 40 mg in the morning and 20 mg at lunch time     tiZANidine (ZANAFLEX) 4 MG tablet Take 4 mg by mouth every 6 (six) hours as needed for muscle spasms.     zinc gluconate 50 MG tablet Take 50 mg by mouth in the morning.     nitroGLYCERIN (NITROSTAT) 0.4 MG SL tablet Place 1 tablet (0.4 mg total) under the tongue every 5 (five) minutes as needed for chest pain. 25 tablet 2   pantoprazole (PROTONIX) 40 MG tablet Take 1 tablet (40 mg total) by mouth daily. 30 tablet 2   No current facility-administered medications for this visit.    Allergies:   Ticagrelor, Codeine, Hydrocodone, Hydromorphone, Penicillins, and Sulfa antibiotics    Social History:  The patient  reports that he has quit smoking. He has never used smokeless tobacco. He reports current alcohol use. He reports that he does not use drugs.   Family History:  The patient's family history includes Heart disease in his mother; Kidney Stones in his mother.    ROS:  General:no colds or fevers, no weight changes Skin:no rashes or ulcers HEENT:no blurred vision, no congestion CV:see HPI PUL:see HPI GI:no diarrhea constipation or melena, no indigestion GU:no hematuria, no dysuria MS:no joint pain, no claudication Neuro:no syncope, no lightheadedness Endo:+ diabetes, no thyroid disease  Wt Readings from Last 3 Encounters:  02/04/22 188 lb (85.3 kg)  01/28/22 186 lb (84.4 kg)  01/21/22 186 lb (84.4 kg)     PHYSICAL EXAM: VS:  BP  (!) 122/50   Pulse 67   Ht $R'5\' 9"'oO$  (1.753 m)   Wt 188 lb (85.3 kg)   SpO2 96%   BMI 27.76 kg/m  , BMI Body mass index is 27.76 kg/m. General:Pleasant affect, NAD Skin:Warm and dry, brisk capillary refill HEENT:normocephalic, sclera clear, mucus membranes moist Neck:supple, no JVD, no bruits  Heart:S1S2 RRR without murmur, gallup, rub or click, cath site stable, some tenderness but improving. Rt wrist.  Lungs:clear without rales, rhonchi, or wheezes TDV:VOHY, non tender, + BS, do not palpate liver spleen or masses Ext:no lower ext edema, 2+ pedal pulses,  2+ radial pulses Neuro:alert and oriented X3, MAE, follows commands, + facial symmetry    EKG:  EKG is ordered today. The ekg ordered today demonstrates SR borderline LVH, ant infarct and flipped T waves in III and AVF. No change from 01/28/22   Recent Labs: 09/03/2021: ALT 35 01/27/2022: Hemoglobin 13.3; Platelets 113 02/04/2022: BUN 37; Creatinine, Ser 2.71; Potassium 4.7; Sodium 140    Lipid Panel    Component Value Date/Time   CHOL 97 05/02/2021 0341   TRIG 84 05/02/2021 0341   HDL 25 (L) 05/02/2021 0341   CHOLHDL 3.9 05/02/2021 0341   VLDL 17 05/02/2021 0341   LDLCALC 55 05/02/2021 0341       Other studies Reviewed: Additional studies/ records that were reviewed today include: . Cardiac Catheterization 01/28/2022:   Ost RCA lesion is 70% stenosed.  A drug-eluting stent was successfully placed using a SYNERGY XD 4.0X12, postdilated to 22 Atm and optmized with IVUS. THere were a few struts hanging into the aorta.   Post intervention, there is a 0% residual stenosis.   Mid RCA lesion is 90% stenosed in a very tortuous segment.  A drug-eluting stent was successfully placed using a SYNERGY XD 3.0X20, postdilated to 3.5 mm and optimized with IVUS.   Post intervention, there is a 0% residual stenosis.   LV end diastolic pressure is normal.   Continue DAPT for 6 months.  Contrast use minimized.  Aggressive hydration post  procedure.   Plan for same day discharge.   Diagnostic Dominance: Right Intervention   _____________  ASSESSMENT AND PLAN:  1.  CAD with high grade stenosis of RCA undergoing PCI with stents to ostial RCA and stent to mRCA lesion.  Has done well. Continue  DAPT for 6 months at least.  Though he was on both before his CAD dx. Due to TIAs.  Ok for cardiac rehab.  On BB. Echo scheduled for 02/17/22 he will follow up with Dr. Liz Beach has worn a monitor from [place] no results yet.  2.  Lt main aneurysm - Dr. Irish Lack discussed with [facility]CTS.  Reviewed cath with pt and wife. Will discuss with Dr. Marlou Porch and Dr. Irish Lack. BP control is important  prior office visits reviewed --addendum 02/08/22 I discussed with Dr. Irish Lack and controlling BP and continuing plavix to prevent any thrombus is area.  Low probability or rupture but if chest pain he should be evaluated.   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  They will discuss with Dr. Marlou Porch with next appt.   3.  CKD -4 followed at [facility] by Elizbeth Squires, MD will recheck BMP today.  Will send results to nephrology. Cr usually runs 2.2.  he was hydrated prior to caths and ARB held. Along with Jardiance per nephrology notes.      4.  HLD on statin lipitor 80 last LDL 67 12/30/21 continue   4.  HTN elevated in hospital, controlled today.  Current medicines are reviewed with the patient today.  The patient Has no concerns regarding medicines.  5.  DM-2 Insulin dependent. Per PCP. Last A1C was 7.1.    6.  Hx of TIAs on DAPT.  The following changes have been made:  See above Labs/ tests ordered today include:see above  Disposition:   FU:  see above  Signed, Cecilie Kicks, NP  02/06/2022 10:26 PM    [place], [place]  [place], [place] Phone: [phone]; Fax: [phone]

## 2022-02-04 NOTE — Patient Instructions (Signed)
Medication Instructions:  Your physician recommends that you continue on your current medications as directed. Please refer to the Current Medication list given to you today. *If you need a refill on your cardiac medications before your next appointment, please call your pharmacy*   Lab Work: TODAY-BMET If you have labs (blood work) drawn today and your tests are completely normal, you will receive your results only by: [place] (if you have MyChart) OR A paper copy in the mail If you have any lab test that is abnormal or we need to change your treatment, we will call you to review the results.   Testing/Procedures: NONE ORDERED   Follow-Up: At [facility], you and your health needs are our priority.  As part of our continuing mission to provide you with exceptional heart care, we have created designated Provider Care Teams.  These Care Teams include your primary Cardiologist (physician) and Advanced Practice Providers (APPs -  Physician Assistants and Nurse Practitioners) who all work together to provide you with the care you need, when you need it.  We recommend signing up for the patient portal called "MyChart".  Sign up information is provided on this After Visit Summary.  MyChart is used to connect with patients for Virtual Visits (Telemedicine).  Patients are able to view lab/test results, encounter notes, upcoming appointments, etc.  Non-urgent messages can be sent to your provider as well.   To learn more about what you can do with MyChart, go to [email or web address].    Your next appointment:   4-6 week(s)  The format for your next appointment:   In Person  Provider:   Candee Furbish, MD ONLY    Other Instructions   Important Information About Sugar

## 2022-02-05 LAB — BASIC METABOLIC PANEL
BUN/Creatinine Ratio: 14 (ref 10–24)
BUN: 37 mg/dL — ABNORMAL HIGH (ref 8–27)
CO2: 21 mmol/L (ref 20–29)
Calcium: 9.4 mg/dL (ref 8.6–10.2)
Chloride: 107 mmol/L — ABNORMAL HIGH (ref 96–106)
Creatinine, Ser: 2.71 mg/dL — ABNORMAL HIGH (ref 0.76–1.27)
Glucose: 204 mg/dL — ABNORMAL HIGH (ref 70–99)
Potassium: 4.7 mmol/L (ref 3.5–5.2)
Sodium: 140 mmol/L (ref 134–144)
eGFR: 24 mL/min/{1.73_m2} — ABNORMAL LOW (ref >59)

## 2022-02-06 ENCOUNTER — Encounter: Payer: Self-pay | Admitting: Cardiology

## 2022-02-07 ENCOUNTER — Other Ambulatory Visit: Payer: Self-pay | Admitting: *Deleted

## 2022-02-08 ENCOUNTER — Telehealth: Payer: Self-pay | Admitting: *Deleted

## 2022-02-08 ENCOUNTER — Encounter: Payer: Self-pay | Admitting: Cardiology

## 2022-02-08 NOTE — Telephone Encounter (Signed)
Spoke with pt and Jenny Reichmann (wife) - pt needs referral from [place] for cardiac rehab.  Verbal consent obtained to fax needed records to [facility] [phone].  F/u office visit note by Cecilie Kicks stating pt's recent procedure and needing cardiac rehab faxed to # listed.

## 2022-02-17 ENCOUNTER — Ambulatory Visit (HOSPITAL_COMMUNITY): Payer: No Typology Code available for payment source | Attending: Cardiology

## 2022-02-17 DIAGNOSIS — R0609 Other forms of dyspnea: Secondary | ICD-10-CM | POA: Diagnosis not present

## 2022-02-17 DIAGNOSIS — I209 Angina pectoris, unspecified: Secondary | ICD-10-CM | POA: Insufficient documentation

## 2022-02-17 LAB — ECHOCARDIOGRAM COMPLETE
Area-P 1/2: 2.57 cm2
S' Lateral: 2.1 cm

## 2022-02-18 ENCOUNTER — Encounter: Payer: Self-pay | Admitting: Cardiology

## 2022-03-01 ENCOUNTER — Telehealth (HOSPITAL_COMMUNITY): Payer: Self-pay

## 2022-03-01 NOTE — Telephone Encounter (Signed)
Called the [place] and gave and tried to give them pt cardiac rehab appt dates, she stated that she didn't need the dates and that his cardiac rehab Josem Kaufmann is valid until Dec 2023. I advised her to fax over an updated auth form.

## 2022-03-10 ENCOUNTER — Encounter (HOSPITAL_COMMUNITY)
Admission: RE | Admit: 2022-03-10 | Discharge: 2022-03-10 | Disposition: A | Discharge status: Home / Self Care | Payer: No Typology Code available for payment source | Source: Ambulatory Visit | Attending: Cardiology | Admitting: Cardiology

## 2022-03-10 ENCOUNTER — Encounter (HOSPITAL_COMMUNITY): Payer: Self-pay

## 2022-03-10 VITALS — BP 128/56 | HR 67 | Ht 69.25 in | Wt 191.4 lb

## 2022-03-10 DIAGNOSIS — Z955 Presence of coronary angioplasty implant and graft: Secondary | ICD-10-CM | POA: Insufficient documentation

## 2022-03-10 HISTORY — DX: Atherosclerotic heart disease of native coronary artery without angina pectoris: I25.10

## 2022-03-10 LAB — GLUCOSE, CAPILLARY: Glucose-Capillary: 157 mg/dL — ABNORMAL HIGH (ref 70–99)

## 2022-03-10 NOTE — Progress Notes (Signed)
Cardiac Rehab Medication Review by a Nurse  Does the patient  feel that his/her medications are working for him/her?  yes  Has the patient been experiencing any side effects to the medications prescribed?  no  Does the patient measure his/her own blood pressure or blood glucose at home?  yes   Does the patient have any problems obtaining medications due to transportation or finances?   no  Understanding of regimen: good Understanding of indications: good Potential of compliance: good    Nurse comments: Alex Mccarty is taking his medications as prescribed and has a good understanding of what his medications are for. Alex Mccarty checks his BP's once a week.    Christa See [facility] RN 03/10/2022 11:10 AM

## 2022-03-14 ENCOUNTER — Encounter (HOSPITAL_COMMUNITY)
Admission: RE | Admit: 2022-03-14 | Discharge: 2022-03-14 | Disposition: A | Discharge status: Home / Self Care | Payer: No Typology Code available for payment source | Source: Ambulatory Visit | Attending: Cardiology | Admitting: Cardiology

## 2022-03-14 DIAGNOSIS — Z955 Presence of coronary angioplasty implant and graft: Secondary | ICD-10-CM | POA: Diagnosis not present

## 2022-03-14 LAB — GLUCOSE, CAPILLARY
Glucose-Capillary: 122 mg/dL — ABNORMAL HIGH (ref 70–99)
Glucose-Capillary: 129 mg/dL — ABNORMAL HIGH (ref 70–99)

## 2022-03-15 ENCOUNTER — Encounter: Payer: Self-pay | Admitting: Cardiology

## 2022-03-15 ENCOUNTER — Ambulatory Visit (INDEPENDENT_AMBULATORY_CARE_PROVIDER_SITE_OTHER): Payer: No Typology Code available for payment source | Admitting: Cardiology

## 2022-03-15 VITALS — BP 120/50 | HR 62 | Ht 69.25 in | Wt 189.0 lb

## 2022-03-15 DIAGNOSIS — Z8249 Family history of ischemic heart disease and other diseases of the circulatory system: Secondary | ICD-10-CM | POA: Diagnosis not present

## 2022-03-15 DIAGNOSIS — K219 Gastro-esophageal reflux disease without esophagitis: Secondary | ICD-10-CM | POA: Diagnosis not present

## 2022-03-15 DIAGNOSIS — R109 Unspecified abdominal pain: Secondary | ICD-10-CM | POA: Diagnosis not present

## 2022-03-15 DIAGNOSIS — I2510 Atherosclerotic heart disease of native coronary artery without angina pectoris: Secondary | ICD-10-CM | POA: Diagnosis not present

## 2022-03-15 MED ORDER — PANTOPRAZOLE SODIUM 40 MG PO TBEC: 40.0000 mg | DELAYED_RELEASE_TABLET | Freq: Every day | ORAL | 3 refills | Status: AC

## 2022-03-15 MED ORDER — NITROGLYCERIN 0.4 MG SL SUBL: 0.4000 mg | SUBLINGUAL_TABLET | SUBLINGUAL | 3 refills | Status: DC | PRN

## 2022-03-15 NOTE — Assessment & Plan Note (Signed)
Omeprazole used to be better for him than current pantoprazole.  Unfortunately he is on Plavix currently.  Could try Pepcid.

## 2022-03-15 NOTE — Assessment & Plan Note (Signed)
Last nephrologist visit creatinine was 2.48.  Understands the risks with contrast in the future.

## 2022-03-15 NOTE — Progress Notes (Signed)
Cardiac Individual Treatment Plan  Patient Details  Name: Alex Mccarty MRN: [id number] Date of Birth: 1946-05-28 Referring Provider:   Flowsheet Row INTENSIVE CARDIAC REHAB ORIENT from 03/10/2022 in [place]  Referring Provider Candee Furbish, MD       Initial Encounter Date:  [place] from 03/10/2022 in [place]  Date 03/10/22       Visit Diagnosis: 01/28/22 S/P DES x 2 RCA  Patient's Home Medications on Admission:  Current Outpatient Medications:    acetaminophen (TYLENOL) 500 MG tablet, Take 1,000 mg by mouth every 6 (six) hours as needed for moderate pain., Disp: , Rfl:    amLODipine (NORVASC) 2.5 MG tablet, Take 2.5 mg by mouth daily., Disp: , Rfl:    aspirin 81 MG EC tablet, Take 81 mg by mouth at bedtime., Disp: , Rfl:    atorvastatin (LIPITOR) 80 MG tablet, Take 80 mg by mouth at bedtime. , Disp: , Rfl:    calcium elemental as carbonate (TUMS ULTRA 1000) 400 MG chewable tablet, Chew 2,000 mg by mouth daily as needed for heartburn., Disp: , Rfl:    Cholecalciferol (VITAMIN D3) 50 MCG (2000 UT) TABS, Take 2,000 Units by mouth daily., Disp: , Rfl:    clopidogrel (PLAVIX) 75 MG tablet, Take 75 mg by mouth daily., Disp: , Rfl:    cyclobenzaprine (FLEXERIL) 10 MG tablet, Take 10 mg by mouth as needed (muscle pain). , Disp: , Rfl:    gabapentin (NEURONTIN) 300 MG capsule, Take 300 mg by mouth at bedtime., Disp: , Rfl:    Glucosamine-Chondroitin (COSAMIN DS PO), Take 1 capsule by mouth at bedtime., Disp: , Rfl:    LANTUS SOLOSTAR 100 UNIT/ML Solostar Pen, Inject 46 Units into the skin every morning., Disp: , Rfl:    losartan (COZAAR) 100 MG tablet, Take 100 mg by mouth at bedtime., Disp: , Rfl:    nitroGLYCERIN (NITROSTAT) 0.4 MG SL tablet, Place 1 tablet (0.4 mg total) under the tongue every 5 (five) minutes as needed for chest pain., Disp: 25 tablet, Rfl: 2   pantoprazole  (PROTONIX) 40 MG tablet, Take 1 tablet (40 mg total) by mouth daily., Disp: 30 tablet, Rfl: 2   primidone (MYSOLINE) 50 MG tablet, Take 50 mg by mouth at bedtime., Disp: , Rfl:    propranolol (INDERAL) 20 MG tablet, Take 20-40 mg by mouth every morning. Take 40 mg in the morning and 20 mg at lunch time, Disp: , Rfl:    tiZANidine (ZANAFLEX) 4 MG tablet, Take 4 mg by mouth every 6 (six) hours as needed for muscle spasms., Disp: , Rfl:    triamcinolone cream (KENALOG) 0.1 %, Apply 1 Application topically 2 (two) times daily as needed (psoriasis)., Disp: , Rfl:    zinc gluconate 50 MG tablet, Take 50 mg by mouth in the morning., Disp: , Rfl:   Past Medical History: Past Medical History:  Diagnosis Date   Arthritis    Bowel obstruction ([place])    CKD (chronic kidney disease)    unkknown stage per patient   Coronary artery disease    Diabetes mellitus ([place])    Hyperlipidemia    Hypertension    Kidney stones     Tobacco Use: Social History   Tobacco Use  Smoking Status Former  Smokeless Tobacco Never  Tobacco Comments   quit over 40 years ago.Around age [age]     Labs: Review Flowsheet  Latest Ref Rng & Units 09/28/2017 05/01/2021 05/02/2021  Labs for ITP Cardiac and Pulmonary Rehab  Cholestrol 0 - 200 mg/dL - - 97   LDL (calc) 0 - 99 mg/dL - - 55   HDL-C >40 mg/dL - - 25   Trlycerides <150 mg/dL - - 84   Hemoglobin A1c 4.8 - 5.6 % 7.0  6.6  -    Capillary Blood Glucose: Lab Results  Component Value Date   GLUCAP 122 (H) 03/14/2022   GLUCAP 129 (H) 03/14/2022   GLUCAP 157 (H) 03/10/2022   GLUCAP 102 (H) 01/28/2022   GLUCAP 177 (H) 01/28/2022     Exercise Target Goals: Exercise Program Goal: Individual exercise prescription set using results from initial 6 min walk test and THRR while considering  patient's activity barriers and safety.   Exercise Prescription Goal: Starting with aerobic activity 30 plus minutes a day, 3 days per week for initial exercise  prescription. Provide home exercise prescription and guidelines that participant acknowledges understanding prior to discharge.  Activity Barriers & Risk Stratification:  Activity Barriers & Cardiac Risk Stratification - 03/10/22 1214       Activity Barriers & Cardiac Risk Stratification   Activity Barriers Arthritis;Back Problems;Right Hip Replacement;Neck/Spine Problems;Joint Problems;Balance Concerns    Cardiac Risk Stratification High             6 Minute Walk:  6 Minute Walk     Row Name 03/10/22 1103         6 Minute Walk   Phase Initial     Distance 1531 feet     Walk Time 6 minutes     # of Rest Breaks 0     MPH 2.9     METS 3.39     RPE 12     Perceived Dyspnea  0     VO2 Peak 11.9     Symptoms No     Resting HR 69 bpm     Resting BP 128/56     Resting Oxygen Saturation  96 %     Exercise Oxygen Saturation  during 6 min walk 99 %     Max Ex. HR 104 bpm     Max Ex. BP 158/54     2 Minute Post BP 138/56              Oxygen Initial Assessment:   Oxygen Re-Evaluation:   Oxygen Discharge (Final Oxygen Re-Evaluation):   Initial Exercise Prescription:  Initial Exercise Prescription - 03/10/22 1200       Date of Initial Exercise RX and Referring Provider   Date 03/10/22    Referring Provider Candee Furbish, MD    Expected Discharge Date 05/06/22      Recumbant Bike   Level 2    RPM 60    Watts 35    Minutes 15    METs 3.3      NuStep   Level 2    SPM 75    Minutes 15    METs 3.3      Prescription Details   Frequency (times per week) 3    Duration Progress to 30 minutes of continuous aerobic without signs/symptoms of physical distress      Intensity   THRR 40-80% of Max Heartrate 59-118    Ratings of Perceived Exertion 11-13    Perceived Dyspnea 0-4      Progression   Progression Continue progressive overload as per policy without signs/symptoms or physical distress.  Resistance Training   Training Prescription Yes     Weight 3 lbs    Reps 10-15             Perform Capillary Blood Glucose checks as needed.  Exercise Prescription Changes:   Exercise Prescription Changes     Row Name 03/14/22 1400             Response to Exercise   Blood Pressure (Admit) 120/60       Blood Pressure (Exercise) 138/60       Blood Pressure (Exit) 118/72       Heart Rate (Admit) 67 bpm       Heart Rate (Exercise) 91 bpm       Heart Rate (Exit) 71 bpm       Rating of Perceived Exertion (Exercise) 11       Symptoms None       Comments Pt's first day in the CRP2 program       Duration Continue with 30 min of aerobic exercise without signs/symptoms of physical distress.       Intensity THRR unchanged         Progression   Progression Continue to progress workloads to maintain intensity without signs/symptoms of physical distress.       Average METs 2.35         Resistance Training   Training Prescription Yes       Weight 3 lbs       Reps 10-15       Time 10 Minutes         Interval Training   Interval Training No         Recumbant Bike   Level 2       Minutes 15       METs 2.8         T5 Nustep   Level 3       SPM 80       Minutes 15       METs 2.8                Exercise Comments:   Exercise Comments     Row Name 03/14/22 1426           Exercise Comments Pt's first day in the CPR2 program. Pt had no complaints with todays session and is off to a good start.                Exercise Goals and Review:   Exercise Goals     Row Name 03/10/22 1216             Exercise Goals   Increase Physical Activity Yes       Intervention Provide advice, education, support and counseling about physical activity/exercise needs.;Develop an individualized exercise prescription for aerobic and resistive training based on initial evaluation findings, risk stratification, comorbidities and participant's personal goals.       Expected Outcomes Short Term: Attend rehab on a regular basis to  increase amount of physical activity.;Long Term: Add in home exercise to make exercise part of routine and to increase amount of physical activity.;Long Term: Exercising regularly at least 3-5 days a week.       Increase Strength and Stamina Yes       Intervention Provide advice, education, support and counseling about physical activity/exercise needs.;Develop an individualized exercise prescription for aerobic and resistive training based on initial evaluation findings, risk stratification, comorbidities and participant's personal goals.  Expected Outcomes Short Term: Increase workloads from initial exercise prescription for resistance, speed, and METs.;Short Term: Perform resistance training exercises routinely during rehab and add in resistance training at home;Long Term: Improve cardiorespiratory fitness, muscular endurance and strength as measured by increased METs and functional capacity (6MWT)       Able to understand and use rate of perceived exertion (RPE) scale Yes       Intervention Provide education and explanation on how to use RPE scale       Expected Outcomes Short Term: Able to use RPE daily in rehab to express subjective intensity level;Long Term:  Able to use RPE to guide intensity level when exercising independently       Knowledge and understanding of Target Heart Rate Range (THRR) Yes       Intervention Provide education and explanation of THRR including how the numbers were predicted and where they are located for reference       Expected Outcomes Short Term: Able to state/look up THRR;Short Term: Able to use daily as guideline for intensity in rehab;Long Term: Able to use THRR to govern intensity when exercising independently       Understanding of Exercise Prescription Yes       Intervention Provide education, explanation, and written materials on patient's individual exercise prescription       Expected Outcomes Short Term: Able to explain program exercise prescription;Long  Term: Able to explain home exercise prescription to exercise independently                Exercise Goals Re-Evaluation :  Exercise Goals Re-Evaluation     Row Name 03/14/22 1425             Exercise Goal Re-Evaluation   Exercise Goals Review Increase Physical Activity;Increase Strength and Stamina;Able to understand and use rate of perceived exertion (RPE) scale;Knowledge and understanding of Target Heart Rate Range (THRR);Understanding of Exercise Prescription       Comments Pt's first day in the CRP2 program. Pt understands the exercise Rx, RPE scale and THRR       Expected Outcomes Will cotniue to monitor pt and progress exercise workloads as tolerated.                 Discharge Exercise Prescription (Final Exercise Prescription Changes):  Exercise Prescription Changes - 03/14/22 1400       Response to Exercise   Blood Pressure (Admit) 120/60    Blood Pressure (Exercise) 138/60    Blood Pressure (Exit) 118/72    Heart Rate (Admit) 67 bpm    Heart Rate (Exercise) 91 bpm    Heart Rate (Exit) 71 bpm    Rating of Perceived Exertion (Exercise) 11    Symptoms None    Comments Pt's first day in the CRP2 program    Duration Continue with 30 min of aerobic exercise without signs/symptoms of physical distress.    Intensity THRR unchanged      Progression   Progression Continue to progress workloads to maintain intensity without signs/symptoms of physical distress.    Average METs 2.35      Resistance Training   Training Prescription Yes    Weight 3 lbs    Reps 10-15    Time 10 Minutes      Interval Training   Interval Training No      Recumbant Bike   Level 2    Minutes 15    METs 2.8      T5 Nustep  Level 3    SPM 80    Minutes 15    METs 2.8             Nutrition:  Target Goals: Understanding of nutrition guidelines, daily intake of sodium '1500mg'$ , cholesterol '200mg'$ , calories 30% from fat and 7% or less from saturated fats, daily to have 5 or  more servings of fruits and vegetables.  Biometrics:  Pre Biometrics - 03/10/22 1015       Pre Biometrics   Waist Circumference 41 inches    Hip Circumference 41 inches    Waist to Hip Ratio 1 %    Triceps Skinfold 13 mm    % Body Fat 27.6 %    Grip Strength 40 kg    Flexibility --   Not performed due to back problems   Single Leg Stand 10.2 seconds              Nutrition Therapy Plan and Nutrition Goals:  Nutrition Therapy & Goals - 03/14/22 1355       Nutrition Therapy   Diet Heart Healthy/Carbohydrate Consistent    Drug/Food Interactions Statins/Certain Fruits    Protein (specify units) 52-69g (0.6-0.8g/kg actual body weight)      Personal Nutrition Goals   Nutrition Goal Patient to choose a variety of fruits, vegetables, whole grains, lean protein/plant protein, nonfat dairy daily as part of heart healthy diet    Personal Goal #2 Patient to limit sodium intake to '1500mg'$  per day    Personal Goal #3 Patient to identify and limit daily intake of saturated fat, trans fat, sodium, and refined carbohydrates    Comments Patient reports he has previously seen a dietitian related to diabetes diagnosis. Diagnosis of CKD4. Most recent labs GFR 27, potassium 4.9, Cr. 2.4, A1c 7.4, BUN 32.      Intervention Plan   Intervention Prescribe, educate and counsel regarding individualized specific dietary modifications aiming towards targeted core components such as weight, hypertension, lipid management, diabetes, heart failure and other comorbidities.    Expected Outcomes Short Term Goal: Understand basic principles of dietary content, such as calories, fat, sodium, cholesterol and nutrients.;Long Term Goal: Adherence to prescribed nutrition plan.             Nutrition Assessments:  Nutrition Assessments - 03/14/22 1413       Rate Your Plate Scores   Pre Score 41            MEDIFICTS Score Key: ?70 Need to make dietary changes  40-70 Heart Healthy Diet ? 40  Therapeutic Level Cholesterol Diet  Flowsheet Row INTENSIVE CARDIAC REHAB from 03/14/2022 in [place]  Picture Your Plate Total Score on Admission 41      Picture Your Plate Scores: <33 Unhealthy dietary pattern with much room for improvement. 41-50 Dietary pattern unlikely to meet recommendations for good health and room for improvement. 51-60 More healthful dietary pattern, with some room for improvement.  >60 Healthy dietary pattern, although there may be some specific behaviors that could be improved.    Nutrition Goals Re-Evaluation:  Nutrition Goals Re-Evaluation     [place] Name 03/14/22 1355             Goals   Current Weight 191 lb 5.8 oz (86.8 kg)       Comment Diagnosis of CKD4. Most recent labs GFR 27, potassium 4.9, Cr. 2.4, A1c 7.4, BUN 32       Expected Outcome Patient reports he has previously  seen a dietitian related to diabetes diagnosis; he reports uncertainty of what to eat. PYP score reflect low nutrition knowledge.                Nutrition Goals Discharge (Final Nutrition Goals Re-Evaluation):  Nutrition Goals Re-Evaluation - 03/14/22 1355       Goals   Current Weight 191 lb 5.8 oz (86.8 kg)    Comment Diagnosis of CKD4. Most recent labs GFR 27, potassium 4.9, Cr. 2.4, A1c 7.4, BUN 32    Expected Outcome Patient reports he has previously seen a dietitian related to diabetes diagnosis; he reports uncertainty of what to eat. PYP score reflect low nutrition knowledge.             Psychosocial: Target Goals: Acknowledge presence or absence of significant depression and/or stress, maximize coping skills, provide positive support system. Participant is able to verbalize types and ability to use techniques and skills needed for reducing stress and depression.  Initial Review & Psychosocial Screening:  Initial Psych Review & Screening - 03/10/22 1527       Initial Review   Current issues with Current Stress Concerns     Source of Stress Concerns Chronic Illness;Unable to participate in former interests or hobbies;Unable to perform yard/household activities      [place]? Yes   Alex Mccarty has his wife for support     Barriers   Psychosocial barriers to participate in program The patient should benefit from training in stress management and relaxation.      Screening Interventions   Interventions Encouraged to exercise;To provide support and resources with identified psychosocial needs;Provide feedback about the scores to participant    Expected Outcomes Short Term goal: Identification and review with participant of any Quality of Life or Depression concerns found by scoring the questionnaire.;Long Term Goal: Stressors or current issues are controlled or eliminated.;Long Term goal: The participant improves quality of Life and PHQ9 Scores as seen by post scores and/or verbalization of changes             Quality of Life Scores:  Quality of Life - 03/10/22 1225       Quality of Life   Select Quality of Life      Quality of Life Scores   Health/Function Pre 16.77 %    Socioeconomic Pre 22.13 %    Psych/Spiritual Pre 18.86 %    Family Pre 22.8 %    GLOBAL Pre 19.27 %            Scores of 19 and below usually indicate a poorer quality of life in these areas.  A difference of  2-3 points is a clinically meaningful difference.  A difference of 2-3 points in the total score of the Quality of Life Index has been associated with significant improvement in overall quality of life, self-image, physical symptoms, and general health in studies assessing change in quality of life.  PHQ-9: Review Flowsheet       03/10/2022 09/03/2019 12/07/2018  Depression screen PHQ 2/9  Decreased Interest 0 0 0  Down, Depressed, Hopeless 0 0 0  PHQ - 2 Score 0 0 0   Interpretation of Total Score  Total Score Depression Severity:  1-4 = Minimal depression, 5-9 = Mild depression, 10-14 =  Moderate depression, 15-19 = Moderately severe depression, 20-27 = Severe depression   Psychosocial Evaluation and Intervention:   Psychosocial Re-Evaluation:  Psychosocial Re-Evaluation     [place] Name 03/15/22 [id number]  Psychosocial Re-Evaluation   Current issues with Current Stress Concerns       Comments Alex Mccarty did not voice any increased stressors on his first day of exercise at [employer]. Will review quality of life in the next week       Expected Outcomes Alex Mccarty will have decreased stress upon completion of phase 2 cardiac rehab.       Interventions Stress management education;Relaxation education;Encouraged to attend Cardiac Rehabilitation for the exercise       Continue Psychosocial Services  Follow up required by staff         Initial Review   Source of Stress Concerns Chronic Illness;Unable to perform yard/household activities;Unable to participate in former interests or hobbies       Comments Will continue to monitor and offer support as needed                Psychosocial Discharge (Final Psychosocial Re-Evaluation):  Psychosocial Re-Evaluation - 03/15/22 0931       Psychosocial Re-Evaluation   Current issues with Current Stress Concerns    Comments Alex Mccarty did not voice any increased stressors on his first day of exercise at [employer]. Will review quality of life in the next week    Expected Outcomes Alex Mccarty will have decreased stress upon completion of phase 2 cardiac rehab.    Interventions Stress management education;Relaxation education;Encouraged to attend Cardiac Rehabilitation for the exercise    Continue Psychosocial Services  Follow up required by staff      Initial Review   Source of Stress Concerns Chronic Illness;Unable to perform yard/household activities;Unable to participate in former interests or hobbies    Comments Will continue to monitor and offer support as needed             Vocational Rehabilitation: Provide vocational rehab assistance to  qualifying candidates.   Vocational Rehab Evaluation & Intervention:  Vocational Rehab - 03/10/22 1530       Initial Vocational Rehab Evaluation & Intervention   Assessment shows need for Vocational Rehabilitation No   Alex Mccarty is retired and does not need vocational rehab at this time            Education: Education Goals: Education classes will be provided on a weekly basis, covering required topics. Participant will state understanding/return demonstration of topics presented.  Learning Barriers/Preferences:  Learning Barriers/Preferences - 03/10/22 1226       Learning Barriers/Preferences   Learning Barriers Hearing;Sight   Wears glasses and hearing aids   Learning Preferences Computer/Internet;Pictoral;Skilled Demonstration             Education Topics: Hypertension, Hypertension Reduction -Define heart disease and high blood pressure. Discus how high blood pressure affects the body and ways to reduce high blood pressure.   Exercise and Your Heart -Discuss why it is important to exercise, the FITT principles of exercise, normal and abnormal responses to exercise, and how to exercise safely.   Angina -Discuss definition of angina, causes of angina, treatment of angina, and how to decrease risk of having angina.   Cardiac Medications -Review what the following cardiac medications are used for, how they affect the body, and side effects that may occur when taking the medications.  Medications include Aspirin, Beta blockers, calcium Mccarty blockers, ACE Inhibitors, angiotensin receptor blockers, diuretics, digoxin, and antihyperlipidemics.   Congestive Heart Failure -Discuss the definition of CHF, how to live with CHF, the signs and symptoms of CHF, and how keep track of weight and sodium intake.   Heart Disease  and Intimacy -Discus the effect sexual activity has on the heart, how changes occur during intimacy as we age, and safety during sexual  activity.   Smoking Cessation / COPD -Discuss different methods to quit smoking, the health benefits of quitting smoking, and the definition of COPD.   Nutrition I: Fats -Discuss the types of cholesterol, what cholesterol does to the heart, and how cholesterol levels can be controlled.   Nutrition II: Labels -Discuss the different components of food labels and how to read food label   Heart Parts/Heart Disease and PAD -Discuss the anatomy of the heart, the pathway of blood circulation through the heart, and these are affected by heart disease.   Stress I: Signs and Symptoms -Discuss the causes of stress, how stress may lead to anxiety and depression, and ways to limit stress.   Stress II: Relaxation -Discuss different types of relaxation techniques to limit stress.   Warning Signs of Stroke / TIA -Discuss definition of a stroke, what the signs and symptoms are of a stroke, and how to identify when someone is having stroke.   Knowledge Questionnaire Score:  Knowledge Questionnaire Score - 03/10/22 1228       Knowledge Questionnaire Score   Pre Score 21/24             Core Components/Risk Factors/Patient Goals at Admission:  Personal Goals and Risk Factors at Admission - 03/10/22 1228       Core Components/Risk Factors/Patient Goals on Admission    Weight Management Yes;Weight Loss    Intervention Weight Management: Develop a combined nutrition and exercise program designed to reach desired caloric intake, while maintaining appropriate intake of nutrient and fiber, sodium and fats, and appropriate energy expenditure required for the weight goal.;Weight Management: Provide education and appropriate resources to help participant work on and attain dietary goals.;Weight Management/Obesity: Establish reasonable short term and long term weight goals.    Admit Weight 191 lb 5.8 oz (86.8 kg)    Expected Outcomes Short Term: Continue to assess and modify interventions until  short term weight is achieved;Long Term: Adherence to nutrition and physical activity/exercise program aimed toward attainment of established weight goal;Weight Maintenance: Understanding of the daily nutrition guidelines, which includes 25-35% calories from fat, 7% or less cal from saturated fats, less than '200mg'$  cholesterol, less than 1.5gm of sodium, & 5 or more servings of fruits and vegetables daily;Weight Loss: Understanding of general recommendations for a balanced deficit meal plan, which promotes 1-2 lb weight loss per week and includes a negative energy balance of [phone] kcal/d;Understanding recommendations for meals to include 15-35% energy as protein, 25-35% energy from fat, 35-60% energy from carbohydrates, less than '200mg'$  of dietary cholesterol, 20-35 gm of total fiber daily;Understanding of distribution of calorie intake throughout the day with the consumption of 4-5 meals/snacks    Diabetes Yes    Intervention Provide education about signs/symptoms and action to take for hypo/hyperglycemia.;Provide education about proper nutrition, including hydration, and aerobic/resistive exercise prescription along with prescribed medications to achieve blood glucose in normal ranges: Fasting glucose 65-99 mg/dL    Expected Outcomes Short Term: Participant verbalizes understanding of the signs/symptoms and immediate care of hyper/hypoglycemia, proper foot care and importance of medication, aerobic/resistive exercise and nutrition plan for blood glucose control.;Long Term: Attainment of HbA1C < 7%.    Hypertension Yes    Intervention Provide education on lifestyle modifcations including regular physical activity/exercise, weight management, moderate sodium restriction and increased consumption of fresh fruit, vegetables, and low fat dairy, alcohol  moderation, and smoking cessation.;Monitor prescription use compliance.    Expected Outcomes Short Term: Continued assessment and intervention until BP is <  140/27m HG in hypertensive participants. < 130/825mHG in hypertensive participants with diabetes, heart failure or chronic kidney disease.;Long Term: Maintenance of blood pressure at goal levels.    Lipids Yes    Intervention Provide education and support for participant on nutrition & aerobic/resistive exercise along with prescribed medications to achieve LDL '70mg'$ , HDL >'40mg'$ .    Expected Outcomes Short Term: Participant states understanding of desired cholesterol values and is compliant with medications prescribed. Participant is following exercise prescription and nutrition guidelines.;Long Term: Cholesterol controlled with medications as prescribed, with individualized exercise RX and with personalized nutrition plan. Value goals: LDL < '70mg'$ , HDL > 40 mg.    Stress Yes    Intervention Offer individual and/or small group education and counseling on adjustment to heart disease, stress management and health-related lifestyle change. Teach and support self-help strategies.;Refer participants experiencing significant psychosocial distress to appropriate mental health specialists for further evaluation and treatment. When possible, include family members and significant others in education/counseling sessions.    Expected Outcomes Short Term: Participant demonstrates changes in health-related behavior, relaxation and other stress management skills, ability to obtain effective social support, and compliance with psychotropic medications if prescribed.;Long Term: Emotional wellbeing is indicated by absence of clinically significant psychosocial distress or social isolation.             Core Components/Risk Factors/Patient Goals Review:   Goals and Risk Factor Review     Row Name 03/15/22 0933             Core Components/Risk Factors/Patient Goals Review   Personal Goals Review Weight Management/Obesity;Hypertension;Lipids;Diabetes;Stress       Review [facility]il[facility]anneltarted  intensive cardiac rehab on  03/14/22. Alex Mccarty did well with exercise. Vital signs and CBG's were stable       Expected Outcomes RiLiliane Channelill contiue to participate in phase 2 cardiac rehab for exercise, nutrition and lifestyle modifications                Core Components/Risk Factors/Patient Goals at Discharge (Final Review):   Goals and Risk Factor Review - 03/15/22 0933       Core Components/Risk Factors/Patient Goals Review   Personal Goals Review Weight Management/Obesity;Hypertension;Lipids;Diabetes;Stress    Review Ri[facility]il[facility] Channeltarted  intensive cardiac rehab on 03/14/22. Alex Mccarty did well with exercise. Vital signs and CBG's were stable    Expected Outcomes RiLiliane Channelill contiue to participate in phase 2 cardiac rehab for exercise, nutrition and lifestyle modifications             ITP Comments:  ITP Comments     Row Name 03/10/22 1520 03/15/22 0925         ITP Comments Dr TrFransico HimD, Medical Director Introduction to [facility]/ Initial [facility] Orientation Packet Reviewed with the patient and his wife. 30 Day ITP Review. Ri[facility]il[facility] Channeltarted intensvie cardiac rehab on 03/14/22. Alex Mccarty did well with exercise.               Comments: See ITP comments.MaHarrell GaveN BSN

## 2022-03-15 NOTE — Assessment & Plan Note (Addendum)
Right coronary artery stenting x2.  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  Discussed at length.  Has chronic kidney disease.  Overall started cardiac rehab.  He does wish he had more energy.  We went through his medications.  The propranolol has been on for quite some time for essential tremor.  This nonselective beta-blocker can cause fatigue.  Also gabapentin although he is only taking it at night can cause fatigue as well.  Encouraged continued exercise.  I do believe he can perform this safely.  Continue with dual antiplatelet therapy for a total of 6 months.  In the future, need to weigh the risks and benefits of using coronary CT to monitor left main aneurysm given contrast load and chronic kidney disease stage IV.  Multiple colleagues discussion.

## 2022-03-16 ENCOUNTER — Encounter (HOSPITAL_COMMUNITY)
Admission: RE | Admit: 2022-03-16 | Discharge: 2022-03-16 | Disposition: A | Discharge status: Home / Self Care | Payer: No Typology Code available for payment source | Source: Ambulatory Visit | Attending: Cardiology | Admitting: Cardiology

## 2022-03-16 DIAGNOSIS — Z955 Presence of coronary angioplasty implant and graft: Secondary | ICD-10-CM

## 2022-03-16 LAB — GLUCOSE, CAPILLARY
Glucose-Capillary: 138 mg/dL — ABNORMAL HIGH (ref 70–99)
Glucose-Capillary: 115 mg/dL — ABNORMAL HIGH (ref 70–99)

## 2022-03-16 NOTE — Progress Notes (Incomplete)
Cardiac Individual Treatment Plan  Patient Details  Name: RAFI KENNETH MRN: [id number] Date of Birth: Jul 02, 1946 Referring Provider:   Flowsheet Row INTENSIVE CARDIAC REHAB ORIENT from 03/10/2022 in [place]  Referring Provider Candee Furbish, MD       Initial Encounter Date:  [facility] from 03/10/2022 in [place]  Date 03/10/22       Visit Diagnosis: 01/28/22 S/P DES x 2 RCA  Patient's Home Medications on Admission:  Current Outpatient Medications:    acetaminophen (TYLENOL) 500 MG tablet, Take 1,000 mg by mouth every 6 (six) hours as needed for moderate pain., Disp: , Rfl:    amLODipine (NORVASC) 2.5 MG tablet, Take 2.5 mg by mouth daily., Disp: , Rfl:    aspirin 81 MG EC tablet, Take 81 mg by mouth at bedtime., Disp: , Rfl:    atorvastatin (LIPITOR) 80 MG tablet, Take 80 mg by mouth at bedtime. , Disp: , Rfl:    calcium elemental as carbonate (TUMS ULTRA 1000) 400 MG chewable tablet, Chew 2,000 mg by mouth daily as needed for heartburn., Disp: , Rfl:    Cholecalciferol (VITAMIN D3) 50 MCG (2000 UT) TABS, Take 2,000 Units by mouth daily., Disp: , Rfl:    clopidogrel (PLAVIX) 75 MG tablet, Take 75 mg by mouth daily., Disp: , Rfl:    cyclobenzaprine (FLEXERIL) 10 MG tablet, Take 10 mg by mouth as needed (muscle pain). , Disp: , Rfl:    gabapentin (NEURONTIN) 300 MG capsule, Take 300 mg by mouth at bedtime., Disp: , Rfl:    Glucosamine-Chondroitin (COSAMIN DS PO), Take 1 capsule by mouth at bedtime., Disp: , Rfl:    LANTUS SOLOSTAR 100 UNIT/ML Solostar Pen, Inject 46 Units into the skin every morning., Disp: , Rfl:    losartan (COZAAR) 100 MG tablet, Take 100 mg by mouth at bedtime., Disp: , Rfl:    nitroGLYCERIN (NITROSTAT) 0.4 MG SL tablet, Place 1 tablet (0.4 mg total) under the tongue every 5 (five) minutes as needed for chest pain., Disp: 25 tablet, Rfl: 3   pantoprazole  (PROTONIX) 40 MG tablet, Take 1 tablet (40 mg total) by mouth daily., Disp: 90 tablet, Rfl: 3   primidone (MYSOLINE) 50 MG tablet, Take 50 mg by mouth at bedtime., Disp: , Rfl:    propranolol (INDERAL) 20 MG tablet, Take 20-40 mg by mouth every morning. Take 40 mg in the morning and 20 mg at lunch time, Disp: , Rfl:    tiZANidine (ZANAFLEX) 4 MG tablet, Take 4 mg by mouth every 6 (six) hours as needed for muscle spasms., Disp: , Rfl:    triamcinolone cream (KENALOG) 0.1 %, Apply 1 Application topically 2 (two) times daily as needed (psoriasis)., Disp: , Rfl:    zinc gluconate 50 MG tablet, Take 50 mg by mouth in the morning., Disp: , Rfl:   Past Medical History: Past Medical History:  Diagnosis Date   Arthritis    Bowel obstruction ([place])    CKD (chronic kidney disease)    unkknown stage per patient   Coronary artery disease    Diabetes mellitus ([place])    Hyperlipidemia    Hypertension    Kidney stones     Tobacco Use: Social History   Tobacco Use  Smoking Status Former  Smokeless Tobacco Never  Tobacco Comments   quit over 40 years ago.Around age [age]     Labs: Review Flowsheet  Latest Ref Rng & Units 09/28/2017 05/01/2021 05/02/2021  Labs for ITP Cardiac and Pulmonary Rehab  Cholestrol 0 - 200 mg/dL - - 97   LDL (calc) 0 - 99 mg/dL - - 55   HDL-C >40 mg/dL - - 25   Trlycerides <150 mg/dL - - 84   Hemoglobin A1c 4.8 - 5.6 % 7.0  6.6  -    Capillary Blood Glucose: Lab Results  Component Value Date   GLUCAP 115 (H) 03/16/2022   GLUCAP 138 (H) 03/16/2022   GLUCAP 122 (H) 03/14/2022   GLUCAP 129 (H) 03/14/2022   GLUCAP 157 (H) 03/10/2022     Exercise Target Goals: Exercise Program Goal: Individual exercise prescription set using results from initial 6 min walk test and THRR while considering  patient's activity barriers and safety.   Exercise Prescription Goal: Starting with aerobic activity 30 plus minutes a day, 3 days per week for initial exercise  prescription. Provide home exercise prescription and guidelines that participant acknowledges understanding prior to discharge.  Activity Barriers & Risk Stratification:  Activity Barriers & Cardiac Risk Stratification - 03/10/22 1214       Activity Barriers & Cardiac Risk Stratification   Activity Barriers Arthritis;Back Problems;Right Hip Replacement;Neck/Spine Problems;Joint Problems;Balance Concerns    Cardiac Risk Stratification High             6 Minute Walk:  6 Minute Walk     Row Name 03/10/22 1103         6 Minute Walk   Phase Initial     Distance 1531 feet     Walk Time 6 minutes     # of Rest Breaks 0     MPH 2.9     METS 3.39     RPE 12     Perceived Dyspnea  0     VO2 Peak 11.9     Symptoms No     Resting HR 69 bpm     Resting BP 128/56     Resting Oxygen Saturation  96 %     Exercise Oxygen Saturation  during 6 min walk 99 %     Max Ex. HR 104 bpm     Max Ex. BP 158/54     2 Minute Post BP 138/56              Oxygen Initial Assessment:   Oxygen Re-Evaluation:   Oxygen Discharge (Final Oxygen Re-Evaluation):   Initial Exercise Prescription:  Initial Exercise Prescription - 03/10/22 1200       Date of Initial Exercise RX and Referring Provider   Date 03/10/22    Referring Provider Candee Furbish, MD    Expected Discharge Date 05/06/22      Recumbant Bike   Level 2    RPM 60    Watts 35    Minutes 15    METs 3.3      NuStep   Level 2    SPM 75    Minutes 15    METs 3.3      Prescription Details   Frequency (times per week) 3    Duration Progress to 30 minutes of continuous aerobic without signs/symptoms of physical distress      Intensity   THRR 40-80% of Max Heartrate 59-118    Ratings of Perceived Exertion 11-13    Perceived Dyspnea 0-4      Progression   Progression Continue progressive overload as per policy without signs/symptoms or physical distress.  Resistance Training   Training Prescription Yes     Weight 3 lbs    Reps 10-15             Perform Capillary Blood Glucose checks as needed.  Exercise Prescription Changes:   Exercise Prescription Changes     Row Name 03/14/22 1400             Response to Exercise   Blood Pressure (Admit) 120/60       Blood Pressure (Exercise) 138/60       Blood Pressure (Exit) 118/72       Heart Rate (Admit) 67 bpm       Heart Rate (Exercise) 91 bpm       Heart Rate (Exit) 71 bpm       Rating of Perceived Exertion (Exercise) 11       Symptoms None       Comments Pt's first day in the CRP2 program       Duration Continue with 30 min of aerobic exercise without signs/symptoms of physical distress.       Intensity THRR unchanged         Progression   Progression Continue to progress workloads to maintain intensity without signs/symptoms of physical distress.       Average METs 2.35         Resistance Training   Training Prescription Yes       Weight 3 lbs       Reps 10-15       Time 10 Minutes         Interval Training   Interval Training No         Recumbant Bike   Level 2       Minutes 15       METs 2.8         T5 Nustep   Level 3       SPM 80       Minutes 15       METs 2.8                Exercise Comments:   Exercise Comments     Row Name 03/14/22 1426           Exercise Comments Pt's first day in the CPR2 program. Pt had no complaints with todays session and is off to a good start.                Exercise Goals and Review:   Exercise Goals     Row Name 03/10/22 1216             Exercise Goals   Increase Physical Activity Yes       Intervention Provide advice, education, support and counseling about physical activity/exercise needs.;Develop an individualized exercise prescription for aerobic and resistive training based on initial evaluation findings, risk stratification, comorbidities and participant's personal goals.       Expected Outcomes Short Term: Attend rehab on a regular basis to  increase amount of physical activity.;Long Term: Add in home exercise to make exercise part of routine and to increase amount of physical activity.;Long Term: Exercising regularly at least 3-5 days a week.       Increase Strength and Stamina Yes       Intervention Provide advice, education, support and counseling about physical activity/exercise needs.;Develop an individualized exercise prescription for aerobic and resistive training based on initial evaluation findings, risk stratification, comorbidities and participant's personal goals.  Expected Outcomes Short Term: Increase workloads from initial exercise prescription for resistance, speed, and METs.;Short Term: Perform resistance training exercises routinely during rehab and add in resistance training at home;Long Term: Improve cardiorespiratory fitness, muscular endurance and strength as measured by increased METs and functional capacity (6MWT)       Able to understand and use rate of perceived exertion (RPE) scale Yes       Intervention Provide education and explanation on how to use RPE scale       Expected Outcomes Short Term: Able to use RPE daily in rehab to express subjective intensity level;Long Term:  Able to use RPE to guide intensity level when exercising independently       Knowledge and understanding of Target Heart Rate Range (THRR) Yes       Intervention Provide education and explanation of THRR including how the numbers were predicted and where they are located for reference       Expected Outcomes Short Term: Able to state/look up THRR;Short Term: Able to use daily as guideline for intensity in rehab;Long Term: Able to use THRR to govern intensity when exercising independently       Understanding of Exercise Prescription Yes       Intervention Provide education, explanation, and written materials on patient's individual exercise prescription       Expected Outcomes Short Term: Able to explain program exercise prescription;Long  Term: Able to explain home exercise prescription to exercise independently                Exercise Goals Re-Evaluation :  Exercise Goals Re-Evaluation     Row Name 03/14/22 1425             Exercise Goal Re-Evaluation   Exercise Goals Review Increase Physical Activity;Increase Strength and Stamina;Able to understand and use rate of perceived exertion (RPE) scale;Knowledge and understanding of Target Heart Rate Range (THRR);Understanding of Exercise Prescription       Comments Pt's first day in the CRP2 program. Pt understands the exercise Rx, RPE scale and THRR       Expected Outcomes Will cotniue to monitor pt and progress exercise workloads as tolerated.                 Discharge Exercise Prescription (Final Exercise Prescription Changes):  Exercise Prescription Changes - 03/14/22 1400       Response to Exercise   Blood Pressure (Admit) 120/60    Blood Pressure (Exercise) 138/60    Blood Pressure (Exit) 118/72    Heart Rate (Admit) 67 bpm    Heart Rate (Exercise) 91 bpm    Heart Rate (Exit) 71 bpm    Rating of Perceived Exertion (Exercise) 11    Symptoms None    Comments Pt's first day in the CRP2 program    Duration Continue with 30 min of aerobic exercise without signs/symptoms of physical distress.    Intensity THRR unchanged      Progression   Progression Continue to progress workloads to maintain intensity without signs/symptoms of physical distress.    Average METs 2.35      Resistance Training   Training Prescription Yes    Weight 3 lbs    Reps 10-15    Time 10 Minutes      Interval Training   Interval Training No      Recumbant Bike   Level 2    Minutes 15    METs 2.8      T5 Nustep  Level 3    SPM 80    Minutes 15    METs 2.8             Nutrition:  Target Goals: Understanding of nutrition guidelines, daily intake of sodium '1500mg'$ , cholesterol '200mg'$ , calories 30% from fat and 7% or less from saturated fats, daily to have 5 or  more servings of fruits and vegetables.  Biometrics:  Pre Biometrics - 03/10/22 1015       Pre Biometrics   Waist Circumference 41 inches    Hip Circumference 41 inches    Waist to Hip Ratio 1 %    Triceps Skinfold 13 mm    % Body Fat 27.6 %    Grip Strength 40 kg    Flexibility --   Not performed due to back problems   Single Leg Stand 10.2 seconds              Nutrition Therapy Plan and Nutrition Goals:  Nutrition Therapy & Goals - 03/14/22 1355       Nutrition Therapy   Diet Heart Healthy/Carbohydrate Consistent    Drug/Food Interactions Statins/Certain Fruits    Protein (specify units) 52-69g (0.6-0.8g/kg actual body weight)      Personal Nutrition Goals   Nutrition Goal Patient to choose a variety of fruits, vegetables, whole grains, lean protein/plant protein, nonfat dairy daily as part of heart healthy diet    Personal Goal #2 Patient to limit sodium intake to '1500mg'$  per day    Personal Goal #3 Patient to identify and limit daily intake of saturated fat, trans fat, sodium, and refined carbohydrates    Comments Patient reports he has previously seen a dietitian related to diabetes diagnosis. Diagnosis of CKD4. Most recent labs GFR 27, potassium 4.9, Cr. 2.4, A1c 7.4, BUN 32.      Intervention Plan   Intervention Prescribe, educate and counsel regarding individualized specific dietary modifications aiming towards targeted core components such as weight, hypertension, lipid management, diabetes, heart failure and other comorbidities.    Expected Outcomes Short Term Goal: Understand basic principles of dietary content, such as calories, fat, sodium, cholesterol and nutrients.;Long Term Goal: Adherence to prescribed nutrition plan.             Nutrition Assessments:  Nutrition Assessments - 03/14/22 1413       Rate Your Plate Scores   Pre Score 41            MEDIFICTS Score Key: ?70 Need to make dietary changes  40-70 Heart Healthy Diet ? 40  Therapeutic Level Cholesterol Diet  Flowsheet Row INTENSIVE CARDIAC REHAB from 03/14/2022 in [facility]  Picture Your Plate Total Score on Admission 41      Picture Your Plate Scores: <33 Unhealthy dietary pattern with much room for improvement. 41-50 Dietary pattern unlikely to meet recommendations for good health and room for improvement. 51-60 More healthful dietary pattern, with some room for improvement.  >60 Healthy dietary pattern, although there may be some specific behaviors that could be improved.    Nutrition Goals Re-Evaluation:  Nutrition Goals Re-Evaluation     [place] Name 03/14/22 1355             Goals   Current Weight 191 lb 5.8 oz (86.8 kg)       Comment Diagnosis of CKD4. Most recent labs GFR 27, potassium 4.9, Cr. 2.4, A1c 7.4, BUN 32       Expected Outcome Patient reports he has previously  seen a dietitian related to diabetes diagnosis; he reports uncertainty of what to eat. PYP score reflect low nutrition knowledge.                Nutrition Goals Discharge (Final Nutrition Goals Re-Evaluation):  Nutrition Goals Re-Evaluation - 03/14/22 1355       Goals   Current Weight 191 lb 5.8 oz (86.8 kg)    Comment Diagnosis of CKD4. Most recent labs GFR 27, potassium 4.9, Cr. 2.4, A1c 7.4, BUN 32    Expected Outcome Patient reports he has previously seen a dietitian related to diabetes diagnosis; he reports uncertainty of what to eat. PYP score reflect low nutrition knowledge.             Psychosocial: Target Goals: Acknowledge presence or absence of significant depression and/or stress, maximize coping skills, provide positive support system. Participant is able to verbalize types and ability to use techniques and skills needed for reducing stress and depression.  Initial Review & Psychosocial Screening:  Initial Psych Review & Screening - 03/10/22 1527       Initial Review   Current issues with Current Stress Concerns     Source of Stress Concerns Chronic Illness;Unable to participate in former interests or hobbies;Unable to perform yard/household activities      [place]? Yes   Liliane Channel has his wife for support     Barriers   Psychosocial barriers to participate in program The patient should benefit from training in stress management and relaxation.      Screening Interventions   Interventions Encouraged to exercise;To provide support and resources with identified psychosocial needs;Provide feedback about the scores to participant    Expected Outcomes Short Term goal: Identification and review with participant of any Quality of Life or Depression concerns found by scoring the questionnaire.;Long Term Goal: Stressors or current issues are controlled or eliminated.;Long Term goal: The participant improves quality of Life and PHQ9 Scores as seen by post scores and/or verbalization of changes             Quality of Life Scores:  Quality of Life - 03/10/22 1225       Quality of Life   Select Quality of Life      Quality of Life Scores   Health/Function Pre 16.77 %    Socioeconomic Pre 22.13 %    Psych/Spiritual Pre 18.86 %    Family Pre 22.8 %    GLOBAL Pre 19.27 %            Scores of 19 and below usually indicate a poorer quality of life in these areas.  A difference of  2-3 points is a clinically meaningful difference.  A difference of 2-3 points in the total score of the Quality of Life Index has been associated with significant improvement in overall quality of life, self-image, physical symptoms, and general health in studies assessing change in quality of life.  PHQ-9: Review Flowsheet       03/10/2022 09/03/2019 12/07/2018  Depression screen PHQ 2/9  Decreased Interest 0 0 0  Down, Depressed, Hopeless 0 0 0  PHQ - 2 Score 0 0 0   Interpretation of Total Score  Total Score Depression Severity:  1-4 = Minimal depression, 5-9 = Mild depression, 10-14 =  Moderate depression, 15-19 = Moderately severe depression, 20-27 = Severe depression   Psychosocial Evaluation and Intervention:   Psychosocial Re-Evaluation:  Psychosocial Re-Evaluation     [place] Name 03/15/22 [id number]  Psychosocial Re-Evaluation   Current issues with Current Stress Concerns       Comments Liliane Channel did not voice any increased stressors on his first day of exercise at [employer]. Will review quality of life in the next week       Expected Outcomes Liliane Channel will have decreased stress upon completion of phase 2 cardiac rehab.       Interventions Stress management education;Relaxation education;Encouraged to attend Cardiac Rehabilitation for the exercise       Continue Psychosocial Services  Follow up required by staff         Initial Review   Source of Stress Concerns Chronic Illness;Unable to perform yard/household activities;Unable to participate in former interests or hobbies       Comments Will continue to monitor and offer support as needed                Psychosocial Discharge (Final Psychosocial Re-Evaluation):  Psychosocial Re-Evaluation - 03/15/22 0931       Psychosocial Re-Evaluation   Current issues with Current Stress Concerns    Comments Liliane Channel did not voice any increased stressors on his first day of exercise at [employer]. Will review quality of life in the next week    Expected Outcomes Liliane Channel will have decreased stress upon completion of phase 2 cardiac rehab.    Interventions Stress management education;Relaxation education;Encouraged to attend Cardiac Rehabilitation for the exercise    Continue Psychosocial Services  Follow up required by staff      Initial Review   Source of Stress Concerns Chronic Illness;Unable to perform yard/household activities;Unable to participate in former interests or hobbies    Comments Will continue to monitor and offer support as needed             Vocational Rehabilitation: Provide vocational rehab assistance to  qualifying candidates.   Vocational Rehab Evaluation & Intervention:  Vocational Rehab - 03/10/22 1530       Initial Vocational Rehab Evaluation & Intervention   Assessment shows need for Vocational Rehabilitation No   Liliane Channel is retired and does not need vocational rehab at this time            Education: Education Goals: Education classes will be provided on a weekly basis, covering required topics. Participant will state understanding/return demonstration of topics presented.  Learning Barriers/Preferences:  Learning Barriers/Preferences - 03/10/22 1226       Learning Barriers/Preferences   Learning Barriers Hearing;Sight   Wears glasses and hearing aids   Learning Preferences Computer/Internet;Pictoral;Skilled Demonstration             Education Topics: Hypertension, Hypertension Reduction -Define heart disease and high blood pressure. Discus how high blood pressure affects the body and ways to reduce high blood pressure.   Exercise and Your Heart -Discuss why it is important to exercise, the FITT principles of exercise, normal and abnormal responses to exercise, and how to exercise safely.   Angina -Discuss definition of angina, causes of angina, treatment of angina, and how to decrease risk of having angina.   Cardiac Medications -Review what the following cardiac medications are used for, how they affect the body, and side effects that may occur when taking the medications.  Medications include Aspirin, Beta blockers, calcium channel blockers, ACE Inhibitors, angiotensin receptor blockers, diuretics, digoxin, and antihyperlipidemics.   Congestive Heart Failure -Discuss the definition of CHF, how to live with CHF, the signs and symptoms of CHF, and how keep track of weight and sodium intake.   Heart Disease  and Intimacy -Discus the effect sexual activity has on the heart, how changes occur during intimacy as we age, and safety during sexual  activity.   Smoking Cessation / COPD -Discuss different methods to quit smoking, the health benefits of quitting smoking, and the definition of COPD.   Nutrition I: Fats -Discuss the types of cholesterol, what cholesterol does to the heart, and how cholesterol levels can be controlled.   Nutrition II: Labels -Discuss the different components of food labels and how to read food label   Heart Parts/Heart Disease and PAD -Discuss the anatomy of the heart, the pathway of blood circulation through the heart, and these are affected by heart disease.   Stress I: Signs and Symptoms -Discuss the causes of stress, how stress may lead to anxiety and depression, and ways to limit stress.   Stress II: Relaxation -Discuss different types of relaxation techniques to limit stress.   Warning Signs of Stroke / TIA -Discuss definition of a stroke, what the signs and symptoms are of a stroke, and how to identify when someone is having stroke.   Knowledge Questionnaire Score:  Knowledge Questionnaire Score - 03/10/22 1228       Knowledge Questionnaire Score   Pre Score 21/24             Core Components/Risk Factors/Patient Goals at Admission:  Personal Goals and Risk Factors at Admission - 03/10/22 1228       Core Components/Risk Factors/Patient Goals on Admission    Weight Management Yes;Weight Loss    Intervention Weight Management: Develop a combined nutrition and exercise program designed to reach desired caloric intake, while maintaining appropriate intake of nutrient and fiber, sodium and fats, and appropriate energy expenditure required for the weight goal.;Weight Management: Provide education and appropriate resources to help participant work on and attain dietary goals.;Weight Management/Obesity: Establish reasonable short term and long term weight goals.    Admit Weight 191 lb 5.8 oz (86.8 kg)    Expected Outcomes Short Term: Continue to assess and modify interventions until  short term weight is achieved;Long Term: Adherence to nutrition and physical activity/exercise program aimed toward attainment of established weight goal;Weight Maintenance: Understanding of the daily nutrition guidelines, which includes 25-35% calories from fat, 7% or less cal from saturated fats, less than '200mg'$  cholesterol, less than 1.5gm of sodium, & 5 or more servings of fruits and vegetables daily;Weight Loss: Understanding of general recommendations for a balanced deficit meal plan, which promotes 1-2 lb weight loss per week and includes a negative energy balance of [phone] kcal/d;Understanding recommendations for meals to include 15-35% energy as protein, 25-35% energy from fat, 35-60% energy from carbohydrates, less than '200mg'$  of dietary cholesterol, 20-35 gm of total fiber daily;Understanding of distribution of calorie intake throughout the day with the consumption of 4-5 meals/snacks    Diabetes Yes    Intervention Provide education about signs/symptoms and action to take for hypo/hyperglycemia.;Provide education about proper nutrition, including hydration, and aerobic/resistive exercise prescription along with prescribed medications to achieve blood glucose in normal ranges: Fasting glucose 65-99 mg/dL    Expected Outcomes Short Term: Participant verbalizes understanding of the signs/symptoms and immediate care of hyper/hypoglycemia, proper foot care and importance of medication, aerobic/resistive exercise and nutrition plan for blood glucose control.;Long Term: Attainment of HbA1C < 7%.    Hypertension Yes    Intervention Provide education on lifestyle modifcations including regular physical activity/exercise, weight management, moderate sodium restriction and increased consumption of fresh fruit, vegetables, and low fat dairy, alcohol  moderation, and smoking cessation.;Monitor prescription use compliance.    Expected Outcomes Short Term: Continued assessment and intervention until BP is <  140/53m HG in hypertensive participants. < 130/869mHG in hypertensive participants with diabetes, heart failure or chronic kidney disease.;Long Term: Maintenance of blood pressure at goal levels.    Lipids Yes    Intervention Provide education and support for participant on nutrition & aerobic/resistive exercise along with prescribed medications to achieve LDL '70mg'$ , HDL >'40mg'$ .    Expected Outcomes Short Term: Participant states understanding of desired cholesterol values and is compliant with medications prescribed. Participant is following exercise prescription and nutrition guidelines.;Long Term: Cholesterol controlled with medications as prescribed, with individualized exercise RX and with personalized nutrition plan. Value goals: LDL < '70mg'$ , HDL > 40 mg.    Stress Yes    Intervention Offer individual and/or small group education and counseling on adjustment to heart disease, stress management and health-related lifestyle change. Teach and support self-help strategies.;Refer participants experiencing significant psychosocial distress to appropriate mental health specialists for further evaluation and treatment. When possible, include family members and significant others in education/counseling sessions.    Expected Outcomes Short Term: Participant demonstrates changes in health-related behavior, relaxation and other stress management skills, ability to obtain effective social support, and compliance with psychotropic medications if prescribed.;Long Term: Emotional wellbeing is indicated by absence of clinically significant psychosocial distress or social isolation.             Core Components/Risk Factors/Patient Goals Review:   Goals and Risk Factor Review     Row Name 03/15/22 0933             Core Components/Risk Factors/Patient Goals Review   Personal Goals Review Weight Management/Obesity;Hypertension;Lipids;Diabetes;Stress       Review Ri[facility]il[facility] Channeltarted  intensive cardiac rehab on  03/14/22. Rick did well with exercise. Vital signs and CBG's were stable       Expected Outcomes RiLiliane Channelill contiue to participate in phase 2 cardiac rehab for exercise, nutrition and lifestyle modifications                Core Components/Risk Factors/Patient Goals at Discharge (Final Review):   Goals and Risk Factor Review - 03/15/22 0933       Core Components/Risk Factors/Patient Goals Review   Personal Goals Review Weight Management/Obesity;Hypertension;Lipids;Diabetes;Stress    Review RiLil[facility] Channeltarted  intensive cardiac rehab on 03/14/22. Rick did well with exercise. Vital signs and CBG's were stable    Expected Outcomes RiLiliane Channelill contiue to participate in phase 2 cardiac rehab for exercise, nutrition and lifestyle modifications             ITP Comments:  ITP Comments     Row Name 03/10/22 1520 03/15/22 0925         ITP Comments Dr TrFransico HimD, Medical Director Introduction to [facility]/ Initial [facility] Orientation Packet Reviewed with the patient and his wife. 30 Day ITP Review. RiLiliane Channeltarted intensvie cardiac rehab on 03/14/22. Rick did well with exercise.               Comments: ***

## 2022-03-16 NOTE — Progress Notes (Signed)
QUALITY OF LIFE SCORE REVIEW  Pt completed Quality of Life survey as a participant in Cardiac Rehab.  Scores 19.0 or below are considered low.  Pt score very low in several areas Overall 19.27, Health and Function 16.77, socioeconomic 22.13, physiological and spiritual 18.86, family 22.80. Patient quality of life slightly altered by physical constraints which limits ability to perform as prior to recent cardiac illness. Patient denies being depressed . Has good family support. Offered emotional support and reassurance.  Will continue to monitor and intervene as necessary. Harrell Gave RN BSN

## 2022-03-16 NOTE — Progress Notes (Incomplete)
Cardiac Individual Treatment Plan  Patient Details  Name: Alex Mccarty MRN: [id number] Date of Birth: Jul 02, 1946 Referring Provider:   Flowsheet Row INTENSIVE CARDIAC REHAB ORIENT from 03/10/2022 in [place]  Referring Provider Candee Furbish, MD       Initial Encounter Date:  [facility] from 03/10/2022 in [place]  Date 03/10/22       Visit Diagnosis: 01/28/22 S/P DES x 2 RCA  Patient's Home Medications on Admission:  Current Outpatient Medications:    acetaminophen (TYLENOL) 500 MG tablet, Take 1,000 mg by mouth every 6 (six) hours as needed for moderate pain., Disp: , Rfl:    amLODipine (NORVASC) 2.5 MG tablet, Take 2.5 mg by mouth daily., Disp: , Rfl:    aspirin 81 MG EC tablet, Take 81 mg by mouth at bedtime., Disp: , Rfl:    atorvastatin (LIPITOR) 80 MG tablet, Take 80 mg by mouth at bedtime. , Disp: , Rfl:    calcium elemental as carbonate (TUMS ULTRA 1000) 400 MG chewable tablet, Chew 2,000 mg by mouth daily as needed for heartburn., Disp: , Rfl:    Cholecalciferol (VITAMIN D3) 50 MCG (2000 UT) TABS, Take 2,000 Units by mouth daily., Disp: , Rfl:    clopidogrel (PLAVIX) 75 MG tablet, Take 75 mg by mouth daily., Disp: , Rfl:    cyclobenzaprine (FLEXERIL) 10 MG tablet, Take 10 mg by mouth as needed (muscle pain). , Disp: , Rfl:    gabapentin (NEURONTIN) 300 MG capsule, Take 300 mg by mouth at bedtime., Disp: , Rfl:    Glucosamine-Chondroitin (COSAMIN DS PO), Take 1 capsule by mouth at bedtime., Disp: , Rfl:    LANTUS SOLOSTAR 100 UNIT/ML Solostar Pen, Inject 46 Units into the skin every morning., Disp: , Rfl:    losartan (COZAAR) 100 MG tablet, Take 100 mg by mouth at bedtime., Disp: , Rfl:    nitroGLYCERIN (NITROSTAT) 0.4 MG SL tablet, Place 1 tablet (0.4 mg total) under the tongue every 5 (five) minutes as needed for chest pain., Disp: 25 tablet, Rfl: 3   pantoprazole  (PROTONIX) 40 MG tablet, Take 1 tablet (40 mg total) by mouth daily., Disp: 90 tablet, Rfl: 3   primidone (MYSOLINE) 50 MG tablet, Take 50 mg by mouth at bedtime., Disp: , Rfl:    propranolol (INDERAL) 20 MG tablet, Take 20-40 mg by mouth every morning. Take 40 mg in the morning and 20 mg at lunch time, Disp: , Rfl:    tiZANidine (ZANAFLEX) 4 MG tablet, Take 4 mg by mouth every 6 (six) hours as needed for muscle spasms., Disp: , Rfl:    triamcinolone cream (KENALOG) 0.1 %, Apply 1 Application topically 2 (two) times daily as needed (psoriasis)., Disp: , Rfl:    zinc gluconate 50 MG tablet, Take 50 mg by mouth in the morning., Disp: , Rfl:   Past Medical History: Past Medical History:  Diagnosis Date   Arthritis    Bowel obstruction ([place])    CKD (chronic kidney disease)    unkknown stage per patient   Coronary artery disease    Diabetes mellitus ([place])    Hyperlipidemia    Hypertension    Kidney stones     Tobacco Use: Social History   Tobacco Use  Smoking Status Former  Smokeless Tobacco Never  Tobacco Comments   quit over 40 years ago.Around age [age]     Labs: Review Flowsheet  Latest Ref Rng & Units 09/28/2017 05/01/2021 05/02/2021  Labs for ITP Cardiac and Pulmonary Rehab  Cholestrol 0 - 200 mg/dL - - 97   LDL (calc) 0 - 99 mg/dL - - 55   HDL-C >40 mg/dL - - 25   Trlycerides <150 mg/dL - - 84   Hemoglobin A1c 4.8 - 5.6 % 7.0  6.6  -    Capillary Blood Glucose: Lab Results  Component Value Date   GLUCAP 115 (H) 03/16/2022   GLUCAP 138 (H) 03/16/2022   GLUCAP 122 (H) 03/14/2022   GLUCAP 129 (H) 03/14/2022   GLUCAP 157 (H) 03/10/2022     Exercise Target Goals: Exercise Program Goal: Individual exercise prescription set using results from initial 6 min walk test and THRR while considering  patient's activity barriers and safety.   Exercise Prescription Goal: Initial exercise prescription builds to 30-45 minutes a day of aerobic activity, 2-3 days per week.   Home exercise guidelines will be given to patient during program as part of exercise prescription that the participant will acknowledge.  Activity Barriers & Risk Stratification:  Activity Barriers & Cardiac Risk Stratification - 03/10/22 1214       Activity Barriers & Cardiac Risk Stratification   Activity Barriers Arthritis;Back Problems;Right Hip Replacement;Neck/Spine Problems;Joint Problems;Balance Concerns    Cardiac Risk Stratification High             6 Minute Walk:  6 Minute Walk     Row Name 03/10/22 1103         6 Minute Walk   Phase Initial     Distance 1531 feet     Walk Time 6 minutes     # of Rest Breaks 0     MPH 2.9     METS 3.39     RPE 12     Perceived Dyspnea  0     VO2 Peak 11.9     Symptoms No     Resting HR 69 bpm     Resting BP 128/56     Resting Oxygen Saturation  96 %     Exercise Oxygen Saturation  during 6 min walk 99 %     Max Ex. HR 104 bpm     Max Ex. BP 158/54     2 Minute Post BP 138/56              Oxygen Initial Assessment:   Oxygen Re-Evaluation:   Oxygen Discharge (Final Oxygen Re-Evaluation):   Initial Exercise Prescription:  Initial Exercise Prescription - 03/10/22 1200       Date of Initial Exercise RX and Referring Provider   Date 03/10/22    Referring Provider Candee Furbish, MD    Expected Discharge Date 05/06/22      Recumbant Bike   Level 2    RPM 60    Watts 35    Minutes 15    METs 3.3      NuStep   Level 2    SPM 75    Minutes 15    METs 3.3      Prescription Details   Frequency (times per week) 3    Duration Progress to 30 minutes of continuous aerobic without signs/symptoms of physical distress      Intensity   THRR 40-80% of Max Heartrate 59-118    Ratings of Perceived Exertion 11-13    Perceived Dyspnea 0-4      Progression   Progression Continue progressive overload as per policy without  signs/symptoms or physical distress.      Resistance Training   Training Prescription Yes     Weight 3 lbs    Reps 10-15             Perform Capillary Blood Glucose checks as needed.  Exercise Prescription Changes:   Exercise Prescription Changes     Row Name 03/14/22 1400             Response to Exercise   Blood Pressure (Admit) 120/60       Blood Pressure (Exercise) 138/60       Blood Pressure (Exit) 118/72       Heart Rate (Admit) 67 bpm       Heart Rate (Exercise) 91 bpm       Heart Rate (Exit) 71 bpm       Rating of Perceived Exertion (Exercise) 11       Symptoms None       Comments Pt's first day in the CRP2 program       Duration Continue with 30 min of aerobic exercise without signs/symptoms of physical distress.       Intensity THRR unchanged         Progression   Progression Continue to progress workloads to maintain intensity without signs/symptoms of physical distress.       Average METs 2.35         Resistance Training   Training Prescription Yes       Weight 3 lbs       Reps 10-15       Time 10 Minutes         Interval Training   Interval Training No         Recumbant Bike   Level 2       Minutes 15       METs 2.8         T5 Nustep   Level 3       SPM 80       Minutes 15       METs 2.8                Exercise Comments:   Exercise Comments     Row Name 03/14/22 1426           Exercise Comments Pt's first day in the CPR2 program. Pt had no complaints with todays session and is off to a good start.                Exercise Goals and Review:   Exercise Goals     Row Name 03/10/22 1216             Exercise Goals   Increase Physical Activity Yes       Intervention Provide advice, education, support and counseling about physical activity/exercise needs.;Develop an individualized exercise prescription for aerobic and resistive training based on initial evaluation findings, risk stratification, comorbidities and participant's personal goals.       Expected Outcomes Short Term: Attend rehab on a regular basis  to increase amount of physical activity.;Long Term: Add in home exercise to make exercise part of routine and to increase amount of physical activity.;Long Term: Exercising regularly at least 3-5 days a week.       Increase Strength and Stamina Yes       Intervention Provide advice, education, support and counseling about physical activity/exercise needs.;Develop an individualized exercise prescription for aerobic and resistive training based on initial evaluation findings,  risk stratification, comorbidities and participant's personal goals.       Expected Outcomes Short Term: Increase workloads from initial exercise prescription for resistance, speed, and METs.;Short Term: Perform resistance training exercises routinely during rehab and add in resistance training at home;Long Term: Improve cardiorespiratory fitness, muscular endurance and strength as measured by increased METs and functional capacity (6MWT)       Able to understand and use rate of perceived exertion (RPE) scale Yes       Intervention Provide education and explanation on how to use RPE scale       Expected Outcomes Short Term: Able to use RPE daily in rehab to express subjective intensity level;Long Term:  Able to use RPE to guide intensity level when exercising independently       Knowledge and understanding of Target Heart Rate Range (THRR) Yes       Intervention Provide education and explanation of THRR including how the numbers were predicted and where they are located for reference       Expected Outcomes Short Term: Able to state/look up THRR;Short Term: Able to use daily as guideline for intensity in rehab;Long Term: Able to use THRR to govern intensity when exercising independently       Understanding of Exercise Prescription Yes       Intervention Provide education, explanation, and written materials on patient's individual exercise prescription       Expected Outcomes Short Term: Able to explain program exercise  prescription;Long Term: Able to explain home exercise prescription to exercise independently                Exercise Goals Re-Evaluation :  Exercise Goals Re-Evaluation     Row Name 03/14/22 1425             Exercise Goal Re-Evaluation   Exercise Goals Review Increase Physical Activity;Increase Strength and Stamina;Able to understand and use rate of perceived exertion (RPE) scale;Knowledge and understanding of Target Heart Rate Range (THRR);Understanding of Exercise Prescription       Comments Pt's first day in the CRP2 program. Pt understands the exercise Rx, RPE scale and THRR       Expected Outcomes Will cotniue to monitor pt and progress exercise workloads as tolerated.                Discharge Exercise Prescription (Final Exercise Prescription Changes):  Exercise Prescription Changes - 03/14/22 1400       Response to Exercise   Blood Pressure (Admit) 120/60    Blood Pressure (Exercise) 138/60    Blood Pressure (Exit) 118/72    Heart Rate (Admit) 67 bpm    Heart Rate (Exercise) 91 bpm    Heart Rate (Exit) 71 bpm    Rating of Perceived Exertion (Exercise) 11    Symptoms None    Comments Pt's first day in the CRP2 program    Duration Continue with 30 min of aerobic exercise without signs/symptoms of physical distress.    Intensity THRR unchanged      Progression   Progression Continue to progress workloads to maintain intensity without signs/symptoms of physical distress.    Average METs 2.35      Resistance Training   Training Prescription Yes    Weight 3 lbs    Reps 10-15    Time 10 Minutes      Interval Training   Interval Training No      Recumbant Bike   Level 2    Minutes 15  METs 2.8      T5 Nustep   Level 3    SPM 80    Minutes 15    METs 2.8             Nutrition:  Target Goals: Understanding of nutrition guidelines, daily intake of sodium '1500mg'$ , cholesterol '200mg'$ , calories 30% from fat and 7% or less from saturated fats,  daily to have 5 or more servings of fruits and vegetables.  Biometrics:  Pre Biometrics - 03/10/22 1015       Pre Biometrics   Waist Circumference 41 inches    Hip Circumference 41 inches    Waist to Hip Ratio 1 %    Triceps Skinfold 13 mm    % Body Fat 27.6 %    Grip Strength 40 kg    Flexibility --   Not performed due to back problems   Single Leg Stand 10.2 seconds              Nutrition Therapy Plan and Nutrition Goals:  Nutrition Therapy & Goals - 03/14/22 1355       Nutrition Therapy   Diet Heart Healthy/Carbohydrate Consistent    Drug/Food Interactions Statins/Certain Fruits    Protein (specify units) 52-69g (0.6-0.8g/kg actual body weight)      Personal Nutrition Goals   Nutrition Goal Patient to choose a variety of fruits, vegetables, whole grains, lean protein/plant protein, nonfat dairy daily as part of heart healthy diet    Personal Goal #2 Patient to limit sodium intake to '1500mg'$  per day    Personal Goal #3 Patient to identify and limit daily intake of saturated fat, trans fat, sodium, and refined carbohydrates    Comments Patient reports he has previously seen a dietitian related to diabetes diagnosis. Diagnosis of CKD4. Most recent labs GFR 27, potassium 4.9, Cr. 2.4, A1c 7.4, BUN 32.      Intervention Plan   Intervention Prescribe, educate and counsel regarding individualized specific dietary modifications aiming towards targeted core components such as weight, hypertension, lipid management, diabetes, heart failure and other comorbidities.    Expected Outcomes Short Term Goal: Understand basic principles of dietary content, such as calories, fat, sodium, cholesterol and nutrients.;Long Term Goal: Adherence to prescribed nutrition plan.             Nutrition Assessments:  Nutrition Assessments - 03/14/22 1413       Rate Your Plate Scores   Pre Score 41            MEDIFICTS Score Key: ?70 Need to make dietary changes  40-70 Heart Healthy  Diet ? 40 Therapeutic Level Cholesterol Diet   Flowsheet Row INTENSIVE CARDIAC REHAB from 03/14/2022 in [facility]  Picture Your Plate Total Score on Admission 41      Picture Your Plate Scores: <67 Unhealthy dietary pattern with much room for improvement. 41-50 Dietary pattern unlikely to meet recommendations for good health and room for improvement. 51-60 More healthful dietary pattern, with some room for improvement.  >60 Healthy dietary pattern, although there may be some specific behaviors that could be improved.    Nutrition Goals Re-Evaluation:  Nutrition Goals Re-Evaluation     [place] Name 03/14/22 1355             Goals   Current Weight 191 lb 5.8 oz (86.8 kg)       Comment Diagnosis of CKD4. Most recent labs GFR 27, potassium 4.9, Cr. 2.4, A1c 7.4, BUN 32  Expected Outcome Patient reports he has previously seen a dietitian related to diabetes diagnosis; he reports uncertainty of what to eat. PYP score reflect low nutrition knowledge.                Nutrition Goals Re-Evaluation:  Nutrition Goals Re-Evaluation     [place] Name 03/14/22 1355             Goals   Current Weight 191 lb 5.8 oz (86.8 kg)       Comment Diagnosis of CKD4. Most recent labs GFR 27, potassium 4.9, Cr. 2.4, A1c 7.4, BUN 32       Expected Outcome Patient reports he has previously seen a dietitian related to diabetes diagnosis; he reports uncertainty of what to eat. PYP score reflect low nutrition knowledge.                Nutrition Goals Discharge (Final Nutrition Goals Re-Evaluation):  Nutrition Goals Re-Evaluation - 03/14/22 1355       Goals   Current Weight 191 lb 5.8 oz (86.8 kg)    Comment Diagnosis of CKD4. Most recent labs GFR 27, potassium 4.9, Cr. 2.4, A1c 7.4, BUN 32    Expected Outcome Patient reports he has previously seen a dietitian related to diabetes diagnosis; he reports uncertainty of what to eat. PYP score reflect low nutrition  knowledge.             Psychosocial: Target Goals: Acknowledge presence or absence of significant depression and/or stress, maximize coping skills, provide positive support system. Participant is able to verbalize types and ability to use techniques and skills needed for reducing stress and depression.  Initial Review & Psychosocial Screening:  Initial Psych Review & Screening - 03/10/22 1527       Initial Review   Current issues with Current Stress Concerns    Source of Stress Concerns Chronic Illness;Unable to participate in former interests or hobbies;Unable to perform yard/household activities      [place]? Yes   Liliane Channel has his wife for support     Barriers   Psychosocial barriers to participate in program The patient should benefit from training in stress management and relaxation.      Screening Interventions   Interventions Encouraged to exercise;To provide support and resources with identified psychosocial needs;Provide feedback about the scores to participant    Expected Outcomes Short Term goal: Identification and review with participant of any Quality of Life or Depression concerns found by scoring the questionnaire.;Long Term Goal: Stressors or current issues are controlled or eliminated.;Long Term goal: The participant improves quality of Life and PHQ9 Scores as seen by post scores and/or verbalization of changes             Quality of Life Scores:  Quality of Life - 03/10/22 1225       Quality of Life   Select Quality of Life      Quality of Life Scores   Health/Function Pre 16.77 %    Socioeconomic Pre 22.13 %    Psych/Spiritual Pre 18.86 %    Family Pre 22.8 %    GLOBAL Pre 19.27 %            Scores of 19 and below usually indicate a poorer quality of life in these areas.  A difference of  2-3 points is a clinically meaningful difference.  A difference of 2-3 points in the total score of the Quality of Life Index has  been  associated with significant improvement in overall quality of life, self-image, physical symptoms, and general health in studies assessing change in quality of life.  PHQ-9: Review Flowsheet       03/10/2022 09/03/2019 12/07/2018  Depression screen PHQ 2/9  Decreased Interest 0 0 0  Down, Depressed, Hopeless 0 0 0  PHQ - 2 Score 0 0 0   Interpretation of Total Score  Total Score Depression Severity:  1-4 = Minimal depression, 5-9 = Mild depression, 10-14 = Moderate depression, 15-19 = Moderately severe depression, 20-27 = Severe depression   Psychosocial Evaluation and Intervention:   Psychosocial Re-Evaluation:  Psychosocial Re-Evaluation     [place] Name 03/15/22 0931             Psychosocial Re-Evaluation   Current issues with Current Stress Concerns       Comments Liliane Channel did not voice any increased stressors on his first day of exercise at [employer]. Will review quality of life in the next week       Expected Outcomes Liliane Channel will have decreased stress upon completion of phase 2 cardiac rehab.       Interventions Stress management education;Relaxation education;Encouraged to attend Cardiac Rehabilitation for the exercise       Continue Psychosocial Services  Follow up required by staff         Initial Review   Source of Stress Concerns Chronic Illness;Unable to perform yard/household activities;Unable to participate in former interests or hobbies       Comments Will continue to monitor and offer support as needed                Psychosocial Discharge (Final Psychosocial Re-Evaluation):  Psychosocial Re-Evaluation - 03/15/22 0931       Psychosocial Re-Evaluation   Current issues with Current Stress Concerns    Comments Liliane Channel did not voice any increased stressors on his first day of exercise at [employer]. Will review quality of life in the next week    Expected Outcomes Liliane Channel will have decreased stress upon completion of phase 2 cardiac rehab.    Interventions Stress management  education;Relaxation education;Encouraged to attend Cardiac Rehabilitation for the exercise    Continue Psychosocial Services  Follow up required by staff      Initial Review   Source of Stress Concerns Chronic Illness;Unable to perform yard/household activities;Unable to participate in former interests or hobbies    Comments Will continue to monitor and offer support as needed             Vocational Rehabilitation: Provide vocational rehab assistance to qualifying candidates.   Vocational Rehab Evaluation & Intervention:  Vocational Rehab - 03/10/22 1530       Initial Vocational Rehab Evaluation & Intervention   Assessment shows need for Vocational Rehabilitation No   Liliane Channel is retired and does not need vocational rehab at this time            Education: Education Goals: Education classes will be provided on a weekly basis, covering required topics. Participant will state understanding/return demonstration of topics presented.    Education - 03/14/22 1500       Education   Cardiac Education Topics [place]    Musician Exercise    Exercise Workshop Hotel manager and Fall Prevention    Instruction Review Code 1- Verbalizes Understanding    Class Start Time 1355    Class Stop Time 2706  Class Time Calculation (min) 49 min             Core Videos: Exercise    Move It!  Clinical staff conducted group or individual video education with verbal and written material and guidebook.  Patient learns the recommended Pritikin exercise program. Exercise with the goal of living a long, healthy life. Some of the health benefits of exercise include controlled diabetes, healthier blood pressure levels, improved cholesterol levels, improved heart and lung capacity, improved sleep, and better body composition. Everyone should speak with their doctor before starting or changing an exercise  routine.  Biomechanical Limitations Clinical staff conducted group or individual video education with verbal and written material and guidebook.  Patient learns how biomechanical limitations can impact exercise and how we can mitigate and possibly overcome limitations to have an impactful and balanced exercise routine.  Body Composition Clinical staff conducted group or individual video education with verbal and written material and guidebook.  Patient learns that body composition (ratio of muscle mass to fat mass) is a key component to assessing overall fitness, rather than body weight alone. Increased fat mass, especially visceral belly fat, can put [place] at increased risk for metabolic syndrome, type 2 diabetes, heart disease, and even death. It is recommended to combine diet and exercise (cardiovascular and resistance training) to improve your body composition. Seek guidance from your physician and exercise physiologist before implementing an exercise routine.  Exercise Action Plan Clinical staff conducted group or individual video education with verbal and written material and guidebook.  Patient learns the recommended strategies to achieve and enjoy long-term exercise adherence, including variety, self-motivation, self-efficacy, and positive decision making. Benefits of exercise include fitness, good health, weight management, more energy, better sleep, less stress, and overall well-being.  Medical   Heart Disease Risk Reduction Clinical staff conducted group or individual video education with verbal and written material and guidebook.  Patient learns our heart is our most vital organ as it circulates oxygen, nutrients, white blood cells, and hormones throughout the entire body, and carries waste away. Data supports a plant-based eating plan like the Pritikin Program for its effectiveness in slowing progression of and reversing heart disease. The video provides a number of recommendations to  address heart disease.   Metabolic Syndrome and Belly Fat  Clinical staff conducted group or individual video education with verbal and written material and guidebook.  Patient learns what metabolic syndrome is, how it leads to heart disease, and how one can reverse it and keep it from coming back. You have metabolic syndrome if you have 3 of the following 5 criteria: abdominal obesity, high blood pressure, high triglycerides, low HDL cholesterol, and high blood sugar.  Hypertension and Heart Disease Clinical staff conducted group or individual video education with verbal and written material and guidebook.  Patient learns that high blood pressure, or hypertension, is very common in the [place]. Hypertension is largely due to excessive salt intake, but other important risk factors include being overweight, physical inactivity, drinking too much alcohol, smoking, and not eating enough potassium from fruits and vegetables. High blood pressure is a leading risk factor for heart attack, stroke, congestive heart failure, dementia, kidney failure, and premature death. Long-term effects of excessive salt intake include stiffening of the arteries and thickening of heart muscle and organ damage. Recommendations include ways to reduce hypertension and the risk of heart disease.  Diseases of Our Time - Focusing on Diabetes Clinical staff conducted group or individual video education with verbal  and written material and guidebook.  Patient learns why the best way to stop diseases of our time is prevention, through food and other lifestyle changes. Medicine (such as prescription pills and surgeries) is often only a Band-Aid on the problem, not a long-term solution. Most common diseases of our time include obesity, type 2 diabetes, hypertension, heart disease, and cancer. The Pritikin Program is recommended and has been proven to help reduce, reverse, and/or prevent the damaging effects of metabolic  syndrome.  Nutrition   Overview of the Pritikin Eating Plan  Clinical staff conducted group or individual video education with verbal and written material and guidebook.  Patient learns about the [place] for disease risk reduction. The [place] emphasizes a wide variety of unrefined, minimally-processed carbohydrates, like fruits, vegetables, whole grains, and legumes. Go, Caution, and Stop food choices are explained. Plant-based and lean animal proteins are emphasized. Rationale provided for low sodium intake for blood pressure control, low added sugars for blood sugar stabilization, and low added fats and oils for coronary artery disease risk reduction and weight management.  Calorie Density  Clinical staff conducted group or individual video education with verbal and written material and guidebook.  Patient learns about calorie density and how it impacts the Pritikin Eating Plan. Knowing the characteristics of the food you choose will help you decide whether those foods will lead to weight gain or weight loss, and whether you want to consume more or less of them. Weight loss is usually a side effect of the Pritikin Eating Plan because of its focus on low calorie-dense foods.  Label Reading  Clinical staff conducted group or individual video education with verbal and written material and guidebook.  Patient learns about the Pritikin recommended label reading guidelines and corresponding recommendations regarding calorie density, added sugars, sodium content, and whole grains.  Dining Out - Part 1  Clinical staff conducted group or individual video education with verbal and written material and guidebook.  Patient learns that restaurant meals can be sabotaging because they can be so high in calories, fat, sodium, and/or sugar. Patient learns recommended strategies on how to positively address this and avoid unhealthy pitfalls.  Facts on Fats  Clinical staff conducted  group or individual video education with verbal and written material and guidebook.  Patient learns that lifestyle modifications can be just as effective, if not more so, as many medications for lowering your risk of heart disease. A Pritikin lifestyle can help to reduce your risk of inflammation and atherosclerosis (cholesterol build-up, or plaque, in the artery walls). Lifestyle interventions such as dietary choices and physical activity address the cause of atherosclerosis. A review of the types of fats and their impact on blood cholesterol levels, along with dietary recommendations to reduce fat intake is also included.  Nutrition Action Plan  Clinical staff conducted group or individual video education with verbal and written material and guidebook.  Patient learns how to incorporate Pritikin recommendations into their lifestyle. Recommendations include planning and keeping personal health goals in mind as an important part of their success.  Healthy Mind-Set    Healthy Minds, Bodies, Hearts  Clinical staff conducted group or individual video education with verbal and written material and guidebook.  Patient learns how to identify when they are stressed. Video will discuss the impact of that stress, as well as the many benefits of stress management. Patient will also be introduced to stress management techniques. The way we think, act, and feel has an impact on  our hearts.  How Our Thoughts Can Heal Our Hearts  Clinical staff conducted group or individual video education with verbal and written material and guidebook.  Patient learns that negative thoughts can cause depression and anxiety. This can result in negative lifestyle behavior and serious health problems. Cognitive behavioral therapy is an effective method to help control our thoughts in order to change and improve our emotional outlook.  Additional Videos:  Exercise    Improving Performance  Clinical staff conducted group or  individual video education with verbal and written material and guidebook.  Patient learns to use a non-linear approach by alternating intensity levels and lengths of time spent exercising to help burn more calories and lose more body fat. Cardiovascular exercise helps improve heart health, metabolism, hormonal balance, blood sugar control, and recovery from fatigue. Resistance training improves strength, endurance, balance, coordination, reaction time, metabolism, and muscle mass. Flexibility exercise improves circulation, posture, and balance. Seek guidance from your physician and exercise physiologist before implementing an exercise routine and learn your capabilities and proper form for all exercise.  Introduction to Yoga  Clinical staff conducted group or individual video education with verbal and written material and guidebook.  Patient learns about yoga, a discipline of the coming together of mind, breath, and body. The benefits of yoga include improved flexibility, improved range of motion, better posture and core strength, increased lung function, weight loss, and positive self-image. Yoga's heart health benefits include lowered blood pressure, healthier heart rate, decreased cholesterol and triglyceride levels, improved immune function, and reduced stress. Seek guidance from your physician and exercise physiologist before implementing an exercise routine and learn your capabilities and proper form for all exercise.  Medical   Aging: Enhancing Your Quality of Life  Clinical staff conducted group or individual video education with verbal and written material and guidebook.  Patient learns key strategies and recommendations to stay in good physical health and enhance quality of life, such as prevention strategies, having an advocate, securing a [place], and keeping a list of medications and system for tracking them. It also discusses how to avoid risk for bone  loss.  Biology of Weight Control  Clinical staff conducted group or individual video education with verbal and written material and guidebook.  Patient learns that weight gain occurs because we consume more calories than we burn (eating more, moving less). Even if your body weight is normal, you may have higher ratios of fat compared to muscle mass. Too much body fat puts you at increased risk for cardiovascular disease, heart attack, stroke, type 2 diabetes, and obesity-related cancers. In addition to exercise, following the [place] can help reduce your risk.  Decoding Lab Results  Clinical staff conducted group or individual video education with verbal and written material and guidebook.  Patient learns that lab test reflects one measurement whose values change over time and are influenced by many factors, including medication, stress, sleep, exercise, food, hydration, pre-existing medical conditions, and more. It is recommended to use the knowledge from this video to become more involved with your lab results and evaluate your numbers to speak with your doctor.   Diseases of Our Time - Overview  Clinical staff conducted group or individual video education with verbal and written material and guidebook.  Patient learns that according to the CDC, 50% to 70% of chronic diseases (such as obesity, type 2 diabetes, elevated lipids, hypertension, and heart disease) are avoidable through lifestyle improvements including healthier food  choices, listening to satiety cues, and increased physical activity.  Sleep Disorders Clinical staff conducted group or individual video education with verbal and written material and guidebook.  Patient learns how good quality and duration of sleep are important to overall health and well-being. Patient also learns about sleep disorders and how they impact health along with recommendations to address them, including discussing with a physician.  Nutrition   Dining Out - Part 2 Clinical staff conducted group or individual video education with verbal and written material and guidebook.  Patient learns how to plan ahead and communicate in order to maximize their dining experience in a healthy and nutritious manner. Included are recommended food choices based on the type of restaurant the patient is visiting.   Fueling a Best boy conducted group or individual video education with verbal and written material and guidebook.  There is a strong connection between our food choices and our health. Diseases like obesity and type 2 diabetes are very prevalent and are in large-part due to lifestyle choices. The Pritikin Eating Plan provides plenty of food and hunger-curbing satisfaction. It is easy to follow, affordable, and helps reduce health risks.  Menu Workshop  Clinical staff conducted group or individual video education with verbal and written material and guidebook.  Patient learns that restaurant meals can sabotage health goals because they are often packed with calories, fat, sodium, and sugar. Recommendations include strategies to plan ahead and to communicate with the manager, chef, or server to help order a healthier meal.  Planning Your Eating Strategy  Clinical staff conducted group or individual video education with verbal and written material and guidebook.  Patient learns about the [place] and its benefit of reducing the risk of disease. The [place] does not focus on calories. Instead, it emphasizes high-quality, nutrient-rich foods. By knowing the characteristics of the foods, we choose, we can determine their calorie density and make informed decisions.  Targeting Your Nutrition Priorities  Clinical staff conducted group or individual video education with verbal and written material and guidebook.  Patient learns that lifestyle habits have a tremendous impact on disease risk and progression. This  video provides eating and physical activity recommendations based on your personal health goals, such as reducing LDL cholesterol, losing weight, preventing or controlling type 2 diabetes, and reducing high blood pressure.  Vitamins and Minerals  Clinical staff conducted group or individual video education with verbal and written material and guidebook.  Patient learns different ways to obtain key vitamins and minerals, including through a recommended healthy diet. It is important to discuss all supplements you take with your doctor.   Healthy Mind-Set    Smoking Cessation  Clinical staff conducted group or individual video education with verbal and written material and guidebook.  Patient learns that cigarette smoking and tobacco addiction pose a serious health risk which affects millions of people. Stopping smoking will significantly reduce the risk of heart disease, lung disease, and many forms of cancer. Recommended strategies for quitting are covered, including working with your doctor to develop a successful plan.  Culinary   Becoming a Financial trader conducted group or individual video education with verbal and written material and guidebook.  Patient learns that cooking at home can be healthy, cost-effective, quick, and puts them in control. Keys to cooking healthy recipes will include looking at your recipe, assessing your equipment needs, planning ahead, making it simple, choosing cost-effective seasonal ingredients, and limiting the use of  added fats, salts, and sugars.  Cooking - Breakfast and Snacks  Clinical staff conducted group or individual video education with verbal and written material and guidebook.  Patient learns how important breakfast is to satiety and nutrition through the entire day. Recommendations include key foods to eat during breakfast to help stabilize blood sugar levels and to prevent overeating at meals later in the day. Planning ahead is also a  key component.  Cooking - Human resources officer conducted group or individual video education with verbal and written material and guidebook.  Patient learns eating strategies to improve overall health, including an approach to cook more at home. Recommendations include thinking of animal protein as a side on your plate rather than center stage and focusing instead on lower calorie dense options like vegetables, fruits, whole grains, and plant-based proteins, such as beans. Making sauces in large quantities to freeze for later and leaving the skin on your vegetables are also recommended to maximize your experience.  Cooking - Healthy Salads and Dressing Clinical staff conducted group or individual video education with verbal and written material and guidebook.  Patient learns that vegetables, fruits, whole grains, and legumes are the foundations of the [place]. Recommendations include how to incorporate each of these in flavorful and healthy salads, and how to create homemade salad dressings. Proper handling of ingredients is also covered. Cooking - Soups and [employer] - Soups and Desserts Clinical staff conducted group or individual video education with verbal and written material and guidebook.  Patient learns that Pritikin soups and desserts make for easy, nutritious, and delicious snacks and meal components that are low in sodium, fat, sugar, and calorie density, while high in vitamins, minerals, and filling fiber. Recommendations include simple and healthy ideas for soups and desserts.   Overview     The Pritikin Solution Program Overview Clinical staff conducted group or individual video education with verbal and written material and guidebook.  Patient learns that the results of the [place] Program have been documented in more than 100 articles published in peer-reviewed journals, and the benefits include reducing risk factors for (and, in some cases, even  reversing) high cholesterol, high blood pressure, type 2 diabetes, obesity, and more! An overview of the three key pillars of the Pritikin Program will be covered: eating well, doing regular exercise, and having a healthy mind-set.  WORKSHOPS  Exercise: Exercise Basics: Building Your Action Plan Clinical staff led group instruction and group discussion with PowerPoint presentation and patient guidebook. To enhance the learning environment the use of posters, models and videos may be added. At the conclusion of this workshop, patients will comprehend the difference between physical activity and exercise, as well as the benefits of incorporating both, into their routine. Patients will understand the FITT (Frequency, Intensity, Time, and Type) principle and how to use it to build an exercise action plan. In addition, safety concerns and other considerations for exercise and cardiac rehab will be addressed by the presenter. The purpose of this lesson is to promote a comprehensive and effective weekly exercise routine in order to improve patients' overall level of fitness.   Managing Heart Disease: Your Path to a Healthier Heart Clinical staff led group instruction and group discussion with PowerPoint presentation and patient guidebook. To enhance the learning environment the use of posters, models and videos may be added.At the conclusion of this workshop, patients will understand the anatomy and physiology of the heart. Additionally, they will understand how Pritikin's  three pillars impact the risk factors, the progression, and the management of heart disease.  The purpose of this lesson is to provide a high-level overview of the heart, heart disease, and how the Pritikin lifestyle positively impacts risk factors.  Exercise Biomechanics Clinical staff led group instruction and group discussion with PowerPoint presentation and patient guidebook. To enhance the learning environment the use of  posters, models and videos may be added. Patients will learn how the structural parts of their bodies function and how these functions impact their daily activities, movement, and exercise. Patients will learn how to promote a neutral spine, learn how to manage pain, and identify ways to improve their physical movement in order to promote healthy living. The purpose of this lesson is to expose patients to common physical limitations that impact physical activity. Participants will learn practical ways to adapt and manage aches and pains, and to minimize their effect on regular exercise. Patients will learn how to maintain good posture while sitting, walking, and lifting.  Balance Training and Fall Prevention  Clinical staff led group instruction and group discussion with PowerPoint presentation and patient guidebook. To enhance the learning environment the use of posters, models and videos may be added. At the conclusion of this workshop, patients will understand the importance of their sensorimotor skills (vision, proprioception, and the vestibular system) in maintaining their ability to balance as they age. Patients will apply a variety of balancing exercises that are appropriate for their current level of function. Patients will understand the common causes for poor balance, possible solutions to these problems, and ways to modify their physical environment in order to minimize their fall risk. The purpose of this lesson is to teach patients about the importance of maintaining balance as they age and ways to minimize their risk of falling.  WORKSHOPS   Nutrition:  Fueling a Scientist, research (physical sciences) led group instruction and group discussion with PowerPoint presentation and patient guidebook. To enhance the learning environment the use of posters, models and videos may be added. Patients will review the foundational principles of the [place] and understand what constitutes a  serving size in each of the food groups. Patients will also learn Pritikin-friendly foods that are better choices when away from home and review make-ahead meal and snack options. Calorie density will be reviewed and applied to three nutrition priorities: weight maintenance, weight loss, and weight gain. The purpose of this lesson is to reinforce (in a group setting) the key concepts around what patients are recommended to eat and how to apply these guidelines when away from home by planning and selecting Pritikin-friendly options. Patients will understand how calorie density may be adjusted for different weight management goals.  Mindful Eating  Clinical staff led group instruction and group discussion with PowerPoint presentation and patient guidebook. To enhance the learning environment the use of posters, models and videos may be added. Patients will briefly review the concepts of the [place] and the importance of low-calorie dense foods. The concept of mindful eating will be introduced as well as the importance of paying attention to internal hunger signals. Triggers for non-hunger eating and techniques for dealing with triggers will be explored. The purpose of this lesson is to provide patients with the opportunity to review the basic principles of the [place], discuss the value of eating mindfully and how to measure internal cues of hunger and fullness using the Hunger Scale. Patients will also discuss reasons for non-hunger eating and  learn strategies to use for controlling emotional eating.  Targeting Your Nutrition Priorities Clinical staff led group instruction and group discussion with PowerPoint presentation and patient guidebook. To enhance the learning environment the use of posters, models and videos may be added. Patients will learn how to determine their genetic susceptibility to disease by reviewing their family history. Patients will gain insight into the  importance of diet as part of an overall healthy lifestyle in mitigating the impact of genetics and other environmental insults. The purpose of this lesson is to provide patients with the opportunity to assess their personal nutrition priorities by looking at their family history, their own health history and current risk factors. Patients will also be able to discuss ways of prioritizing and modifying the [place] for their highest risk areas  Menu  Clinical staff led group instruction and group discussion with PowerPoint presentation and patient guidebook. To enhance the learning environment the use of posters, models and videos may be added. Using menus brought in from [employer], or printed from [employer], patients will apply the [place] dining out guidelines that were presented in the [employer] video. Patients will also be able to practice these guidelines in a variety of provided scenarios. The purpose of this lesson is to provide patients with the opportunity to practice hands-on learning of the [place] with actual menus and practice scenarios.  Label Reading Clinical staff led group instruction and group discussion with PowerPoint presentation and patient guidebook. To enhance the learning environment the use of posters, models and videos may be added. Patients will review and discuss the Pritikin label reading guidelines presented in Pritikin's Label Reading Educational series video. Using fool labels brought in from local grocery stores and markets, patients will apply the label reading guidelines and determine if the packaged food meet the Pritikin guidelines. The purpose of this lesson is to provide patients with the opportunity to review, discuss, and practice hands-on learning of the Pritikin Label Reading guidelines with actual packaged food labels. [place] Workshops are designed to teach  patients ways to prepare quick, simple, and affordable recipes at home. The importance of nutrition's role in chronic disease risk reduction is reflected in its emphasis in the overall Pritikin program. By learning how to prepare essential core Pritikin Eating Plan recipes, patients will increase control over what they eat; be able to customize the flavor of foods without the use of added salt, sugar, or fat; and improve the quality of the food they consume. By learning a set of core recipes which are easily assembled, quickly prepared, and affordable, patients are more likely to prepare more healthy foods at home. These workshops focus on convenient breakfasts, simple entres, side dishes, and desserts which can be prepared with minimal effort and are consistent with nutrition recommendations for cardiovascular risk reduction. Cooking [employer] are taught by a Engineer, materials (RD) who has been trained by the [employer]. The chef or RD has a clear understanding of the importance of minimizing - if not completely eliminating - added fat, sugar, and sodium in recipes. Throughout the series of [place] Workshop sessions, patients will learn about healthy ingredients and efficient methods of cooking to build confidence in their capability to prepare    Cooking School weekly topics:  Adding Flavor- Sodium-Free  Fast and Healthy Breakfasts  Powerhouse Plant-Based Proteins  Satisfying Salads and Dressings  Simple Sides and Sauces  International Cuisine-Spotlight on the [place]  [employer] - Meals in a Snap  Tasty Appetizers and Snacks  Comforting Weekend Breakfasts  One-Pot Wonders   Fast [employer] Your Pritikin Plate  WORKSHOPS   Healthy Mindset (Psychosocial): New Thoughts, New Behaviors Clinical staff led group instruction and group discussion with PowerPoint presentation  and patient guidebook. To enhance the learning environment the use of posters, models and videos may be added. Patients will learn and practice techniques for developing effective health and lifestyle goals. Patients will be able to effectively apply the goal setting process learned to develop at least one new personal goal.  The purpose of this lesson is to expose patients to a new skill set of behavior modification techniques such as techniques setting SMART goals, overcoming barriers, and achieving new thoughts and new behaviors.  Managing Moods and Relationships Clinical staff led group instruction and group discussion with PowerPoint presentation and patient guidebook. To enhance the learning environment the use of posters, models and videos may be added. Patients will learn how emotional and chronic stress factors can impact their health and relationships. They will learn healthy ways to manage their moods and utilize positive coping mechanisms. In addition, [facility] patients will learn ways to improve communication skills. The purpose of this lesson is to expose patients to ways of understanding how one's mood and health are intimately connected. Developing a healthy outlook can help build positive relationships and connections with others. Patients will understand the importance of utilizing effective communication skills that include actively listening and being heard. They will learn and understand the importance of the "4 Cs" and especially Connections in fostering of a Healthy Mind-Set.  Healthy Sleep for a Healthy Heart Clinical staff led group instruction and group discussion with PowerPoint presentation and patient guidebook. To enhance the learning environment the use of posters, models and videos may be added. At the conclusion of this workshop, patients will be able to demonstrate knowledge of the importance of sleep to overall health, well-being, and quality of life. They will understand the  symptoms of, and treatments for, common sleep disorders. Patients will also be able to identify daytime and nighttime behaviors which impact sleep, and they will be able to apply these tools to help manage sleep-related challenges. The purpose of this lesson is to provide patients with a general overview of sleep and outline the importance of quality sleep. Patients will learn about a few of the most common sleep disorders. Patients will also be introduced to the concept of "sleep hygiene," and discover ways to self-manage certain sleeping problems through simple daily behavior changes. Finally, the workshop will motivate patients by clarifying the links between quality sleep and their goals of heart-healthy living.   Recognizing and Reducing Stress Clinical staff led group instruction and group discussion with PowerPoint presentation and patient guidebook. To enhance the learning environment the use of posters, models and videos may be added. At the conclusion of this workshop, patients will be able to understand the types of stress reactions, differentiate between acute and chronic stress, and recognize the impact that chronic stress has on their health. They will also be able to apply different coping mechanisms, such as reframing negative self-talk. Patients will have the opportunity to practice a variety of stress management techniques, such as deep abdominal breathing, progressive muscle relaxation, and/or guided imagery.  The purpose of this lesson is to educate patients on the role of  stress in their lives and to provide healthy techniques for coping with it.  Learning Barriers/Preferences:  Learning Barriers/Preferences - 03/10/22 1226       Learning Barriers/Preferences   Learning Barriers Hearing;Sight   Wears glasses and hearing aids   Learning Preferences Computer/Internet;Pictoral;Skilled Demonstration             Education Topics:  Knowledge Questionnaire Score:  Knowledge  Questionnaire Score - 03/10/22 1228       Knowledge Questionnaire Score   Pre Score 21/24             Core Components/Risk Factors/Patient Goals at Admission:  Personal Goals and Risk Factors at Admission - 03/10/22 1228       Core Components/Risk Factors/Patient Goals on Admission    Weight Management Yes;Weight Loss    Intervention Weight Management: Develop a combined nutrition and exercise program designed to reach desired caloric intake, while maintaining appropriate intake of nutrient and fiber, sodium and fats, and appropriate energy expenditure required for the weight goal.;Weight Management: Provide education and appropriate resources to help participant work on and attain dietary goals.;Weight Management/Obesity: Establish reasonable short term and long term weight goals.    Admit Weight 191 lb 5.8 oz (86.8 kg)    Expected Outcomes Short Term: Continue to assess and modify interventions until short term weight is achieved;Long Term: Adherence to nutrition and physical activity/exercise program aimed toward attainment of established weight goal;Weight Maintenance: Understanding of the daily nutrition guidelines, which includes 25-35% calories from fat, 7% or less cal from saturated fats, less than '200mg'$  cholesterol, less than 1.5gm of sodium, & 5 or more servings of fruits and vegetables daily;Weight Loss: Understanding of general recommendations for a balanced deficit meal plan, which promotes 1-2 lb weight loss per week and includes a negative energy balance of [phone] kcal/d;Understanding recommendations for meals to include 15-35% energy as protein, 25-35% energy from fat, 35-60% energy from carbohydrates, less than '200mg'$  of dietary cholesterol, 20-35 gm of total fiber daily;Understanding of distribution of calorie intake throughout the day with the consumption of 4-5 meals/snacks    Diabetes Yes    Intervention Provide education about signs/symptoms and action to take for  hypo/hyperglycemia.;Provide education about proper nutrition, including hydration, and aerobic/resistive exercise prescription along with prescribed medications to achieve blood glucose in normal ranges: Fasting glucose 65-99 mg/dL    Expected Outcomes Short Term: Participant verbalizes understanding of the signs/symptoms and immediate care of hyper/hypoglycemia, proper foot care and importance of medication, aerobic/resistive exercise and nutrition plan for blood glucose control.;Long Term: Attainment of HbA1C < 7%.    Hypertension Yes    Intervention Provide education on lifestyle modifcations including regular physical activity/exercise, weight management, moderate sodium restriction and increased consumption of fresh fruit, vegetables, and low fat dairy, alcohol moderation, and smoking cessation.;Monitor prescription use compliance.    Expected Outcomes Short Term: Continued assessment and intervention until BP is < 140/33m HG in hypertensive participants. < 130/864mHG in hypertensive participants with diabetes, heart failure or chronic kidney disease.;Long Term: Maintenance of blood pressure at goal levels.    Lipids Yes    Intervention Provide education and support for participant on nutrition & aerobic/resistive exercise along with prescribed medications to achieve LDL '70mg'$ , HDL >'40mg'$ .    Expected Outcomes Short Term: Participant states understanding of desired cholesterol values and is compliant with medications prescribed. Participant is following exercise prescription and nutrition guidelines.;Long Term: Cholesterol controlled with medications as prescribed, with individualized exercise RX and with personalized nutrition plan. Value goals:  LDL < '70mg'$ , HDL > 40 mg.    Stress Yes    Intervention Offer individual and/or small group education and counseling on adjustment to heart disease, stress management and health-related lifestyle change. Teach and support self-help strategies.;Refer  participants experiencing significant psychosocial distress to appropriate mental health specialists for further evaluation and treatment. When possible, include family members and significant others in education/counseling sessions.    Expected Outcomes Short Term: Participant demonstrates changes in health-related behavior, relaxation and other stress management skills, ability to obtain effective social support, and compliance with psychotropic medications if prescribed.;Long Term: Emotional wellbeing is indicated by absence of clinically significant psychosocial distress or social isolation.             Core Components/Risk Factors/Patient Goals Review:   Goals and Risk Factor Review     Row Name 03/15/22 0933             Core Components/Risk Factors/Patient Goals Review   Personal Goals Review Weight Management/Obesity;Hypertension;Lipids;Diabetes;Stress       Review Liliane Channel started  intensive cardiac rehab on 03/14/22. Rick did well with exercise. Vital signs and CBG's were stable       Expected Outcomes Liliane Channel will contiue to participate in phase 2 cardiac rehab for exercise, nutrition and lifestyle modifications                Core Components/Risk Factors/Patient Goals at Discharge (Final Review):   Goals and Risk Factor Review - 03/15/22 0933       Core Components/Risk Factors/Patient Goals Review   Personal Goals Review Weight Management/Obesity;Hypertension;Lipids;Diabetes;Stress    Review Liliane Channel started  intensive cardiac rehab on 03/14/22. Rick did well with exercise. Vital signs and CBG's were stable    Expected Outcomes Liliane Channel will contiue to participate in phase 2 cardiac rehab for exercise, nutrition and lifestyle modifications             ITP Comments:  ITP Comments     Row Name 03/10/22 1520 03/15/22 0925         ITP Comments Dr Fransico Him MD, Medical Director Introduction to [facility] Intensive Cardiac Re[facility] Program/ Initial [facility] Orientation Packet  Reviewed with the patient and his wife. 30 Day ITP Review. Liliane Channel started intensvie cardiac rehab on 03/14/22. Rick did well with exercise.               Comments: See ITP comments

## 2022-03-18 ENCOUNTER — Encounter (HOSPITAL_COMMUNITY)
Admission: RE | Admit: 2022-03-18 | Discharge: 2022-03-18 | Disposition: A | Discharge status: Home / Self Care | Payer: No Typology Code available for payment source | Source: Ambulatory Visit | Attending: Cardiology | Admitting: Cardiology

## 2022-03-18 ENCOUNTER — Encounter (HOSPITAL_COMMUNITY): Payer: No Typology Code available for payment source

## 2022-03-18 DIAGNOSIS — Z955 Presence of coronary angioplasty implant and graft: Secondary | ICD-10-CM

## 2022-03-18 LAB — GLUCOSE, CAPILLARY: Glucose-Capillary: 122 mg/dL — ABNORMAL HIGH (ref 70–99)

## 2022-03-21 ENCOUNTER — Encounter (HOSPITAL_COMMUNITY)
Admission: RE | Admit: 2022-03-21 | Discharge: 2022-03-21 | Disposition: A | Discharge status: Home / Self Care | Payer: No Typology Code available for payment source | Source: Ambulatory Visit | Attending: Cardiology | Admitting: Cardiology

## 2022-03-21 ENCOUNTER — Encounter (HOSPITAL_COMMUNITY): Payer: No Typology Code available for payment source

## 2022-03-21 DIAGNOSIS — E119 Type 2 diabetes mellitus without complications: Secondary | ICD-10-CM | POA: Insufficient documentation

## 2022-03-21 DIAGNOSIS — Z955 Presence of coronary angioplasty implant and graft: Secondary | ICD-10-CM | POA: Diagnosis present

## 2022-03-21 DIAGNOSIS — Z48812 Encounter for surgical aftercare following surgery on the circulatory system: Secondary | ICD-10-CM | POA: Insufficient documentation

## 2022-03-21 LAB — GLUCOSE, CAPILLARY
Glucose-Capillary: 195 mg/dL — ABNORMAL HIGH (ref 70–99)
Glucose-Capillary: 203 mg/dL — ABNORMAL HIGH (ref 70–99)

## 2022-03-23 ENCOUNTER — Encounter (HOSPITAL_COMMUNITY): Payer: No Typology Code available for payment source

## 2022-03-23 ENCOUNTER — Encounter (HOSPITAL_COMMUNITY)
Admission: RE | Admit: 2022-03-23 | Discharge: 2022-03-23 | Disposition: A | Discharge status: Home / Self Care | Payer: No Typology Code available for payment source | Source: Ambulatory Visit | Attending: Cardiology | Admitting: Cardiology

## 2022-03-23 ENCOUNTER — Encounter: Payer: Self-pay | Admitting: Cardiology

## 2022-03-23 DIAGNOSIS — Z48812 Encounter for surgical aftercare following surgery on the circulatory system: Secondary | ICD-10-CM | POA: Diagnosis not present

## 2022-03-23 DIAGNOSIS — Z955 Presence of coronary angioplasty implant and graft: Secondary | ICD-10-CM

## 2022-03-23 LAB — GLUCOSE, CAPILLARY: Glucose-Capillary: 160 mg/dL — ABNORMAL HIGH (ref 70–99)

## 2022-03-23 NOTE — Progress Notes (Signed)
CARDIAC REHAB PHASE 2  Reviewed home exercise with pt today. Pt is tolerating exercise well. Pt will continue to exercise on his own by walking, doing wts and exercising at the [facility] for 30-45 minutes per session 2-3 days a week in addition to the 3 days in CRP2. Advised pt on THRR, RPE scale, hydration and temperature/humidity precautions. Reinforced NTG use, S/S to stop exercise and when to call MD vs 911. Encouraged warm up cool down and stretches with exercise sessions. Pt verbalized understanding, all questions were answered and pt was given a copy to take home.    Kirby Funk ACSM-CEP 03/23/2022 4:34 PM

## 2022-03-24 NOTE — Telephone Encounter (Signed)
Please see the MyChart message reply(ies) for my assessment and plan.    This patient gave consent for this Medical Advice Message and is aware that it may result in a bill to their insurance company, as well as the possibility of receiving a bill for a co-payment or deductible. They are an established patient, but are not seeking medical advice exclusively about a problem treated during an in person or video visit in the last seven days. I did not recommend an in person or video visit within seven days of my reply.    I spent a total of 6 minutes cumulative time within 7 days through MyChart messaging.  Alben Jepsen, MD   

## 2022-03-25 ENCOUNTER — Encounter (HOSPITAL_COMMUNITY)
Admission: RE | Admit: 2022-03-25 | Discharge: 2022-03-25 | Disposition: A | Discharge status: Home / Self Care | Payer: No Typology Code available for payment source | Source: Ambulatory Visit | Attending: Cardiology | Admitting: Cardiology

## 2022-03-25 ENCOUNTER — Encounter (HOSPITAL_COMMUNITY): Payer: No Typology Code available for payment source

## 2022-03-25 DIAGNOSIS — Z48812 Encounter for surgical aftercare following surgery on the circulatory system: Secondary | ICD-10-CM | POA: Diagnosis not present

## 2022-03-25 DIAGNOSIS — Z955 Presence of coronary angioplasty implant and graft: Secondary | ICD-10-CM

## 2022-03-28 ENCOUNTER — Encounter (HOSPITAL_COMMUNITY)
Admission: RE | Admit: 2022-03-28 | Discharge: 2022-03-28 | Disposition: A | Discharge status: Home / Self Care | Payer: No Typology Code available for payment source | Source: Ambulatory Visit | Attending: Cardiology | Admitting: Cardiology

## 2022-03-28 ENCOUNTER — Encounter (HOSPITAL_COMMUNITY): Payer: No Typology Code available for payment source

## 2022-03-28 DIAGNOSIS — Z48812 Encounter for surgical aftercare following surgery on the circulatory system: Secondary | ICD-10-CM | POA: Diagnosis not present

## 2022-03-28 DIAGNOSIS — Z955 Presence of coronary angioplasty implant and graft: Secondary | ICD-10-CM

## 2022-03-30 ENCOUNTER — Encounter (HOSPITAL_COMMUNITY)
Admission: RE | Admit: 2022-03-30 | Discharge: 2022-03-30 | Disposition: A | Discharge status: Home / Self Care | Payer: No Typology Code available for payment source | Source: Ambulatory Visit | Attending: Cardiology | Admitting: Cardiology

## 2022-03-30 ENCOUNTER — Encounter (HOSPITAL_COMMUNITY): Payer: No Typology Code available for payment source

## 2022-03-30 DIAGNOSIS — Z48812 Encounter for surgical aftercare following surgery on the circulatory system: Secondary | ICD-10-CM | POA: Diagnosis not present

## 2022-03-30 DIAGNOSIS — Z955 Presence of coronary angioplasty implant and graft: Secondary | ICD-10-CM

## 2022-03-31 ENCOUNTER — Inpatient Hospital Stay (HOSPITAL_COMMUNITY): Admission: RE | Admit: 2022-03-31 | Payer: No Typology Code available for payment source | Source: Ambulatory Visit

## 2022-03-31 IMAGING — DX DG CHEST 1V PORT
1 series · 1 of 1 positions shown · non-contrast
Comparison: 05/01/2021

CLINICAL DATA: Cough, fever, congestion, and weakness.

EXAM:
PORTABLE CHEST 1 VIEW

[chest ap]
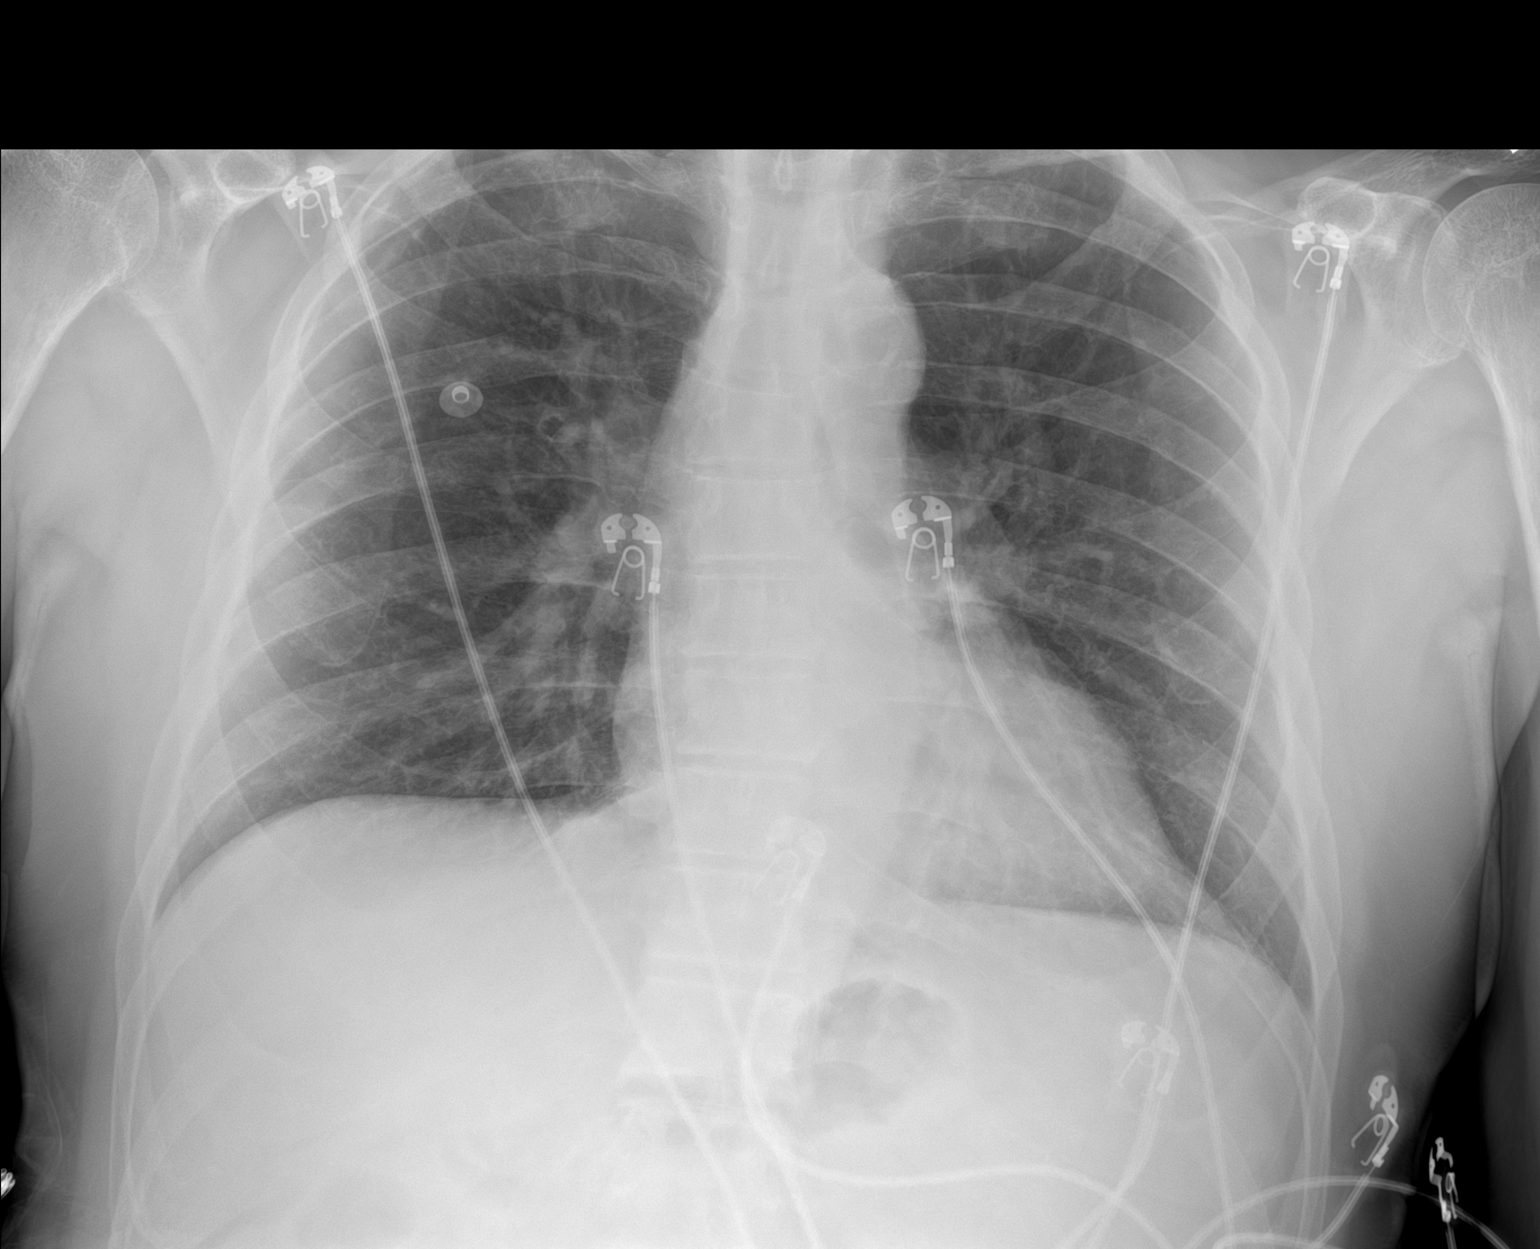

[1 of 1 positions shown; findings below may reference images not displayed]

FINDINGS: The cardiomediastinal silhouette is unchanged with normal heart
size. Right greater than left apical scarring is noted. No acute
airspace consolidation, edema, sizable pleural effusion,
pneumothorax is identified. No acute osseous abnormality is seen.
IMPRESSION: No active disease.

## 2022-04-01 ENCOUNTER — Encounter (HOSPITAL_COMMUNITY): Payer: No Typology Code available for payment source

## 2022-04-01 ENCOUNTER — Ambulatory Visit (HOSPITAL_COMMUNITY)
Admission: RE | Admit: 2022-04-01 | Discharge: 2022-04-01 | Disposition: A | Discharge status: Home / Self Care | Payer: HMO | Source: Ambulatory Visit | Attending: Cardiovascular Disease | Admitting: Cardiovascular Disease

## 2022-04-01 ENCOUNTER — Encounter (HOSPITAL_COMMUNITY)
Admission: RE | Admit: 2022-04-01 | Discharge: 2022-04-01 | Disposition: A | Discharge status: Home / Self Care | Payer: No Typology Code available for payment source | Source: Ambulatory Visit | Attending: Cardiology | Admitting: Cardiology

## 2022-04-01 DIAGNOSIS — Z8249 Family history of ischemic heart disease and other diseases of the circulatory system: Secondary | ICD-10-CM | POA: Diagnosis present

## 2022-04-01 DIAGNOSIS — Z48812 Encounter for surgical aftercare following surgery on the circulatory system: Secondary | ICD-10-CM | POA: Diagnosis not present

## 2022-04-01 DIAGNOSIS — R109 Unspecified abdominal pain: Secondary | ICD-10-CM | POA: Insufficient documentation

## 2022-04-01 DIAGNOSIS — Z136 Encounter for screening for cardiovascular disorders: Secondary | ICD-10-CM | POA: Insufficient documentation

## 2022-04-01 DIAGNOSIS — I10 Essential (primary) hypertension: Secondary | ICD-10-CM | POA: Insufficient documentation

## 2022-04-01 DIAGNOSIS — I2510 Atherosclerotic heart disease of native coronary artery without angina pectoris: Secondary | ICD-10-CM | POA: Insufficient documentation

## 2022-04-01 DIAGNOSIS — Z87891 Personal history of nicotine dependence: Secondary | ICD-10-CM | POA: Insufficient documentation

## 2022-04-01 DIAGNOSIS — Z955 Presence of coronary angioplasty implant and graft: Secondary | ICD-10-CM

## 2022-04-01 DIAGNOSIS — E785 Hyperlipidemia, unspecified: Secondary | ICD-10-CM | POA: Diagnosis not present

## 2022-04-01 DIAGNOSIS — E119 Type 2 diabetes mellitus without complications: Secondary | ICD-10-CM | POA: Diagnosis not present

## 2022-04-04 ENCOUNTER — Encounter (HOSPITAL_COMMUNITY)
Admission: RE | Admit: 2022-04-04 | Discharge: 2022-04-04 | Disposition: A | Discharge status: Home / Self Care | Payer: No Typology Code available for payment source | Source: Ambulatory Visit | Attending: Cardiology | Admitting: Cardiology

## 2022-04-04 ENCOUNTER — Encounter (HOSPITAL_COMMUNITY): Payer: No Typology Code available for payment source

## 2022-04-04 VITALS — Wt 187.0 lb

## 2022-04-04 DIAGNOSIS — Z955 Presence of coronary angioplasty implant and graft: Secondary | ICD-10-CM

## 2022-04-04 DIAGNOSIS — Z48812 Encounter for surgical aftercare following surgery on the circulatory system: Secondary | ICD-10-CM | POA: Diagnosis not present

## 2022-04-04 NOTE — Progress Notes (Signed)
Alex Mccarty 76 y.o. male  Alex Mccarty is extremely motivated to make lifestyle changes to aid with cardiac rehab. 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 Most recent labs show A1c of 6.9, Potassium 5.5, BUN 37, Cr 2.59,  GFR 25. It has been recommended to reduce food sources of potassium; however, patient and his wife report confusion with renal recommendations and heart dietary recommendations. He reports improved sleep since starting new medication for insomnia last week; he is getting >6 hours of restful sleep. He will see endocrinology later this week related to low TSH. Patient has made many dietary changes including reduced fast food/fried foods, increased fiber intake, and reduced sweets/refined sugar. They continue to eat out 1x/day ([employer], [employer],etc). His wife was present today.    RMR: 1834 (x1.3 activity factor)  Labs: GFR 25, Cr 2.59, BUN 37, Chloride 111, potassium 5.5, A1c 6.9, TSH 0.21, triglycerides 153, HDL 27   24 hour diet recall:  Breakfast:oatmeal with berries, banana, raisins Lunch: [place] sandwich, whole wheat bread OR rotisserie chicken, green beans, squash, tomatoes  Dinner: grilled flounder, baked potato salad OR ribs, baked potato Snacks: fruit  Beverages: water, 1x diet coke, coffee 1-2 cups per day.   Nutrition Diagnosis Overweight  related to excessive energy intake as evidenced by a 27.42 Excessive sodium intake related to over consumption of processed food as evidenced by frequent consumption of convenience food/ canned vegetables and eating out frequently.  Nutrition Intervention Pt's individual nutrition plan reviewed with pt. Benefits of adopting Heart Healthy diet discussed.  Continue client-centered nutrition education by RD, as part of interdisciplinary care.  Monitor/Evaluation: Patient reports motivation to make lifestyle changes for adherence to heart healthy  diet recommendation, blood sugar control, and weight management. We discussed the plate method as a guide for meal planning, portion sizes, beverages, high/low potassium food sources, sodium intake from frequency of eating, and food sources of phosphorous. Handouts/notes given ([facility] guidelines for kidney disease, the plate method, [facility] guidelines). Patient amicable to RD suggestions and verbalizes understanding. Will follow-up as needed.   70 minutes spent in review of topics related to a heart healthy diet including sodium intake, blood sugar control, weight management, label reading, hydration, and fiber intake. Start Time: 1:45 End Time: 2:55  Goal(s) Patient to limit saturated fat and trans fat and prioritize fish, poultry, nonfat dairy, and vegetarian protein sources 2.   Patient to increase fiber intake through whole grains, fruits, and vegetables. Patient to  limit refined carbohydrates, simple sugars. Look for products without sugar in the first 3-5 ingredients OR <7g added sugar.  3.   Patient to prioritize water and other zero calorie alternatives; limit/eliminate any sugar beverages. May swap diet coke, unsweetened tea for light colored zero calorie drinks for CKD.  4. Patient to identify high sodium food choices and reduce sodium intake to <2300 mg, aiming for 1500 mg daily. 5. Patient to practice mindful and intuitive eating exercise including eating on a schedule, understanding hunger/fullness, etc. 6. Patient to identify strategies and food quantities to achieve weight/weight gain of 0.5-2.0# per week  7. Reduce intake of high potassium foods; see handout. (Bananas, potatoes, raisins, etc)  Plan:  Will provide client-centered nutrition education as part of interdisciplinary care Monitor and evaluate progress toward nutrition goal with team. Patient given the option to attend [facility] education, cooking workshops, and educational videos   Alex Schleicher [place], MS, RDN, LDN

## 2022-04-06 ENCOUNTER — Encounter (HOSPITAL_COMMUNITY): Payer: No Typology Code available for payment source

## 2022-04-06 ENCOUNTER — Encounter (HOSPITAL_COMMUNITY)
Admission: RE | Admit: 2022-04-06 | Discharge: 2022-04-06 | Disposition: A | Discharge status: Home / Self Care | Payer: No Typology Code available for payment source | Source: Ambulatory Visit | Attending: Cardiology | Admitting: Cardiology

## 2022-04-06 DIAGNOSIS — Z48812 Encounter for surgical aftercare following surgery on the circulatory system: Secondary | ICD-10-CM | POA: Diagnosis not present

## 2022-04-06 DIAGNOSIS — Z955 Presence of coronary angioplasty implant and graft: Secondary | ICD-10-CM

## 2022-04-08 ENCOUNTER — Encounter (HOSPITAL_COMMUNITY)
Admission: RE | Admit: 2022-04-08 | Discharge: 2022-04-08 | Disposition: A | Discharge status: Home / Self Care | Payer: No Typology Code available for payment source | Source: Ambulatory Visit | Attending: Cardiology | Admitting: Cardiology

## 2022-04-08 ENCOUNTER — Encounter (HOSPITAL_COMMUNITY): Payer: No Typology Code available for payment source

## 2022-04-08 DIAGNOSIS — Z48812 Encounter for surgical aftercare following surgery on the circulatory system: Secondary | ICD-10-CM | POA: Diagnosis not present

## 2022-04-08 DIAGNOSIS — Z955 Presence of coronary angioplasty implant and graft: Secondary | ICD-10-CM

## 2022-04-11 ENCOUNTER — Encounter (HOSPITAL_COMMUNITY)
Admission: RE | Admit: 2022-04-11 | Discharge: 2022-04-11 | Disposition: A | Discharge status: Home / Self Care | Payer: No Typology Code available for payment source | Source: Ambulatory Visit | Attending: Cardiology | Admitting: Cardiology

## 2022-04-11 ENCOUNTER — Encounter (HOSPITAL_COMMUNITY): Payer: No Typology Code available for payment source

## 2022-04-11 DIAGNOSIS — Z48812 Encounter for surgical aftercare following surgery on the circulatory system: Secondary | ICD-10-CM | POA: Diagnosis not present

## 2022-04-11 DIAGNOSIS — Z955 Presence of coronary angioplasty implant and graft: Secondary | ICD-10-CM

## 2022-04-13 ENCOUNTER — Encounter (HOSPITAL_COMMUNITY): Payer: No Typology Code available for payment source

## 2022-04-13 ENCOUNTER — Encounter (HOSPITAL_COMMUNITY)
Admission: RE | Admit: 2022-04-13 | Discharge: 2022-04-13 | Disposition: A | Discharge status: Home / Self Care | Payer: No Typology Code available for payment source | Source: Ambulatory Visit | Attending: Cardiology | Admitting: Cardiology

## 2022-04-13 DIAGNOSIS — Z955 Presence of coronary angioplasty implant and graft: Secondary | ICD-10-CM

## 2022-04-13 DIAGNOSIS — Z48812 Encounter for surgical aftercare following surgery on the circulatory system: Secondary | ICD-10-CM | POA: Diagnosis not present

## 2022-04-13 NOTE — Progress Notes (Signed)
Cardiac Individual Treatment Plan  Patient Details  Name: Alex Mccarty MRN: [id number] Date of Birth: Jul 02, 1946 Referring Provider:   Flowsheet Row INTENSIVE CARDIAC REHAB ORIENT from 03/10/2022 in [place]  Referring Provider Candee Furbish, MD       Initial Encounter Date:  [facility] from 03/10/2022 in [place]  Date 03/10/22       Visit Diagnosis: 01/28/22 S/P DES x 2 RCA  Patient's Home Medications on Admission:  Current Outpatient Medications:    acetaminophen (TYLENOL) 500 MG tablet, Take 1,000 mg by mouth every 6 (six) hours as needed for moderate pain., Disp: , Rfl:    amLODipine (NORVASC) 2.5 MG tablet, Take 2.5 mg by mouth daily., Disp: , Rfl:    aspirin 81 MG EC tablet, Take 81 mg by mouth at bedtime., Disp: , Rfl:    atorvastatin (LIPITOR) 80 MG tablet, Take 80 mg by mouth at bedtime. , Disp: , Rfl:    calcium elemental as carbonate (TUMS ULTRA 1000) 400 MG chewable tablet, Chew 2,000 mg by mouth daily as needed for heartburn., Disp: , Rfl:    Cholecalciferol (VITAMIN D3) 50 MCG (2000 UT) TABS, Take 2,000 Units by mouth daily., Disp: , Rfl:    clopidogrel (PLAVIX) 75 MG tablet, Take 75 mg by mouth daily., Disp: , Rfl:    cyclobenzaprine (FLEXERIL) 10 MG tablet, Take 10 mg by mouth as needed (muscle pain). , Disp: , Rfl:    gabapentin (NEURONTIN) 300 MG capsule, Take 300 mg by mouth at bedtime., Disp: , Rfl:    Glucosamine-Chondroitin (COSAMIN DS PO), Take 1 capsule by mouth at bedtime., Disp: , Rfl:    LANTUS SOLOSTAR 100 UNIT/ML Solostar Pen, Inject 46 Units into the skin every morning., Disp: , Rfl:    losartan (COZAAR) 100 MG tablet, Take 100 mg by mouth at bedtime., Disp: , Rfl:    nitroGLYCERIN (NITROSTAT) 0.4 MG SL tablet, Place 1 tablet (0.4 mg total) under the tongue every 5 (five) minutes as needed for chest pain., Disp: 25 tablet, Rfl: 3   pantoprazole  (PROTONIX) 40 MG tablet, Take 1 tablet (40 mg total) by mouth daily., Disp: 90 tablet, Rfl: 3   primidone (MYSOLINE) 50 MG tablet, Take 50 mg by mouth at bedtime., Disp: , Rfl:    propranolol (INDERAL) 20 MG tablet, Take 20-40 mg by mouth every morning. Take 40 mg in the morning and 20 mg at lunch time, Disp: , Rfl:    tiZANidine (ZANAFLEX) 4 MG tablet, Take 4 mg by mouth every 6 (six) hours as needed for muscle spasms., Disp: , Rfl:    triamcinolone cream (KENALOG) 0.1 %, Apply 1 Application topically 2 (two) times daily as needed (psoriasis)., Disp: , Rfl:    zinc gluconate 50 MG tablet, Take 50 mg by mouth in the morning., Disp: , Rfl:   Past Medical History: Past Medical History:  Diagnosis Date   Arthritis    Bowel obstruction ([place])    CKD (chronic kidney disease)    unkknown stage per patient   Coronary artery disease    Diabetes mellitus ([place])    Hyperlipidemia    Hypertension    Kidney stones     Tobacco Use: Social History   Tobacco Use  Smoking Status Former  Smokeless Tobacco Never  Tobacco Comments   quit over 40 years ago.Around age [age]     Labs: Review Flowsheet  Latest Ref Rng & Units 09/28/2017 05/01/2021 05/02/2021  Labs for ITP Cardiac and Pulmonary Rehab  Cholestrol 0 - 200 mg/dL - - 97   LDL (calc) 0 - 99 mg/dL - - 55   HDL-C >40 mg/dL - - 25   Trlycerides <150 mg/dL - - 84   Hemoglobin A1c 4.8 - 5.6 % 7.0  6.6  -    Capillary Blood Glucose: Lab Results  Component Value Date   GLUCAP 160 (H) 03/23/2022   GLUCAP 203 (H) 03/21/2022   GLUCAP 122 (H) 03/18/2022   GLUCAP 195 (H) 03/18/2022   GLUCAP 115 (H) 03/16/2022     Exercise Target Goals: Exercise Program Goal: Individual exercise prescription set using results from initial 6 min walk test and THRR while considering  patient's activity barriers and safety.   Exercise Prescription Goal: Initial exercise prescription builds to 30-45 minutes a day of aerobic activity, 2-3 days per week.   Home exercise guidelines will be given to patient during program as part of exercise prescription that the participant will acknowledge.  Activity Barriers & Risk Stratification:  Activity Barriers & Cardiac Risk Stratification - 03/10/22 1214       Activity Barriers & Cardiac Risk Stratification   Activity Barriers Arthritis;Back Problems;Right Hip Replacement;Neck/Spine Problems;Joint Problems;Balance Concerns    Cardiac Risk Stratification High             6 Minute Walk:  6 Minute Walk     Row Name 03/10/22 1103         6 Minute Walk   Phase Initial     Distance 1531 feet     Walk Time 6 minutes     # of Rest Breaks 0     MPH 2.9     METS 3.39     RPE 12     Perceived Dyspnea  0     VO2 Peak 11.9     Symptoms No     Resting HR 69 bpm     Resting BP 128/56     Resting Oxygen Saturation  96 %     Exercise Oxygen Saturation  during 6 min walk 99 %     Max Ex. HR 104 bpm     Max Ex. BP 158/54     2 Minute Post BP 138/56              Oxygen Initial Assessment:   Oxygen Re-Evaluation:   Oxygen Discharge (Final Oxygen Re-Evaluation):   Initial Exercise Prescription:  Initial Exercise Prescription - 03/10/22 1200       Date of Initial Exercise RX and Referring Provider   Date 03/10/22    Referring Provider Candee Furbish, MD    Expected Discharge Date 05/06/22      Recumbant Bike   Level 2    RPM 60    Watts 35    Minutes 15    METs 3.3      NuStep   Level 2    SPM 75    Minutes 15    METs 3.3      Prescription Details   Frequency (times per week) 3    Duration Progress to 30 minutes of continuous aerobic without signs/symptoms of physical distress      Intensity   THRR 40-80% of Max Heartrate 59-118    Ratings of Perceived Exertion 11-13    Perceived Dyspnea 0-4      Progression   Progression Continue progressive overload as per policy without  signs/symptoms or physical distress.      Resistance Training   Training Prescription Yes     Weight 3 lbs    Reps 10-15             Perform Capillary Blood Glucose checks as needed.  Exercise Prescription Changes:   Exercise Prescription Changes     Row Name 03/14/22 1400 03/23/22 1623 04/08/22 1621         Response to Exercise   Blood Pressure (Admit) 120/60 122/78 112/62     Blood Pressure (Exercise) 138/60 118/58 130/62     Blood Pressure (Exit) 118/72 122/62 110/60     Heart Rate (Admit) 67 bpm 69 bpm 61 bpm     Heart Rate (Exercise) 91 bpm 95 bpm 97 bpm     Heart Rate (Exit) 71 bpm 69 bpm 70 bpm     Rating of Perceived Exertion (Exercise) _0 Perceived Dyspnea (Exercise) -- 0 0     Symptoms None None None     Comments Pt's first day in the CRP2 program Reviewed MET's, goals and home ExRx Reviewed MET's     Duration Continue with 30 min of aerobic exercise without signs/symptoms of physical distress. Continue with 30 min of aerobic exercise without signs/symptoms of physical distress. Continue with 30 min of aerobic exercise without signs/symptoms of physical distress.     Intensity THRR unchanged THRR unchanged THRR unchanged       Progression   Progression Continue to progress workloads to maintain intensity without signs/symptoms of physical distress. Continue to progress workloads to maintain intensity without signs/symptoms of physical distress. Continue to progress workloads to maintain intensity without signs/symptoms of physical distress.     Average METs 2.35 2.5 2.95       Resistance Training   Training Prescription Yes No Yes     Weight 3 lbs -- 3lb     Reps 10-15 -- 10-15     Time 10 Minutes -- 10 Minutes       Interval Training   Interval Training No No --       Recumbant Bike   Level _1 RPM -- 80 80     Minutes _2 METs 2.8 2.3 2.7       NuStep   Level -- 4 4     SPM -- -- 90     Minutes -- -- 15     METs -- -- 3.2       T5 Nustep   Level 3 3 --     SPM 80 80 --     Minutes 15 15 --     METs 2.8 2.7  --       Home Exercise Plan   Plans to continue exercise at -- Home (comment) Home (comment)     Frequency -- Add 2 additional days to program exercise sessions. Add 2 additional days to program exercise sessions.     Initial Home Exercises Provided -- 03/23/22 03/23/22              Exercise Comments:   Exercise Comments     Row Name 03/14/22 1426 03/23/22 1634 04/08/22 1626       Exercise Comments Pt's first day in the CPR2 program. Pt had no complaints with todays session and is off to a good start. Reviewed MET's, goals and home ExRx with pt today. Pt is tolerating  exercise well with an average MET level of 2.5. Pt will [facility] and using dumbells with our wts packet 2-3 days for 30-45 mins. Pt feels good about his goals of being less fatigue and is working on wt loss, pt is also happy about being in a regular exercise routine and getting back to his normal gym as well. Reviewed MET's with pt today. Pt is tolerating exercise well with an average MET level of 2.95. Pt is progressing well and is motivated. Will increase nustep on next visit.              Exercise Goals and Review:   Exercise Goals     Row Name 03/10/22 1216             Exercise Goals   Increase Physical Activity Yes       Intervention Provide advice, education, support and counseling about physical activity/exercise needs.;Develop an individualized exercise prescription for aerobic and resistive training based on initial evaluation findings, risk stratification, comorbidities and participant's personal goals.       Expected Outcomes Short Term: Attend rehab on a regular basis to increase amount of physical activity.;Long Term: Add in home exercise to make exercise part of routine and to increase amount of physical activity.;Long Term: Exercising regularly at least 3-5 days a week.       Increase Strength and Stamina Yes       Intervention  Provide advice, education, support and counseling about physical activity/exercise needs.;Develop an individualized exercise prescription for aerobic and resistive training based on initial evaluation findings, risk stratification, comorbidities and participant's personal goals.       Expected Outcomes Short Term: Increase workloads from initial exercise prescription for resistance, speed, and METs.;Short Term: Perform resistance training exercises routinely during rehab and add in resistance training at home;Long Term: Improve cardiorespiratory fitness, muscular endurance and strength as measured by increased METs and functional capacity (6MWT)       Able to understand and use rate of perceived exertion (RPE) scale Yes       Intervention Provide education and explanation on how to use RPE scale       Expected Outcomes Short Term: Able to use RPE daily in rehab to express subjective intensity level;Long Term:  Able to use RPE to guide intensity level when exercising independently       Knowledge and understanding of Target Heart Rate Range (THRR) Yes       Intervention Provide education and explanation of THRR including how the numbers were predicted and where they are located for reference       Expected Outcomes Short Term: Able to state/look up THRR;Short Term: Able to use daily as guideline for intensity in rehab;Long Term: Able to use THRR to govern intensity when exercising independently       Understanding of Exercise Prescription Yes       Intervention Provide education, explanation, and written materials on patient's individual exercise prescription       Expected Outcomes Short Term: Able to explain program exercise prescription;Long Term: Able to explain home exercise prescription to exercise independently                Exercise Goals Re-Evaluation :  Exercise Goals Re-Evaluation     Row Name 03/14/22 1425 03/23/22 1628           Exercise Goal Re-Evaluation   Exercise Goals  Review Increase Physical Activity;Increase  Strength and Stamina;Able to understand and use rate of perceived exertion (RPE) scale;Knowledge and understanding of Target Heart Rate Range (THRR);Understanding of Exercise Prescription Increase Physical Activity;Increase Strength and Stamina;Able to understand and use rate of perceived exertion (RPE) scale;Knowledge and understanding of Target Heart Rate Range (THRR);Understanding of Exercise Prescription      Comments Pt's first day in the CRP2 program. Pt understands the exercise Rx, RPE scale and THRR Reviewed MET's, goals and home ExRx with pt today. Pt is tolerating exercise well with an average MET level of 2.5. Pt will continue to exercise on [facility] by walking and using dumbells with our wts packet 2-3 days for 30-45 mins. Pt feels good about his goals of being less fatigue and is working on wt loss, pt is also happy about being in a regular exercise routine and getting back to his normal gym as well.      Expected Outcomes Will cotniue to monitor pt and progress exercise workloads as tolerated. Will cotniue to monitor pt and progress exercise workloads as tolerated.               Discharge Exercise Prescription (Final Exercise Prescription Changes):  Exercise Prescription Changes - 04/08/22 1621       Response to Exercise   Blood Pressure (Admit) 112/62    Blood Pressure (Exercise) 130/62    Blood Pressure (Exit) 110/60    Heart Rate (Admit) 61 bpm    Heart Rate (Exercise) 97 bpm    Heart Rate (Exit) 70 bpm    Rating of Perceived Exertion (Exercise) 11    Perceived Dyspnea (Exercise) 0    Symptoms None    Comments Reviewed MET's    Duration Continue with 30 min of aerobic exercise without signs/symptoms of physical distress.    Intensity THRR unchanged      Progression   Progression Continue to progress workloads to maintain intensity without signs/symptoms of physical distress.    Average METs 2.95       Resistance Training   Training Prescription Yes    Weight 3lb    Reps 10-15    Time 10 Minutes      Recumbant Bike   Level 4    RPM 80    Minutes 15    METs 2.7      NuStep   Level 4    SPM 90    Minutes 15    METs 3.2      Home Exercise Plan   Plans to continue exercise at Home (comment)    Frequency Add 2 additional days to program exercise sessions.    Initial Home Exercises Provided 03/23/22             Nutrition:  Target Goals: Understanding of nutrition guidelines, daily intake of sodium <1511m, cholesterol <2035m calories 30% from fat and 7% or less from saturated fats, daily to have 5 or more servings of fruits and vegetables.  Biometrics:  Pre Biometrics - 03/10/22 1015       Pre Biometrics   Waist Circumference 41 inches    Hip Circumference 41 inches    Waist to Hip Ratio 1 %    Triceps Skinfold 13 mm    % Body Fat 27.6 %    Grip Strength 40 kg    Flexibility --   Not performed due to back problems   Single Leg Stand 10.2 seconds  Nutrition Therapy Plan and Nutrition Goals:  Nutrition Therapy & Goals - 04/04/22 1636       Nutrition Therapy   Diet Heart Healthy/Carbohydrate Consistent/Renal Diet    Drug/Food Interactions Statins/Certain Fruits    Protein (specify units) 52-69g (0.6-0.8g/kg actual body weight)      Personal Nutrition Goals   Nutrition Goal Patient to choose a variety of fruits, vegetables, whole grains, lean protein/plant protein, nonfat dairy daily as part of heart healthy diet    Personal Goal #2 Patient to limit sodium intake to <1565m per day    Personal Goal #3 Patient to identify and limit daily intake of saturated fat, trans fat, sodium, and refined carbohydrates    Comments Met with patient and his wife 1 on 1; reviewed the plate method as a guide for meal planning, carbohydrate portions, protein portions, and potassium intake. Stressed the importance of reducing eating to aid with reduction of  sodium intake. He has made many dietary changes including reduced fast food/fried foods, reduced refined carbohdyrates/sugar, and increased dietary fiber (whole grains, fruits, vegetables).      Intervention Plan   Intervention Prescribe, educate and counsel regarding individualized specific dietary modifications aiming towards targeted core components such as weight, hypertension, lipid management, diabetes, heart failure and other comorbidities.    Expected Outcomes Short Term Goal: Understand basic principles of dietary content, such as calories, fat, sodium, cholesterol and nutrients.;Long Term Goal: Adherence to prescribed nutrition plan.             Nutrition Assessments:  Nutrition Assessments - 03/14/22 1413       Rate Your Plate Scores   Pre Score 41            MEDIFICTS Score Key: ?70 Need to make dietary changes  40-70 Heart Healthy Diet ? 40 Therapeutic Level Cholesterol Diet   Flowsheet Row INTENSIVE CARDIAC REHAB from 03/14/2022 in [facility] Picture Your Plate Total Score on Admission 41      Picture Your Plate Scores: <<86Unhealthy dietary pattern with much room for improvement. 41-50 Dietary pattern unlikely to meet recommendations for good health and room for improvement. 51-60 More healthful dietary pattern, with some room for improvement.  >60 Healthy dietary pattern, although there may be some specific behaviors that could be improved.    Nutrition Goals Re-Evaluation:  Nutrition Goals Re-Evaluation     RLurayName 03/14/22 1355 04/04/22 1636           Goals   Current Weight 191 lb 5.8 oz (86.8 kg) 187 lb (84.8 kg)      Comment Diagnosis of CKD4. Most recent labs GFR 27, potassium 4.9, Cr. 2.4, A1c 7.4, BUN 32 Most recent labs show A1c of 6.9, Potassium 5.5, BUN 37, Cr 2.59,  GFR 25.      Expected Outcome Patient reports he has previously seen a dietitian related to diabetes diagnosis; he reports uncertainty of what  to eat. PYP score reflect low nutrition knowledge. He has made many dietary changes including reduced fast food/fried foods, reduced refined carbohdyrates/sugar, and increased dietary fiber (whole grains, fruits, vegetables). Stressed the importance of reducing high potassium foods and high sodium food sources/choices. Expect continued improvements in renal function and A1c with contiued dietary changes.               Nutrition Goals Re-Evaluation:  Nutrition Goals Re-Evaluation     RPaiaName 03/14/22 1355 04/04/22 1636  Goals   Current Weight 191 lb 5.8 oz (86.8 kg) 187 lb (84.8 kg)      Comment Diagnosis of CKD4. Most recent labs GFR 27, potassium 4.9, Cr. 2.4, A1c 7.4, BUN 32 Most recent labs show A1c of 6.9, Potassium 5.5, BUN 37, Cr 2.59,  GFR 25.      Expected Outcome Patient reports he has previously seen a dietitian related to diabetes diagnosis; he reports uncertainty of what to eat. PYP score reflect low nutrition knowledge. He has made many dietary changes including reduced fast food/fried foods, reduced refined carbohdyrates/sugar, and increased dietary fiber (whole grains, fruits, vegetables). Stressed the importance of reducing high potassium foods and high sodium food sources/choices. Expect continued improvements in renal function and A1c with contiued dietary changes.               Nutrition Goals Discharge (Final Nutrition Goals Re-Evaluation):  Nutrition Goals Re-Evaluation - 04/04/22 1636       Goals   Current Weight 187 lb (84.8 kg)    Comment Most recent labs show A1c of 6.9, Potassium 5.5, BUN 37, Cr 2.59,  GFR 25.    Expected Outcome He has made many dietary changes including reduced fast food/fried foods, reduced refined carbohdyrates/sugar, and increased dietary fiber (whole grains, fruits, vegetables). Stressed the importance of reducing high potassium foods and high sodium food sources/choices. Expect continued improvements in renal function and  A1c with contiued dietary changes.             Psychosocial: Target Goals: Acknowledge presence or absence of significant depression and/or stress, maximize coping skills, provide positive support system. Participant is able to verbalize types and ability to use techniques and skills needed for reducing stress and depression.  Initial Review & Psychosocial Screening:  Initial Psych Review & Screening - 03/10/22 1527       Initial Review   Current issues with Current Stress Concerns    Source of Stress Concerns Chronic Illness;Unable to participate in former interests or hobbies;Unable to perform yard/household activities      [place]? Yes   Liliane Channel has his wife for support     Barriers   Psychosocial barriers to participate in program The patient should benefit from training in stress management and relaxation.      Screening Interventions   Interventions Encouraged to exercise;To provide support and resources with identified psychosocial needs;Provide feedback about the scores to participant    Expected Outcomes Short Term goal: Identification and review with participant of any Quality of Life or Depression concerns found by scoring the questionnaire.;Long Term Goal: Stressors or current issues are controlled or eliminated.;Long Term goal: The participant improves quality of Life and PHQ9 Scores as seen by post scores and/or verbalization of changes             Quality of Life Scores:  Quality of Life - 03/10/22 1225       Quality of Life   Select Quality of Life      Quality of Life Scores   Health/Function Pre 16.77 %    Socioeconomic Pre 22.13 %    Psych/Spiritual Pre 18.86 %    Family Pre 22.8 %    GLOBAL Pre 19.27 %            Scores of 19 and below usually indicate a poorer quality of life in these areas.  A difference of  2-3 points is a clinically meaningful difference.  A difference of  2-3 points in the total score of the  Quality of Life Index has been associated with significant improvement in overall quality of life, self-image, physical symptoms, and general health in studies assessing change in quality of life.  PHQ-9: Review Flowsheet       03/10/2022 09/03/2019 12/07/2018  Depression screen PHQ 2/9  Decreased Interest 0 0 0  Down, Depressed, Hopeless 0 0 0  PHQ - 2 Score 0 0 0   Interpretation of Total Score  Total Score Depression Severity:  1-4 = Minimal depression, 5-9 = Mild depression, 10-14 = Moderate depression, 15-19 = Moderately severe depression, 20-27 = Severe depression   Psychosocial Evaluation and Intervention:   Psychosocial Re-Evaluation:  Psychosocial Re-Evaluation     Row Name 03/15/22 0931 04/13/22 1712           Psychosocial Re-Evaluation   Current issues with Current Stress Concerns Current Stress Concerns      Comments Liliane Channel did not voice any increased stressors on his first day of exercise at [employer]. Will review quality of life in the next week Liliane Channel has  not voiced any increased stressors  at [employer]. Will review quality of life in the next week      Expected Outcomes Liliane Channel will have decreased stress upon completion of phase 2 cardiac rehab. Liliane Channel will have decreased stress upon completion of phase 2 cardiac rehab.      Interventions Stress management education;Relaxation education;Encouraged to attend Cardiac Rehabilitation for the exercise Stress management education;Relaxation education;Encouraged to attend Cardiac Rehabilitation for the exercise      Continue Psychosocial Services  Follow up required by staff Follow up required by staff        Initial Review   Source of Stress Concerns Chronic Illness;Unable to perform yard/household activities;Unable to participate in former interests or hobbies Chronic Illness;Unable to perform yard/household activities;Unable to participate in former interests or hobbies      Comments Will continue to monitor and offer support as needed Will  continue to monitor and offer support as needed               Psychosocial Discharge (Final Psychosocial Re-Evaluation):  Psychosocial Re-Evaluation - 04/13/22 1712       Psychosocial Re-Evaluation   Current issues with Current Stress Concerns    Comments Liliane Channel has  not voiced any increased stressors  at [employer]. Will review quality of life in the next week    Expected Outcomes Liliane Channel will have decreased stress upon completion of phase 2 cardiac rehab.    Interventions Stress management education;Relaxation education;Encouraged to attend Cardiac Rehabilitation for the exercise    Continue Psychosocial Services  Follow up required by staff      Initial Review   Source of Stress Concerns Chronic Illness;Unable to perform yard/household activities;Unable to participate in former interests or hobbies    Comments Will continue to monitor and offer support as needed             Vocational Rehabilitation: Provide vocational rehab assistance to qualifying candidates.   Vocational Rehab Evaluation & Intervention:  Vocational Rehab - 03/10/22 1530       Initial Vocational Rehab Evaluation & Intervention   Assessment shows need for Vocational Rehabilitation No   Liliane Channel is retired and does not need vocational rehab at this time            Education: Education Goals: Education classes will be provided on a weekly basis, covering required topics. Participant will state understanding/return demonstration of topics  presented.    Education - 04/13/22 1500       Education   Cardiac Education Topics Pritikin    Education officer, museum Exercise Physiologist    Weekly Topic Simple Sides and Sauces    Instruction Review Code 1- Verbalizes Understanding    Class Start Time 8315    Class Stop Time 1439    Class Time Calculation (min) 52 min             Core Videos: Exercise    Move It!  Clinical staff conducted group or individual video education  with verbal and written material and guidebook.  Patient learns the recommended Pritikin exercise program. Exercise with the goal of living a long, healthy life. Some of the health benefits of exercise include controlled diabetes, healthier blood pressure levels, improved cholesterol levels, improved heart and lung capacity, improved sleep, and better body composition. Everyone should speak with their doctor before starting or changing an exercise routine.  Biomechanical Limitations Clinical staff conducted group or individual video education with verbal and written material and guidebook.  Patient learns how biomechanical limitations can impact exercise and how we can mitigate and possibly overcome limitations to have an impactful and balanced exercise routine.  Body Composition Clinical staff conducted group or individual video education with verbal and written material and guidebook.  Patient learns that body composition (ratio of muscle mass to fat mass) is a key component to assessing overall fitness, rather than body weight alone. Increased fat mass, especially visceral belly fat, can put [place] at increased risk for metabolic syndrome, type 2 diabetes, heart disease, and even death. It is recommended to combine diet and exercise (cardiovascular and resistance training) to improve your body composition. Seek guidance from your physician and exercise physiologist before implementing an exercise routine.  Exercise Action Plan Clinical staff conducted group or individual video education with verbal and written material and guidebook.  Patient learns the recommended strategies to achieve and enjoy long-term exercise adherence, including variety, self-motivation, self-efficacy, and positive decision making. Benefits of exercise include fitness, good health, weight management, more energy, better sleep, less stress, and overall well-being.  Medical   Heart Disease Risk Reduction Clinical staff conducted  group or individual video education with verbal and written material and guidebook.  Patient learns our heart is our most vital organ as it circulates oxygen, nutrients, white blood cells, and hormones throughout the entire body, and carries waste away. Data supports a plant-based eating plan like the Pritikin Program for its effectiveness in slowing progression of and reversing heart disease. The video provides a number of recommendations to address heart disease.   Metabolic Syndrome and Belly Fat  Clinical staff conducted group or individual video education with verbal and written material and guidebook.  Patient learns what metabolic syndrome is, how it leads to heart disease, and how one can reverse it and keep it from coming back. You have metabolic syndrome if you have 3 of the following 5 criteria: abdominal obesity, high blood pressure, high triglycerides, low HDL cholesterol, and high blood sugar.  Hypertension and Heart Disease Clinical staff conducted group or individual video education with verbal and written material and guidebook.  Patient learns that high blood pressure, or hypertension, is very common in the [place]. Hypertension is largely due to excessive salt intake, but other important risk factors include being overweight, physical inactivity, drinking too much alcohol, smoking, and not eating enough potassium from  fruits and vegetables. High blood pressure is a leading risk factor for heart attack, stroke, congestive heart failure, dementia, kidney failure, and premature death. Long-term effects of excessive salt intake include stiffening of the arteries and thickening of heart muscle and organ damage. Recommendations include ways to reduce hypertension and the risk of heart disease.  Diseases of Our Time - Focusing on Diabetes Clinical staff conducted group or individual video education with verbal and written material and guidebook.  Patient learns why the best way to stop  diseases of our time is prevention, through food and other lifestyle changes. Medicine (such as prescription pills and surgeries) is often only a Band-Aid on the problem, not a long-term solution. Most common diseases of our time include obesity, type 2 diabetes, hypertension, heart disease, and cancer. The Pritikin Program is recommended and has been proven to help reduce, reverse, and/or prevent the damaging effects of metabolic syndrome.  Nutrition   Overview of the Pritikin Eating Plan  Clinical staff conducted group or individual video education with verbal and written material and guidebook.  Patient learns about the [place] for disease risk reduction. The [place] emphasizes a wide variety of unrefined, minimally-processed carbohydrates, like fruits, vegetables, whole grains, and legumes. Go, Caution, and Stop food choices are explained. Plant-based and lean animal proteins are emphasized. Rationale provided for low sodium intake for blood pressure control, low added sugars for blood sugar stabilization, and low added fats and oils for coronary artery disease risk reduction and weight management.  Calorie Density  Clinical staff conducted group or individual video education with verbal and written material and guidebook.  Patient learns about calorie density and how it impacts the Pritikin Eating Plan. Knowing the characteristics of the food you choose will help you decide whether those foods will lead to weight gain or weight loss, and whether you want to consume more or less of them. Weight loss is usually a side effect of the Pritikin Eating Plan because of its focus on low calorie-dense foods.  Label Reading  Clinical staff conducted group or individual video education with verbal and written material and guidebook.  Patient learns about the Pritikin recommended label reading guidelines and corresponding recommendations regarding calorie density, added sugars, sodium  content, and whole grains.  Dining Out - Part 1  Clinical staff conducted group or individual video education with verbal and written material and guidebook.  Patient learns that restaurant meals can be sabotaging because they can be so high in calories, fat, sodium, and/or sugar. Patient learns recommended strategies on how to positively address this and avoid unhealthy pitfalls.  Facts on Fats  Clinical staff conducted group or individual video education with verbal and written material and guidebook.  Patient learns that lifestyle modifications can be just as effective, if not more so, as many medications for lowering your risk of heart disease. A Pritikin lifestyle can help to reduce your risk of inflammation and atherosclerosis (cholesterol build-up, or plaque, in the artery walls). Lifestyle interventions such as dietary choices and physical activity address the cause of atherosclerosis. A review of the types of fats and their impact on blood cholesterol levels, along with dietary recommendations to reduce fat intake is also included.  Nutrition Action Plan  Clinical staff conducted group or individual video education with verbal and written material and guidebook.  Patient learns how to incorporate Pritikin recommendations into their lifestyle. Recommendations include planning and keeping personal health goals in mind as an important part of their  success.  Healthy Mind-Set    Healthy Minds, Bodies, Hearts  Clinical staff conducted group or individual video education with verbal and written material and guidebook.  Patient learns how to identify when they are stressed. Video will discuss the impact of that stress, as well as the many benefits of stress management. Patient will also be introduced to stress management techniques. The way we think, act, and feel has an impact on our hearts.  How Our Thoughts Can Heal Our Hearts  Clinical staff conducted group or individual video education  with verbal and written material and guidebook.  Patient learns that negative thoughts can cause depression and anxiety. This can result in negative lifestyle behavior and serious health problems. Cognitive behavioral therapy is an effective method to help control our thoughts in order to change and improve our emotional outlook.  Additional Videos:  Exercise    Improving Performance  Clinical staff conducted group or individual video education with verbal and written material and guidebook.  Patient learns to use a non-linear approach by alternating intensity levels and lengths of time spent exercising to help burn more calories and lose more body fat. Cardiovascular exercise helps improve heart health, metabolism, hormonal balance, blood sugar control, and recovery from fatigue. Resistance training improves strength, endurance, balance, coordination, reaction time, metabolism, and muscle mass. Flexibility exercise improves circulation, posture, and balance. Seek guidance from your physician and exercise physiologist before implementing an exercise routine and learn your capabilities and proper form for all exercise.  Introduction to Yoga  Clinical staff conducted group or individual video education with verbal and written material and guidebook.  Patient learns about yoga, a discipline of the coming together of mind, breath, and body. The benefits of yoga include improved flexibility, improved range of motion, better posture and core strength, increased lung function, weight loss, and positive self-image. Yoga's heart health benefits include lowered blood pressure, healthier heart rate, decreased cholesterol and triglyceride levels, improved immune function, and reduced stress. Seek guidance from your physician and exercise physiologist before implementing an exercise routine and learn your capabilities and proper form for all exercise.  Medical   Aging: Enhancing Your Quality of Life  Clinical  staff conducted group or individual video education with verbal and written material and guidebook.  Patient learns key strategies and recommendations to stay in good physical health and enhance quality of life, such as prevention strategies, having an advocate, securing a [place], and keeping a list of medications and system for tracking them. It also discusses how to avoid risk for bone loss.  Biology of Weight Control  Clinical staff conducted group or individual video education with verbal and written material and guidebook.  Patient learns that weight gain occurs because we consume more calories than we burn (eating more, moving less). Even if your body weight is normal, you may have higher ratios of fat compared to muscle mass. Too much body fat puts you at increased risk for cardiovascular disease, heart attack, stroke, type 2 diabetes, and obesity-related cancers. In addition to exercise, following the [employer] can help reduce your risk.  Decoding Lab Results  Clinical staff conducted group or individual video education with verbal and written material and guidebook.  Patient learns that lab test reflects one measurement whose values change over time and are influenced by many factors, including medication, stress, sleep, exercise, food, hydration, pre-existing medical conditions, and more. It is recommended to use the knowledge from this video to  become more involved with your lab results and evaluate your numbers to speak with your doctor.   Diseases of Our Time - Overview  Clinical staff conducted group or individual video education with verbal and written material and guidebook.  Patient learns that according to the CDC, 50% to 70% of chronic diseases (such as obesity, type 2 diabetes, elevated lipids, hypertension, and heart disease) are avoidable through lifestyle improvements including healthier food choices, listening to satiety cues, and  increased physical activity.  Sleep Disorders Clinical staff conducted group or individual video education with verbal and written material and guidebook.  Patient learns how good quality and duration of sleep are important to overall health and well-being. Patient also learns about sleep disorders and how they impact health along with recommendations to address them, including discussing with a physician.  Nutrition  Dining Out - Part 2 Clinical staff conducted group or individual video education with verbal and written material and guidebook.  Patient learns how to plan ahead and communicate in order to maximize their dining experience in a healthy and nutritious manner. Included are recommended food choices based on the type of restaurant the patient is visiting.   Fueling a Best boy conducted group or individual video education with verbal and written material and guidebook.  There is a strong connection between our food choices and our health. Diseases like obesity and type 2 diabetes are very prevalent and are in large-part due to lifestyle choices. The Pritikin Eating Plan provides plenty of food and hunger-curbing satisfaction. It is easy to follow, affordable, and helps reduce health risks.  Menu Workshop  Clinical staff conducted group or individual video education with verbal and written material and guidebook.  Patient learns that restaurant meals can sabotage health goals because they are often packed with calories, fat, sodium, and sugar. Recommendations include strategies to plan ahead and to communicate with the manager, chef, or server to help order a healthier meal.  Planning Your Eating Strategy  Clinical staff conducted group or individual video education with verbal and written material and guidebook.  Patient learns about the [place] and its benefit of reducing the risk of disease. The [place] does not focus on calories.  Instead, it emphasizes high-quality, nutrient-rich foods. By knowing the characteristics of the foods, we choose, we can determine their calorie density and make informed decisions.  Targeting Your Nutrition Priorities  Clinical staff conducted group or individual video education with verbal and written material and guidebook.  Patient learns that lifestyle habits have a tremendous impact on disease risk and progression. This video provides eating and physical activity recommendations based on your personal health goals, such as reducing LDL cholesterol, losing weight, preventing or controlling type 2 diabetes, and reducing high blood pressure.  Vitamins and Minerals  Clinical staff conducted group or individual video education with verbal and written material and guidebook.  Patient learns different ways to obtain key vitamins and minerals, including through a recommended healthy diet. It is important to discuss all supplements you take with your doctor.   Healthy Mind-Set    Smoking Cessation  Clinical staff conducted group or individual video education with verbal and written material and guidebook.  Patient learns that cigarette smoking and tobacco addiction pose a serious health risk which affects millions of people. Stopping smoking will significantly reduce the risk of heart disease, lung disease, and many forms of cancer. Recommended strategies for quitting are covered, including working with your doctor to  develop a successful plan.  Culinary   Becoming a Financial trader conducted group or individual video education with verbal and written material and guidebook.  Patient learns that cooking at home can be healthy, cost-effective, quick, and puts them in control. Keys to cooking healthy recipes will include looking at your recipe, assessing your equipment needs, planning ahead, making it simple, choosing cost-effective seasonal ingredients, and limiting the use of added  fats, salts, and sugars.  Cooking - Breakfast and Snacks  Clinical staff conducted group or individual video education with verbal and written material and guidebook.  Patient learns how important breakfast is to satiety and nutrition through the entire day. Recommendations include key foods to eat during breakfast to help stabilize blood sugar levels and to prevent overeating at meals later in the day. Planning ahead is also a key component.  Cooking - Human resources officer conducted group or individual video education with verbal and written material and guidebook.  Patient learns eating strategies to improve overall health, including an approach to cook more at home. Recommendations include thinking of animal protein as a side on your plate rather than center stage and focusing instead on lower calorie dense options like vegetables, fruits, whole grains, and plant-based proteins, such as beans. Making sauces in large quantities to freeze for later and leaving the skin on your vegetables are also recommended to maximize your experience.  Cooking - Healthy Salads and Dressing Clinical staff conducted group or individual video education with verbal and written material and guidebook.  Patient learns that vegetables, fruits, whole grains, and legumes are the foundations of the [place]. Recommendations include how to incorporate each of these in flavorful and healthy salads, and how to create homemade salad dressings. Proper handling of ingredients is also covered. Cooking - Soups and [employer] - Soups and Desserts Clinical staff conducted group or individual video education with verbal and written material and guidebook.  Patient learns that Pritikin soups and desserts make for easy, nutritious, and delicious snacks and meal components that are low in sodium, fat, sugar, and calorie density, while high in vitamins, minerals, and filling fiber. Recommendations include  simple and healthy ideas for soups and desserts.   Overview     The Pritikin Solution Program Overview Clinical staff conducted group or individual video education with verbal and written material and guidebook.  Patient learns that the results of the [place] Program have been documented in more than 100 articles published in peer-reviewed journals, and the benefits include reducing risk factors for (and, in some cases, even reversing) high cholesterol, high blood pressure, type 2 diabetes, obesity, and more! An overview of the three key pillars of the Pritikin Program will be covered: eating well, doing regular exercise, and having a healthy mind-set.  WORKSHOPS  Exercise: Exercise Basics: Building Your Action Plan Clinical staff led group instruction and group discussion with PowerPoint presentation and patient guidebook. To enhance the learning environment the use of posters, models and videos may be added. At the conclusion of this workshop, patients will comprehend the difference between physical activity and exercise, as well as the benefits of incorporating both, into their routine. Patients will understand the FITT (Frequency, Intensity, Time, and Type) principle and how to use it to build an exercise action plan. In addition, safety concerns and other considerations for exercise and cardiac rehab will be addressed by the presenter. The purpose of this lesson is to promote a comprehensive and  effective weekly exercise routine in order to improve patients' overall level of fitness.   Managing Heart Disease: Your Path to a Healthier Heart Clinical staff led group instruction and group discussion with PowerPoint presentation and patient guidebook. To enhance the learning environment the use of posters, models and videos may be added.At the conclusion of this workshop, patients will understand the anatomy and physiology of the heart. Additionally, they will understand how Pritikin's three  pillars impact the risk factors, the progression, and the management of heart disease.  The purpose of this lesson is to provide a high-level overview of the heart, heart disease, and how the Pritikin lifestyle positively impacts risk factors.  Exercise Biomechanics Clinical staff led group instruction and group discussion with PowerPoint presentation and patient guidebook. To enhance the learning environment the use of posters, models and videos may be added. Patients will learn how the structural parts of their bodies function and how these functions impact their daily activities, movement, and exercise. Patients will learn how to promote a neutral spine, learn how to manage pain, and identify ways to improve their physical movement in order to promote healthy living. The purpose of this lesson is to expose patients to common physical limitations that impact physical activity. Participants will learn practical ways to adapt and manage aches and pains, and to minimize their effect on regular exercise. Patients will learn how to maintain good posture while sitting, walking, and lifting.  Balance Training and Fall Prevention  Clinical staff led group instruction and group discussion with PowerPoint presentation and patient guidebook. To enhance the learning environment the use of posters, models and videos may be added. At the conclusion of this workshop, patients will understand the importance of their sensorimotor skills (vision, proprioception, and the vestibular system) in maintaining their ability to balance as they age. Patients will apply a variety of balancing exercises that are appropriate for their current level of function. Patients will understand the common causes for poor balance, possible solutions to these problems, and ways to modify their physical environment in order to minimize their fall risk. The purpose of this lesson is to teach patients about the importance of maintaining  balance as they age and ways to minimize their risk of falling.  WORKSHOPS   Nutrition:  Fueling a Scientist, research (physical sciences) led group instruction and group discussion with PowerPoint presentation and patient guidebook. To enhance the learning environment the use of posters, models and videos may be added. Patients will review the foundational principles of the [place] and understand what constitutes a serving size in each of the food groups. Patients will also learn Pritikin-friendly foods that are better choices when away from home and review make-ahead meal and snack options. Calorie density will be reviewed and applied to three nutrition priorities: weight maintenance, weight loss, and weight gain. The purpose of this lesson is to reinforce (in a group setting) the key concepts around what patients are recommended to eat and how to apply these guidelines when away from home by planning and selecting Pritikin-friendly options. Patients will understand how calorie density may be adjusted for different weight management goals.  Mindful Eating  Clinical staff led group instruction and group discussion with PowerPoint presentation and patient guidebook. To enhance the learning environment the use of posters, models and videos may be added. Patients will briefly review the concepts of the [place] and the importance of low-calorie dense foods. The concept of mindful eating will be introduced as well  as the importance of paying attention to internal hunger signals. Triggers for non-hunger eating and techniques for dealing with triggers will be explored. The purpose of this lesson is to provide patients with the opportunity to review the basic principles of the [place], discuss the value of eating mindfully and how to measure internal cues of hunger and fullness using the Hunger Scale. Patients will also discuss reasons for non-hunger eating and learn strategies to use  for controlling emotional eating.  Targeting Your Nutrition Priorities Clinical staff led group instruction and group discussion with PowerPoint presentation and patient guidebook. To enhance the learning environment the use of posters, models and videos may be added. Patients will learn how to determine their genetic susceptibility to disease by reviewing their family history. Patients will gain insight into the importance of diet as part of an overall healthy lifestyle in mitigating the impact of genetics and other environmental insults. The purpose of this lesson is to provide patients with the opportunity to assess their personal nutrition priorities by looking at their family history, their own health history and current risk factors. Patients will also be able to discuss ways of prioritizing and modifying the [place] for their highest risk areas  Menu  Clinical staff led group instruction and group discussion with PowerPoint presentation and patient guidebook. To enhance the learning environment the use of posters, models and videos may be added. Using menus brought in from [employer], or printed from [employer], patients will apply the [place] dining out guidelines that were presented in the [employer] video. Patients will also be able to practice these guidelines in a variety of provided scenarios. The purpose of this lesson is to provide patients with the opportunity to practice hands-on learning of the [place] with actual menus and practice scenarios.  Label Reading Clinical staff led group instruction and group discussion with PowerPoint presentation and patient guidebook. To enhance the learning environment the use of posters, models and videos may be added. Patients will review and discuss the [facility] label reading guidelines presented in [facility]'s Label Reading Educational series video. Using fool labels brought in from local  grocery stores and markets, patients will apply the label reading guidelines and determine if the packaged food meet the [facility] guidelines. The purpose of this lesson is to provide patients with the opportunity to review, discuss, and practice hands-on learning of the [facility] Label Reading guidelines with actual packaged food labels. [place] Workshops are designed to teach patients ways to prepare quick, simple, and affordable recipes at home. The importance of nutrition's role in chronic disease risk reduction is reflected in its emphasis in the overall [facility] program. By learning how to prepare essential core [facility] Eating Plan recipes, patients will increase control over what they eat; be able to customize the flavor of foods without the use of added salt, sugar, or fat; and improve the quality of the food they consume. By learning a set of core recipes which are easily assembled, quickly prepared, and affordable, patients are more likely to prepare more healthy foods at home. These workshops focus on convenient breakfasts, simple entres, side dishes, and desserts which can be prepared with minimal effort and are consistent with nutrition recommendations for cardiovascular risk reduction. Cooking [employer] are taught by a Engineer, materials (RD) who has been trained by the [employer]. The chef or RD has a clear understanding of the  importance of minimizing - if not completely eliminating - added fat, sugar, and sodium in recipes. Throughout the series of [place] Workshop sessions, patients will learn about healthy ingredients and efficient methods of cooking to build confidence in their capability to prepare    Cooking School weekly topics:  Adding Flavor- Sodium-Free  Fast and Healthy Breakfasts  Powerhouse Plant-Based Proteins  Satisfying Salads and Dressings  Simple Sides and Sauces  International Cuisine-Spotlight on  the [place] Zones  Delicious Desserts  Savory Soups  Efficiency Cooking - Meals in a Snap  Tasty Appetizers and Snacks  Comforting Weekend Breakfasts  One-Pot Wonders   Fast Evening Meals  Easy [place] (Psychosocial): New Thoughts, New Behaviors Clinical staff led group instruction and group discussion with PowerPoint presentation and patient guidebook. To enhance the learning environment the use of posters, models and videos may be added. Patients will learn and practice techniques for developing effective health and lifestyle goals. Patients will be able to effectively apply the goal setting process learned to develop at least one new personal goal.  The purpose of this lesson is to expose patients to a new skill set of behavior modification techniques such as techniques setting SMART goals, overcoming barriers, and achieving new thoughts and new behaviors.  Managing Moods and Relationships Clinical staff led group instruction and group discussion with PowerPoint presentation and patient guidebook. To enhance the learning environment the use of posters, models and videos may be added. Patients will learn how emotional and chronic stress factors can impact their health and relationships. They will learn healthy ways to manage their moods and utilize positive coping mechanisms. In addition, ICR patients will learn ways to improve communication skills. The purpose of this lesson is to expose patients to ways of understanding how one's mood and health are intimately connected. Developing a healthy outlook can help build positive relationships and connections with others. Patients will understand the importance of utilizing effective communication skills that include actively listening and being heard. They will learn and understand the importance of the "4 Cs" and especially Connections in fostering of a Healthy Mind-Set.  Healthy  Sleep for a Healthy Heart Clinical staff led group instruction and group discussion with PowerPoint presentation and patient guidebook. To enhance the learning environment the use of posters, models and videos may be added. At the conclusion of this workshop, patients will be able to demonstrate knowledge of the importance of sleep to overall health, well-being, and quality of life. They will understand the symptoms of, and treatments for, common sleep disorders. Patients will also be able to identify daytime and nighttime behaviors which impact sleep, and they will be able to apply these tools to help manage sleep-related challenges. The purpose of this lesson is to provide patients with a general overview of sleep and outline the importance of quality sleep. Patients will learn about a few of the most common sleep disorders. Patients will also be introduced to the concept of "sleep hygiene," and discover ways to self-manage certain sleeping problems through simple daily behavior changes. Finally, the workshop will motivate patients by clarifying the links between quality sleep and their goals of heart-healthy living.   Recognizing and Reducing Stress Clinical staff led group instruction and group discussion with PowerPoint presentation and patient guidebook. To enhance the learning environment the use of posters, models and videos may be added. At the conclusion of this workshop, patients will be able to understand the  types of stress reactions, differentiate between acute and chronic stress, and recognize the impact that chronic stress has on their health. They will also be able to apply different coping mechanisms, such as reframing negative self-talk. Patients will have the opportunity to practice a variety of stress management techniques, such as deep abdominal breathing, progressive muscle relaxation, and/or guided imagery.  The purpose of this lesson is to educate patients on the role of stress in their  lives and to provide healthy techniques for coping with it.  Learning Barriers/Preferences:  Learning Barriers/Preferences - 03/10/22 1226       Learning Barriers/Preferences   Learning Barriers Hearing;Sight   Wears glasses and hearing aids   Learning Preferences Computer/Internet;Pictoral;Skilled Demonstration             Education Topics:  Knowledge Questionnaire Score:  Knowledge Questionnaire Score - 03/10/22 1228       Knowledge Questionnaire Score   Pre Score 21/24             Core Components/Risk Factors/Patient Goals at Admission:  Personal Goals and Risk Factors at Admission - 03/10/22 1228       Core Components/Risk Factors/Patient Goals on Admission    Weight Management Yes;Weight Loss    Intervention Weight Management: Develop a combined nutrition and exercise program designed to reach desired caloric intake, while maintaining appropriate intake of nutrient and fiber, sodium and fats, and appropriate energy expenditure required for the weight goal.;Weight Management: Provide education and appropriate resources to help participant work on and attain dietary goals.;Weight Management/Obesity: Establish reasonable short term and long term weight goals.    Admit Weight 191 lb 5.8 oz (86.8 kg)    Expected Outcomes Short Term: Continue to assess and modify interventions until short term weight is achieved;Long Term: Adherence to nutrition and physical activity/exercise program aimed toward attainment of established weight goal;Weight Maintenance: Understanding of the daily nutrition guidelines, which includes 25-35% calories from fat, 7% or less cal from saturated fats, less than 277m cholesterol, less than 1.5gm of sodium, & 5 or more servings of fruits and vegetables daily;Weight Loss: Understanding of general recommendations for a balanced deficit meal plan, which promotes 1-2 lb weight loss per week and includes a negative energy balance of [phone]  kcal/d;Understanding recommendations for meals to include 15-35% energy as protein, 25-35% energy from fat, 35-60% energy from carbohydrates, less than 2021mof dietary cholesterol, 20-35 gm of total fiber daily;Understanding of distribution of calorie intake throughout the day with the consumption of 4-5 meals/snacks    Diabetes Yes    Intervention Provide education about signs/symptoms and action to take for hypo/hyperglycemia.;Provide education about proper nutrition, including hydration, and aerobic/resistive exercise prescription along with prescribed medications to achieve blood glucose in normal ranges: Fasting glucose 65-99 mg/dL    Expected Outcomes Short Term: Participant verbalizes understanding of the signs/symptoms and immediate care of hyper/hypoglycemia, proper foot care and importance of medication, aerobic/resistive exercise and nutrition plan for blood glucose control.;Long Term: Attainment of HbA1C < 7%.    Hypertension Yes    Intervention Provide education on lifestyle modifcations including regular physical activity/exercise, weight management, moderate sodium restriction and increased consumption of fresh fruit, vegetables, and low fat dairy, alcohol moderation, and smoking cessation.;Monitor prescription use compliance.    Expected Outcomes Short Term: Continued assessment and intervention until BP is < 140/9027mG in hypertensive participants. < 130/77m60m in hypertensive participants with diabetes, heart failure or chronic kidney disease.;Long Term: Maintenance of blood pressure at goal levels.  Lipids Yes    Intervention Provide education and support for participant on nutrition & aerobic/resistive exercise along with prescribed medications to achieve LDL <43m, HDL >468m    Expected Outcomes Short Term: Participant states understanding of desired cholesterol values and is compliant with medications prescribed. Participant is following exercise prescription and nutrition  guidelines.;Long Term: Cholesterol controlled with medications as prescribed, with individualized exercise RX and with personalized nutrition plan. Value goals: LDL < 7032mHDL > 40 mg.    Stress Yes    Intervention Offer individual and/or small group education and counseling on adjustment to heart disease, stress management and health-related lifestyle change. Teach and support self-help strategies.;Refer participants experiencing significant psychosocial distress to appropriate mental health specialists for further evaluation and treatment. When possible, include family members and significant others in education/counseling sessions.    Expected Outcomes Short Term: Participant demonstrates changes in health-related behavior, relaxation and other stress management skills, ability to obtain effective social support, and compliance with psychotropic medications if prescribed.;Long Term: Emotional wellbeing is indicated by absence of clinically significant psychosocial distress or social isolation.             Core Components/Risk Factors/Patient Goals Review:   Goals and Risk Factor Review     Row Name 03/15/22 0933 04/13/22 1713           Core Components/Risk Factors/Patient Goals Review   Personal Goals Review Weight Management/Obesity;Hypertension;Lipids;Diabetes;Stress Weight Management/Obesity;Hypertension;Lipids;Diabetes;Stress      Review RicLiliane Channelarted  intensive cardiac rehab on 03/14/22. Rick did well with exercise. Vital signs and CBG's were stable RicLiliane Channelas been doing  well with exercise. Vital signs and CBG's have been  stable      Expected Outcomes RicLiliane Channelll contiue to participate in phase 2 cardiac rehab for exercise, nutrition and lifestyle modifications RicLiliane Channelll contiue to participate intensive  cardiac rehab for exercise, nutrition and lifestyle modifications               Core Components/Risk Factors/Patient Goals at Discharge (Final Review):   Goals and Risk Factor  Review - 04/13/22 1713       Core Components/Risk Factors/Patient Goals Review   Personal Goals Review Weight Management/Obesity;Hypertension;Lipids;Diabetes;Stress    Review RicLiliane Channelas been doing  well with exercise. Vital signs and CBG's have been  stable    Expected Outcomes RicLiliane Channelll contiue to participate intensive  cardiac rehab for exercise, nutrition and lifestyle modifications             ITP Comments:  ITP Comments     Row Name 03/10/22 1520 03/15/22 0925 04/13/22 1710       ITP Comments Dr TraFransico Him, Medical Director Introduction to [facility] Intensive [facility]iac Re[facility] Program/ Initial [facility] Orientation Packet Reviewed with the patient and his wife. 30 Day ITP Review. RicLiliane Channelarted intensvie cardiac rehab on 03/14/22. Rick did well with exercise. 30 Day ITP Review. RicLiliane Channels good attendance and participation in  intensvie cardiac rehab              Comments: See ITP comments.MarHarrell Gave BSN

## 2022-04-15 ENCOUNTER — Encounter (HOSPITAL_COMMUNITY): Payer: No Typology Code available for payment source

## 2022-04-15 ENCOUNTER — Encounter (HOSPITAL_COMMUNITY)
Admission: RE | Admit: 2022-04-15 | Discharge: 2022-04-15 | Disposition: A | Discharge status: Home / Self Care | Payer: No Typology Code available for payment source | Source: Ambulatory Visit | Attending: Cardiology | Admitting: Cardiology

## 2022-04-15 DIAGNOSIS — Z48812 Encounter for surgical aftercare following surgery on the circulatory system: Secondary | ICD-10-CM | POA: Diagnosis not present

## 2022-04-15 DIAGNOSIS — Z955 Presence of coronary angioplasty implant and graft: Secondary | ICD-10-CM

## 2022-04-18 ENCOUNTER — Encounter (HOSPITAL_COMMUNITY): Payer: No Typology Code available for payment source

## 2022-04-20 ENCOUNTER — Encounter (HOSPITAL_COMMUNITY): Payer: No Typology Code available for payment source

## 2022-04-20 ENCOUNTER — Encounter (HOSPITAL_COMMUNITY)
Admission: RE | Admit: 2022-04-20 | Discharge: 2022-04-20 | Disposition: A | Discharge status: Home / Self Care | Payer: No Typology Code available for payment source | Source: Ambulatory Visit | Attending: Cardiology | Admitting: Cardiology

## 2022-04-20 DIAGNOSIS — Z955 Presence of coronary angioplasty implant and graft: Secondary | ICD-10-CM | POA: Insufficient documentation

## 2022-04-22 ENCOUNTER — Encounter (HOSPITAL_COMMUNITY)
Admission: RE | Admit: 2022-04-22 | Discharge: 2022-04-22 | Disposition: A | Discharge status: Home / Self Care | Payer: No Typology Code available for payment source | Source: Ambulatory Visit | Attending: Cardiology | Admitting: Cardiology

## 2022-04-22 ENCOUNTER — Encounter (HOSPITAL_COMMUNITY): Payer: No Typology Code available for payment source

## 2022-04-22 DIAGNOSIS — Z955 Presence of coronary angioplasty implant and graft: Secondary | ICD-10-CM

## 2022-04-22 LAB — GLUCOSE, CAPILLARY
Glucose-Capillary: 104 mg/dL — ABNORMAL HIGH (ref 70–99)
Glucose-Capillary: 103 mg/dL — ABNORMAL HIGH (ref 70–99)
Glucose-Capillary: 93 mg/dL (ref 70–99)

## 2022-04-25 ENCOUNTER — Encounter (HOSPITAL_COMMUNITY): Payer: No Typology Code available for payment source

## 2022-04-25 ENCOUNTER — Encounter (HOSPITAL_COMMUNITY)
Admission: RE | Admit: 2022-04-25 | Discharge: 2022-04-25 | Disposition: A | Discharge status: Home / Self Care | Payer: No Typology Code available for payment source | Source: Ambulatory Visit | Attending: Cardiology | Admitting: Cardiology

## 2022-04-25 DIAGNOSIS — Z955 Presence of coronary angioplasty implant and graft: Secondary | ICD-10-CM

## 2022-04-25 LAB — GLUCOSE, CAPILLARY: Glucose-Capillary: 167 mg/dL — ABNORMAL HIGH (ref 70–99)

## 2022-04-27 ENCOUNTER — Encounter (HOSPITAL_COMMUNITY): Payer: No Typology Code available for payment source

## 2022-04-27 ENCOUNTER — Encounter (HOSPITAL_COMMUNITY)
Admission: RE | Admit: 2022-04-27 | Discharge: 2022-04-27 | Disposition: A | Discharge status: Home / Self Care | Payer: No Typology Code available for payment source | Source: Ambulatory Visit | Attending: Cardiology | Admitting: Cardiology

## 2022-04-27 DIAGNOSIS — Z955 Presence of coronary angioplasty implant and graft: Secondary | ICD-10-CM | POA: Diagnosis not present

## 2022-04-29 ENCOUNTER — Encounter (HOSPITAL_COMMUNITY)
Admission: RE | Admit: 2022-04-29 | Discharge: 2022-04-29 | Disposition: A | Discharge status: Home / Self Care | Payer: No Typology Code available for payment source | Source: Ambulatory Visit | Attending: Cardiology | Admitting: Cardiology

## 2022-04-29 ENCOUNTER — Encounter (HOSPITAL_COMMUNITY): Payer: No Typology Code available for payment source

## 2022-04-29 VITALS — Ht 69.25 in | Wt 186.5 lb

## 2022-04-29 DIAGNOSIS — Z955 Presence of coronary angioplasty implant and graft: Secondary | ICD-10-CM | POA: Diagnosis not present

## 2022-05-02 ENCOUNTER — Encounter (HOSPITAL_COMMUNITY): Payer: No Typology Code available for payment source

## 2022-05-02 ENCOUNTER — Encounter (HOSPITAL_COMMUNITY)
Admission: RE | Admit: 2022-05-02 | Discharge: 2022-05-02 | Disposition: A | Discharge status: Home / Self Care | Payer: No Typology Code available for payment source | Source: Ambulatory Visit | Attending: Cardiology | Admitting: Cardiology

## 2022-05-02 DIAGNOSIS — Z955 Presence of coronary angioplasty implant and graft: Secondary | ICD-10-CM | POA: Diagnosis not present

## 2022-05-03 ENCOUNTER — Telehealth (HOSPITAL_COMMUNITY): Payer: Self-pay

## 2022-05-04 ENCOUNTER — Encounter (HOSPITAL_COMMUNITY): Payer: No Typology Code available for payment source

## 2022-05-06 ENCOUNTER — Encounter (HOSPITAL_COMMUNITY)
Admission: RE | Admit: 2022-05-06 | Discharge: 2022-05-06 | Disposition: A | Discharge status: Home / Self Care | Payer: No Typology Code available for payment source | Source: Ambulatory Visit | Attending: Cardiology | Admitting: Cardiology

## 2022-05-06 ENCOUNTER — Encounter (HOSPITAL_COMMUNITY): Payer: No Typology Code available for payment source

## 2022-05-06 DIAGNOSIS — Z955 Presence of coronary angioplasty implant and graft: Secondary | ICD-10-CM | POA: Diagnosis not present

## 2022-05-09 ENCOUNTER — Encounter (HOSPITAL_COMMUNITY)
Admission: RE | Admit: 2022-05-09 | Discharge: 2022-05-09 | Disposition: A | Discharge status: Home / Self Care | Payer: No Typology Code available for payment source | Source: Ambulatory Visit | Attending: Cardiology | Admitting: Cardiology

## 2022-05-09 DIAGNOSIS — Z955 Presence of coronary angioplasty implant and graft: Secondary | ICD-10-CM

## 2022-05-11 ENCOUNTER — Encounter (HOSPITAL_COMMUNITY)
Admission: RE | Admit: 2022-05-11 | Discharge: 2022-05-11 | Disposition: A | Discharge status: Home / Self Care | Payer: No Typology Code available for payment source | Source: Ambulatory Visit | Attending: Cardiology | Admitting: Cardiology

## 2022-05-11 DIAGNOSIS — Z955 Presence of coronary angioplasty implant and graft: Secondary | ICD-10-CM

## 2022-05-11 NOTE — Progress Notes (Signed)
Discharge Progress Report  Patient Details  Name: Alex Mccarty MRN: [id number] Date of Birth: [birth year] Referring Provider:   Flowsheet Row INTENSIVE CARDIAC REHAB ORIENT from 03/10/2022 in [place]  Referring Provider Candee Furbish, MD        Number of Visits: 25  Reason for Discharge:  Patient reached a stable level of exercise. Patient independent in their exercise. Patient has met program and personal goals.  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  ADL [facility]:   Initial Exercise Prescription:  Initial Exercise Prescription - 03/10/22 1200       Date of Initial Exercise RX and Referring Provider   Date 03/10/22    Referring Provider Candee Furbish, MD    Expected Discharge Date 05/06/22      Recumbant Bike   Level 2    RPM 60    Watts 35    Minutes 15    METs 3.3      NuStep   Level 2    SPM 75    Minutes 15    METs 3.3      Prescription Details   Frequency (times per week) 3    Duration Progress to 30 minutes of continuous aerobic without signs/symptoms of physical distress      Intensity   THRR 40-80% of Max Heartrate 59-118    Ratings of Perceived Exertion 11-13    Perceived Dyspnea 0-4      Progression   Progression Continue progressive overload as per policy without signs/symptoms or physical distress.      Resistance Training   Training Prescription Yes    Weight 3 lbs    Reps 10-15             Discharge Exercise Prescription (Final Exercise Prescription Changes):  Exercise Prescription Changes - 05/11/22 1621       Response to Exercise   Blood Pressure (Admit) 122/60    Blood Pressure (Exercise) 130/66    Blood Pressure (Exit) 118/60    Heart Rate (Admit) 71 bpm    Heart Rate (Exercise) 94 bpm    Heart Rate (Exit) 70 bpm    Rating  of Perceived Exertion (Exercise) 12    Perceived Dyspnea (Exercise) 0    Symptoms None    Comments Pt graduated the CRP2 program    Duration Continue with 30 min of aerobic exercise without signs/symptoms of physical distress.    Intensity THRR unchanged      Progression   Progression Continue to progress workloads to maintain intensity without signs/symptoms of physical distress.    Average METs 3      Resistance Training   Training Prescription No      Recumbant Bike   Level 4.5    RPM 80    Minutes 15    METs 2.8      NuStep   Level 5    SPM 90    Minutes 15    METs 3.2      Home Exercise Plan   Plans to continue exercise at Home (comment)    Frequency Add 2 additional days to program exercise sessions.    Initial Home Exercises Provided 03/23/22             Functional Capacity:  6 Minute  Walk     Row Name 03/10/22 1103 04/29/22 1658       6 Minute Walk   Phase Initial Discharge    Distance 1531 feet 1785 feet    Distance % Change -- 16.59 %    Distance Feet Change -- 254 ft    Walk Time 6 minutes 6 minutes    # of Rest Breaks 0 0    MPH 2.9 3.38    METS 3.39 3.93    RPE 12 12    Perceived Dyspnea  0 0    VO2 Peak 11.9 13.76    Symptoms No No    Resting HR 69 bpm 68 bpm    Resting BP 128/56 124/72    Resting Oxygen Saturation  96 % 100 %    Exercise Oxygen Saturation  during 6 min walk 99 % 100 %    Max Ex. HR 104 bpm 106 bpm    Max Ex. BP 158/54 162/60    2 Minute Post BP 138/56 142/62             Psychological, QOL, Others - Outcomes: PHQ 2/9:    05/11/2022    3:58 PM 05/11/2022    3:52 PM 03/10/2022    3:31 PM 09/03/2019    9:40 AM 12/07/2018   10:05 AM  Depression screen PHQ 2/9  Decreased Interest 0 0 0 0 0  Down, Depressed, Hopeless 0 0 0 0 0  PHQ - 2 Score 0 0 0 0 0    Quality of Life:  Quality of Life - 05/02/22 1410       Quality of Life   Select Quality of Life      Quality of Life Scores   Health/Function Post  25.83 %    Socioeconomic Post 25.5 %    Psych/Spiritual Post 24.86 %    Family Post 23 %    GLOBAL Post 25.16 %             Personal Goals: Goals established at orientation with interventions provided to work toward goal.  Personal Goals and Risk Factors at Admission - 03/10/22 1228       Core Components/Risk Factors/Patient Goals on Admission    Weight Management Yes;Weight Loss    Intervention Weight Management: Develop a combined nutrition and exercise program designed to reach desired caloric intake, while maintaining appropriate intake of nutrient and fiber, sodium and fats, and appropriate energy expenditure required for the weight goal.;Weight Management: Provide education and appropriate resources to help participant work on and attain dietary goals.;Weight Management/Obesity: Establish reasonable short term and long term weight goals.    Admit Weight 191 lb 5.8 oz (86.8 kg)    Expected Outcomes Short Term: Continue to assess and modify interventions until short term weight is achieved;Long Term: Adherence to nutrition and physical activity/exercise program aimed toward attainment of established weight goal;Weight Maintenance: Understanding of the daily nutrition guidelines, which includes 25-35% calories from fat, 7% or less cal from saturated fats, less than 287m cholesterol, less than 1.5gm of sodium, & 5 or more servings of fruits and vegetables daily;Weight Loss: Understanding of general recommendations for a balanced deficit meal plan, which promotes 1-2 lb weight loss per week and includes a negative energy balance of [phone] kcal/d;Understanding recommendations for meals to include 15-35% energy as protein, 25-35% energy from fat, 35-60% energy from carbohydrates, less than 2061mof dietary cholesterol, 20-35 gm of total fiber daily;Understanding of distribution of calorie intake throughout the day  with the consumption of 4-5 meals/snacks    Diabetes Yes    Intervention  Provide education about signs/symptoms and action to take for hypo/hyperglycemia.;Provide education about proper nutrition, including hydration, and aerobic/resistive exercise prescription along with prescribed medications to achieve blood glucose in normal ranges: Fasting glucose 65-99 mg/dL    Expected Outcomes Short Term: Participant verbalizes understanding of the signs/symptoms and immediate care of hyper/hypoglycemia, proper foot care and importance of medication, aerobic/resistive exercise and nutrition plan for blood glucose control.;Long Term: Attainment of HbA1C < 7%.    Hypertension Yes    Intervention Provide education on lifestyle modifcations including regular physical activity/exercise, weight management, moderate sodium restriction and increased consumption of fresh fruit, vegetables, and low fat dairy, alcohol moderation, and smoking cessation.;Monitor prescription use compliance.    Expected Outcomes Short Term: Continued assessment and intervention until BP is < 140/58m HG in hypertensive participants. < 130/815mHG in hypertensive participants with diabetes, heart failure or chronic kidney disease.;Long Term: Maintenance of blood pressure at goal levels.    Lipids Yes    Intervention Provide education and support for participant on nutrition & aerobic/resistive exercise along with prescribed medications to achieve LDL <7022mHDL >61m47m  Expected Outcomes Short Term: Participant states understanding of desired cholesterol values and is compliant with medications prescribed. Participant is following exercise prescription and nutrition guidelines.;Long Term: Cholesterol controlled with medications as prescribed, with individualized exercise RX and with personalized nutrition plan. Value goals: LDL < 70mg78mL > 40 mg.    Stress Yes    Intervention Offer individual and/or small group education and counseling on adjustment to heart disease, stress management and health-related lifestyle  change. Teach and support self-help strategies.;Refer participants experiencing significant psychosocial distress to appropriate mental health specialists for further evaluation and treatment. When possible, include family members and significant others in education/counseling sessions.    Expected Outcomes Short Term: Participant demonstrates changes in health-related behavior, relaxation and other stress management skills, ability to obtain effective social support, and compliance with psychotropic medications if prescribed.;Long Term: Emotional wellbeing is indicated by absence of clinically significant psychosocial distress or social isolation.              Personal Goals Discharge:  Goals and Risk Factor Review     Row Name 03/15/22 0933 04/13/22 1713 05/11/22 1555         Core Components/Risk Factors/Patient Goals Review   Personal Goals Review Weight Management/Obesity;Hypertension;Lipids;Diabetes;Stress Weight Management/Obesity;Hypertension;Lipids;Diabetes;Stress Weight Management/Obesity;Hypertension;Lipids;Diabetes;Stress     Review Alex Alex Channelted  intensive cardiac rehab on 03/14/22. Alex did well with exercise. Vital signs and CBG's were stable Alex Mccarty been doing  well with exercise. Vital signs and CBG's have been  stable Alex Mccarty been doing  well with exercise. Vital signs and CBG's have been stable. Alex Mccarty complete intensvie cardiac rehab today.     Expected Outcomes Alex Mccarty contiue to participate in phase 2 cardiac rehab for exercise, nutrition and lifestyle modifications Alex Mccarty contiue to participate intensive  cardiac rehab for exercise, nutrition and lifestyle modifications Alex Mccarty contiue to exercise,follow  nutrition and lifestyle modifications upon completion of intensive cardiac rehab              Exercise Goals and Review:  Exercise Goals     Row Name 03/10/22 1216             Exercise Goals   Increase Physical Activity Yes       Intervention  Provide advice, education, support  and counseling about physical activity/exercise needs.;Develop an individualized exercise prescription for aerobic and resistive training based on initial evaluation findings, risk stratification, comorbidities and participant's personal goals.       Expected Outcomes Short Term: Attend rehab on a regular basis to increase amount of physical activity.;Long Term: Add in home exercise to make exercise part of routine and to increase amount of physical activity.;Long Term: Exercising regularly at least 3-5 days a week.       Increase Strength and Stamina Yes       Intervention Provide advice, education, support and counseling about physical activity/exercise needs.;Develop an individualized exercise prescription for aerobic and resistive training based on initial evaluation findings, risk stratification, comorbidities and participant's personal goals.       Expected Outcomes Short Term: Increase workloads from initial exercise prescription for resistance, speed, and METs.;Short Term: Perform resistance training exercises routinely during rehab and add in resistance training at home;Long Term: Improve cardiorespiratory fitness, muscular endurance and strength as measured by increased METs and functional capacity (6MWT)       Able to understand and use rate of perceived exertion (RPE) scale Yes       Intervention Provide education and explanation on how to use RPE scale       Expected Outcomes Short Term: Able to use RPE daily in rehab to express subjective intensity level;Long Term:  Able to use RPE to guide intensity level when exercising independently       Knowledge and understanding of Target Heart Rate Range (THRR) Yes       Intervention Provide education and explanation of THRR including how the numbers were predicted and where they are located for reference       Expected Outcomes Short Term: Able to state/look up THRR;Short Term: Able to use daily as guideline for  intensity in rehab;Long Term: Able to use THRR to govern intensity when exercising independently       Understanding of Exercise Prescription Yes       Intervention Provide education, explanation, and written materials on patient's individual exercise prescription       Expected Outcomes Short Term: Able to explain program exercise prescription;Long Term: Able to explain home exercise prescription to exercise independently                Exercise Goals Re-Evaluation:  Exercise Goals Re-Evaluation     Row Name 03/14/22 1425 03/23/22 1628 05/02/22 1650 05/11/22 1624       Exercise Goal Re-Evaluation   Exercise Goals Review Increase Physical Activity;Increase Strength and Stamina;Able to understand and use rate of perceived exertion (RPE) scale;Knowledge and understanding of Target Heart Rate Range (THRR);Understanding of Exercise Prescription Increase Physical Activity;Increase Strength and Stamina;Able to understand and use rate of perceived exertion (RPE) scale;Knowledge and understanding of Target Heart Rate Range (THRR);Understanding of Exercise Prescription Increase Physical Activity;Increase Strength and Stamina;Able to understand and use rate of perceived exertion (RPE) scale;Knowledge and understanding of Target Heart Rate Range (THRR);Understanding of Exercise Prescription Increase Physical Activity;Increase Strength and Stamina;Able to understand and use rate of perceived exertion (RPE) scale;Knowledge and understanding of Target Heart Rate Range (THRR);Understanding of Exercise Prescription    Comments Pt's first day in the CRP2 program. Pt understands the exercise Rx, RPE scale and THRR Reviewed MET's, goals and home ExRx with pt today. Pt is tolerating exercise well with an average MET level of 2.5. Pt will continue to [facility] on [facility] by walking and using dumbells  with our wts packet 2-3 days for 30-45 mins. Pt feels good about his goals of being less  fatigue and is working on wt loss, pt is also happy about being in a regular exercise routine and getting back to his normal gym as well. Reviewed MET's and goals. Pt is tolerating exercise well with an average MET level of 3.1. Pt feels good about his goals. Pt feels good about his goal of losing wt, decrease fatigue, and getting back into regular exercise. Pt graduated the CRP2 program. Pt tolerated exercise well with an average MET level of 3. Pt was very thankful for the program and really enjoyed his time here. Pt will continue to exercise on his own at his local fit[facility] center 3-6 days for 30-45 mins per session    Expected Outcomes Will cotniue to monitor pt and progress exercise workloads as tolerated. Will cotniue to monitor pt and progress exercise workloads as tolerated. Will continue to monitor pt and progress exercise workloads as tolerated. Pt will continue to exercise on his own and gain strength.             Nutrition & Weight - Outcomes:  Pre Biometrics - 03/10/22 1015       Pre Biometrics   Waist Circumference 41 inches    Hip Circumference 41 inches    Waist to Hip Ratio 1 %    Triceps Skinfold 13 mm    % Body Fat 27.6 %    Grip Strength 40 kg    Flexibility --   Not performed due to back problems   Single Leg Stand 10.2 seconds             Post Biometrics - 04/29/22 1659        Post  Biometrics   Height 5' 9.25" (1.759 m)    Weight 84.6 kg    Waist Circumference 41 inches    Hip Circumference 40.25 inches    Waist to Hip Ratio 1.02 %    BMI (Calculated) 27.34    Triceps Skinfold 16 mm    % Body Fat 28.2 %    Grip Strength 48 kg    Flexibility 9 in    Single Leg Stand 6.7 seconds             Nutrition:  Nutrition Therapy & Goals - 05/06/22 1622       Nutrition Therapy   Diet Heart Healthy/Carbohydrate Consistent/Renal Diet    Drug/Food Interactions Statins/Certain Fruits    Protein (specify units) 52-69g (0.6-0.8g/kg actual body weight)       Personal Nutrition Goals   Nutrition Goal Patient to choose a variety of fruits, vegetables, whole grains, lean protein/plant protein, nonfat dairy daily as part of heart healthy diet    Personal Goal #2 Patient to limit sodium intake to <1512m per day    Personal Goal #3 Patient to identify and limit daily intake of saturated fat, trans fat, sodium, and refined carbohydrates    Comments Goals in progress. [employer]next week. He is incredibly motivated to adhere to [place] heart healthy diet with renal recommendations. He has reduced fast food, refined carbohydrates, saturated fat, and sodium.      Intervention Plan   Intervention Prescribe, educate and counsel regarding individualized specific dietary modifications aiming towards targeted core components such as weight, hypertension, lipid management, diabetes, heart failure and other comorbidities.    Expected Outcomes Short Term Goal: Understand basic principles of dietary content, such as calories, fat, sodium,  cholesterol and nutrients.;Long Term Goal: Adherence to prescribed nutrition plan.             Nutrition Discharge:  Nutrition Assessments - 05/03/22 1501       Rate Your Plate Scores   Post Score 80             Education Questionnaire Score:  Knowledge Questionnaire Score - 05/02/22 1412       Knowledge Questionnaire Score   Post Score 20/24             Goals reviewed with patient; copy given to patient.Pt graduates from  Intensive/Traditional cardiac rehab program on 05/11/22  with completion of  25 exercise and education sessions. Pt maintained good attendance and progressed nicely during their participation in rehab as evidenced by increased MET level.   Medication list reconciled. Repeat  PHQ score-0  .  Pt has made significant lifestyle changes and should be commended for their success. Alex Mccarty achieved their goals during cardiac rehab.   Pt plans to continue exercise at the [facility]  3 days a week with his friend. Alex increased his distance on his post exercise walk test by 254 feet.Harrell Gave RN BSN

## 2022-05-19 ENCOUNTER — Encounter: Payer: Self-pay | Admitting: Cardiology

## 2022-05-25 ENCOUNTER — Encounter: Payer: Self-pay | Admitting: Cardiology

## 2022-06-07 ENCOUNTER — Encounter (INDEPENDENT_AMBULATORY_CARE_PROVIDER_SITE_OTHER): Payer: Self-pay | Admitting: Ophthalmology

## 2022-06-07 ENCOUNTER — Ambulatory Visit (INDEPENDENT_AMBULATORY_CARE_PROVIDER_SITE_OTHER): Payer: HMO | Admitting: Ophthalmology

## 2022-06-07 DIAGNOSIS — E113412 Type 2 diabetes mellitus with severe nonproliferative diabetic retinopathy with macular edema, left eye: Secondary | ICD-10-CM

## 2022-06-07 DIAGNOSIS — H35341 Macular cyst, hole, or pseudohole, right eye: Secondary | ICD-10-CM | POA: Diagnosis not present

## 2022-06-07 DIAGNOSIS — E113411 Type 2 diabetes mellitus with severe nonproliferative diabetic retinopathy with macular edema, right eye: Secondary | ICD-10-CM

## 2022-06-07 NOTE — Assessment & Plan Note (Signed)
OD,, no current Csme, will monitor and observe

## 2022-06-07 NOTE — Progress Notes (Signed)
06/07/2022     CHIEF COMPLAINT Patient presents for  Chief Complaint  Patient presents with   Diabetic Retinopathy with Macular Edema      HISTORY OF PRESENT ILLNESS: Alex Mccarty is a 76 y.o. 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      Referring physician: Clinic, [facility] [place],  [place] [address]  HISTORICAL INFORMATION:   Selected notes from the MEDICAL RECORD NUMBER    Lab Results  Component Value Date   HGBA1C 6.6 (H) 05/01/2021     CURRENT MEDICATIONS: No current outpatient medications on file. (Ophthalmic Drugs)   No current facility-administered medications for this visit. (Ophthalmic Drugs)   Current Outpatient Medications (Other)  Medication Sig   acetaminophen (TYLENOL) 500 MG tablet Take 1,000 mg by mouth every 6 (six) hours as needed for moderate pain.   amLODipine (NORVASC) 2.5 MG tablet Take 2.5 mg by mouth daily.   aspirin 81 MG EC tablet Take 81 mg by mouth at bedtime.   atorvastatin (LIPITOR) 80 MG tablet Take 80 mg by mouth at bedtime.    calcium elemental as carbonate (TUMS ULTRA 1000) 400 MG chewable tablet Chew 2,000 mg by mouth daily as needed for heartburn.   Cholecalciferol (VITAMIN D3) 50 MCG (2000 UT) TABS Take 2,000 Units by mouth daily.   clopidogrel (PLAVIX) 75 MG tablet Take 75 mg by mouth daily.   cyclobenzaprine (FLEXERIL) 10 MG tablet Take 10 mg by mouth as needed (muscle pain).    gabapentin (NEURONTIN) 300 MG capsule Take 300 mg by mouth at bedtime.   Glucosamine-Chondroitin (COSAMIN DS PO) Take 1 capsule by mouth at bedtime.   LANTUS SOLOSTAR 100 UNIT/ML Solostar Pen Inject 38 Units into the skin every morning.   losartan (COZAAR) 100 MG tablet Take  100 mg by mouth at bedtime.   nitroGLYCERIN (NITROSTAT) 0.4 MG SL tablet Place 1 tablet (0.4 mg total) under the tongue every 5 (five) minutes as needed for chest pain.   pantoprazole (PROTONIX) 40 MG tablet Take 1 tablet (40 mg total) by mouth daily.   primidone (MYSOLINE) 50 MG tablet Take 50 mg by mouth at bedtime.   propranolol (INDERAL) 20 MG tablet Take 20-40 mg by mouth every morning. Take 40 mg in the morning and 20 mg at lunch time   tiZANidine (ZANAFLEX) 4 MG tablet Take 4 mg by mouth every 6 (six) hours as needed for muscle spasms.   triamcinolone cream (KENALOG) 0.1 % Apply 1 Application topically 2 (two) times daily as needed (psoriasis).   zinc gluconate 50 MG tablet Take 50 mg by mouth in the morning.   No current facility-administered medications for this visit. (Other)      REVIEW OF SYSTEMS: ROS   Negative for: Constitutional, Gastrointestinal, Neurological, Skin, Genitourinary, Musculoskeletal, HENT, Endocrine, Cardiovascular, Eyes, Respiratory, Psychiatric, Allergic/Imm, Heme/Lymph Last edited by Morene Rankins, CMA on 06/07/2022  1:17 PM.       ALLERGIES Allergies  Allergen Reactions   Ticagrelor Shortness Of Breath   Codeine Nausea And Vomiting   Hydrocodone Nausea And Vomiting   Hydromorphone     Confusion (intolerance) Hallucinations   Penicillins Other (See Comments)  Childhood reaction Has patient had a PCN reaction causing immediate rash, facial/tongue/throat swelling, SOB or lightheadedness with hypotension: Unknown Has patient had a PCN reaction causing severe rash involving mucus membranes or skin necrosis: Unknown Has patient had a PCN reaction that required hospitalization: Unknown Has patient had a PCN reaction occurring within the last 10 years: Unknown If all of the above answers are "NO", then may proceed with Cephalosporin use.     Sulfa Antibiotics Nausea And Vomiting    PAST MEDICAL HISTORY Past Medical History:  Diagnosis Date    Arthritis    Bowel obstruction (HCC)    CKD (chronic kidney disease)    unkknown stage per patient   Coronary artery disease    Diabetes mellitus ([place])    Hyperlipidemia    Hypertension    Kidney stones    Past Surgical History:  Procedure Laterality Date   CARDIAC CATHETERIZATION     COLONOSCOPY  10/2015   Dr Kenton Kingfisher   COLOSTOMY REVISION Right 09/29/2017   Procedure: COLON RESECTION RIGHT;  Surgeon: Jovita Kussmaul, MD;  Location: [facility];  Service: General;  Laterality: Right;   CORONARY STENT INTERVENTION N/A 01/28/2022   Procedure: CORONARY STENT INTERVENTION;  Surgeon: Jettie Booze, MD;  Location: [place];  Service: Cardiovascular;  Laterality: N/A;   ELBOW SURGERY     ulnar nerve repair   ESOPHAGOGASTRODUODENOSCOPY     same time    FOOT SURGERY     crushed heel   INTRAVASCULAR ULTRASOUND/IVUS N/A 01/28/2022   Procedure: Intravascular Ultrasound/IVUS;  Surgeon: Jettie Booze, MD;  Location: [facility];  Service: Cardiovascular;  Laterality: N/A;   LAPAROTOMY N/A 09/29/2017   Procedure: EXPLORATORY LAPAROTOMY;  Surgeon: Jovita Kussmaul, MD;  Location: [facility];  Service: General;  Laterality: N/A;   LEFT HEART CATH N/A 01/28/2022   Procedure: Left Heart Cath;  Surgeon: Jettie Booze, MD;  Location: [facility];  Service: Cardiovascular;  Laterality: N/A;   LEFT HEART CATH AND CORONARY ANGIOGRAPHY N/A 01/21/2022   Procedure: LEFT HEART CATH AND CORONARY ANGIOGRAPHY;  Surgeon: Jettie Booze, MD;  Location: [facility];  Service: Cardiovascular;  Laterality: N/A;    FAMILY HISTORY Family History  Problem Relation Age of Onset   Kidney Stones Mother    Heart disease Mother    Colon cancer Neg Hx    Esophageal cancer Neg Hx     SOCIAL HISTORY Social History   Tobacco Use   Smoking status: Former   Smokeless tobacco: Never   Tobacco comments:    quit over 40 years ago.Around age [age]   Vaping Use    Vaping Use: Never used  Substance Use Topics   Alcohol use: Yes    Alcohol/week: 0.0 standard drinks of alcohol    Comment: occasional   Drug use: No         OPHTHALMIC EXAM:  Base Eye Exam     Visual Acuity (ETDRS)       Right Left   Dist cc 20/25 -1 20/20 -1    Correction: Glasses         Tonometry (Tonopen, 1:22 PM)       Right Left   Pressure 5 5         Pupils       Pupils   Right PERRL   Left PERRL         Visual Fields       Left  Right    Full Full         Extraocular Movement       Right Left    Ortho Ortho    -- -- --  --  --  -- -- --   -- -- --  --  --  -- -- --           Neuro/Psych     Oriented x3: Yes   Mood/Affect: Normal         Dilation     Both eyes: 1.0% Mydriacyl, 2.5% Phenylephrine @ 1:18 PM           Slit Lamp and Fundus Exam     External Exam       Right Left   External Normal Normal         Slit Lamp Exam       Right Left   Lids/Lashes Normal Normal   Conjunctiva/Sclera White and quiet White and quiet   Cornea Clear Clear   Anterior Chamber Deep and quiet Deep and quiet   Iris Round and reactive Round and reactive   Lens Centered posterior chamber intraocular lens Centered posterior chamber intraocular lens   Anterior Vitreous Normal Normal         Fundus Exam       Right Left   Posterior Vitreous Normal,  Normal   Disc Normal Normal   C/D Ratio 0.3 0.2   Macula Microaneurysms temporally, Mild clinically significant macular edema Microaneurysms, Mild clinically significant macular edema. temporally   Vessels NPDR-Severe, fewer posterior pole cotton-wool spots NPDR-Severe   Periphery Normal Normal            IMAGING AND PROCEDURES  Imaging and Procedures for 06/07/22  OCT, Retina - OU - Both Eyes       Right Eye Quality was good. Scan locations included subfoveal. Central Foveal Thickness: 273. Progression has been stable. Findings include vitreomacular adhesion .    Left Eye Quality was good. Scan locations included subfoveal. Central Foveal Thickness: 295. Progression has been stable. Findings include vitreomacular adhesion .   Notes OD with current VMT.  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  Watch less CSME  OS, much improved CSME, small area inferotemporal will observe        Color Fundus Photography Optos - OU - Both Eyes       Right Eye Disc findings include normal observations. Macula : edema, microaneurysms.   Left Eye Progression has no prior data. Disc findings include normal observations. Macula : edema, microaneurysms.   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  Overall with good acuity yet still with VMA on the fovea.  We will continue to monitor and observe     ICD-10-CM   1. Severe nonproliferative diabetic retinopathy of left eye, with macular edema, associated with type 2 diabetes mellitus (HCC)  F62.1308 OCT, Retina - OU - Both Eyes    Color Fundus Photography Optos - OU - Both Eyes    2.  Severe nonproliferative diabetic retinopathy of right eye, with macular edema, associated with type 2 diabetes mellitus ([place])  E11.3411     3. 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  OU, severe NPDR no interval change.  We will continue to monitor and observe.  2.  Early stage macular hole OD, with outer photoreceptor changes but yet still no progression.  With minor VMA still present this could spontaneously  resolve as PVD develops.  We will continue to monitor  3.  To report promptly new onset visual acuity decline in either eye.  If so macular hole could have formed.  Ophthalmic Meds Ordered this visit:  No orders of the defined types were placed in this encounter.      Return in about 6 months (around 12/06/2022) for DILATE OU, OCT.  There are no Patient Instructions on file for this visit.   Explained the diagnoses, plan, and follow up with the patient and they expressed understanding.  Patient expressed understanding of the importance of proper follow up care.   Clent Demark Srinika Delone M.D. Diseases & Surgery of the Retina and Vitreous [facility] 06/07/22     Abbreviations: M myopia (nearsighted); A astigmatism; H hyperopia (farsighted); P presbyopia; Mrx spectacle prescription;  CTL contact lenses; OD right eye; OS left eye; OU both eyes  XT exotropia; ET esotropia; PEK punctate epithelial keratitis; PEE punctate epithelial erosions; DES dry eye syndrome; MGD meibomian gland dysfunction; ATs artificial tears; PFAT's preservative free artificial tears; [place] nuclear sclerotic cataract; PSC posterior subcapsular cataract; ERM epi-retinal membrane; PVD posterior vitreous detachment; RD retinal detachment; DM diabetes mellitus; DR diabetic retinopathy; NPDR non-proliferative diabetic retinopathy; PDR proliferative diabetic retinopathy; CSME clinically significant macular edema; DME diabetic macular edema; dbh dot blot hemorrhages; CWS cotton wool spot; POAG primary open angle glaucoma; C/D cup-to-disc ratio; HVF humphrey visual field; GVF goldmann visual field; OCT optical coherence tomography; IOP intraocular pressure; BRVO Branch retinal vein occlusion; CRVO central retinal vein occlusion; CRAO central retinal artery occlusion; BRAO branch retinal artery occlusion; RT retinal tear; SB scleral buckle; PPV pars plana vitrectomy; VH Vitreous hemorrhage; PRP panretinal laser  photocoagulation; IVK intravitreal kenalog; VMT vitreomacular traction; MH Macular hole;  NVD neovascularization of the disc; NVE neovascularization elsewhere; AREDS age related eye disease study; ARMD age related macular degeneration; POAG primary open angle glaucoma; EBMD epithelial/anterior basement membrane dystrophy; ACIOL anterior chamber intraocular lens; IOL intraocular lens; PCIOL posterior chamber intraocular lens; Phaco/IOL phacoemulsification with intraocular lens placement; [place] photorefractive keratectomy; LASIK laser assisted in situ keratomileusis; HTN hypertension; DM diabetes mellitus; COPD chronic obstructive pulmonary disease

## 2022-06-07 NOTE — Assessment & Plan Note (Signed)
The outer photoreceptor layer disturbance did not progression over the last 4 months.  Overall with good acuity yet still with VMA on the fovea.  We will continue to monitor and observe

## 2022-06-27 ENCOUNTER — Telehealth: Payer: Self-pay | Admitting: Cardiology

## 2022-06-27 NOTE — Telephone Encounter (Signed)
Pt is requesting call back in regards to [facility] referrals. She says she is unsure how it works, but needs and extension and would like to discuss this. Please advice.

## 2022-06-30 NOTE — Telephone Encounter (Signed)
Spoke with [name] and advised pt will need to contact his primary MD at the [facility] to request further referrals. Pt was seen yesterday by his PCP at the [place] and they will go ahead and notify him pt has f/u here in December.  She will call back with any further questions or concerns.

## 2022-09-02 ENCOUNTER — Ambulatory Visit: Payer: No Typology Code available for payment source | Admitting: Cardiology

## 2022-10-21 ENCOUNTER — Encounter: Payer: Self-pay | Admitting: Cardiology

## 2022-10-21 ENCOUNTER — Ambulatory Visit: Payer: No Typology Code available for payment source | Attending: Cardiology | Admitting: Cardiology

## 2022-10-21 VITALS — BP 100/64 | HR 64 | Ht 69.0 in | Wt 180.0 lb

## 2022-10-21 DIAGNOSIS — K219 Gastro-esophageal reflux disease without esophagitis: Secondary | ICD-10-CM | POA: Diagnosis not present

## 2022-10-21 DIAGNOSIS — N184 Chronic kidney disease, stage 4 (severe): Secondary | ICD-10-CM

## 2022-10-21 DIAGNOSIS — I2510 Atherosclerotic heart disease of native coronary artery without angina pectoris: Secondary | ICD-10-CM

## 2022-10-21 NOTE — Progress Notes (Signed)
Cardiology Office Note:    Date:  10/21/2022   ID:  Alex Mccarty, DOB 12-01-1945, MRN [id number]  PCP:  Gerome Sam, MD   [facility] Providers Cardiologist:  Candee Furbish, MD     Referring MD: [facility], [facility]   History of Present Illness:    Alex Mccarty is a 77 y.o. male here for the follow-up of CAD, hypertension, and hyperlipidemia at the request of Cecilie Kicks, NP.  He saw Cecilie Kicks, NP on 02/04/2022. Per her notes:  LHC on 01/21/2022 showed 40% stenosis of ostial RCA followed by 90% stenosis of mid RCA and 25% stenosis of proximal LAD. 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 Given his CKD stage IV, case was discussed with Dr. Marlou Porch and CT surgeon and decision was made to bring him back for PCI of RCA. On 01/28/2022 he underwent successful PCI to RCA lesion x2 with planned DAPT for at least 6 months.  At his last appointment he reported DOE with minimal activity for the prior month.This was associated with a "funny feeling" or pain in his central chest. Last year he had an Echo and treadmill stress test. However, he was physically unable to complete the treadmill test. Then a nuclear stress test was performed, suggestive of coronary blockages. A heart cath was recommended. For years his blood pressure was averaging 867 systolic. However, in the previous 6 months it was increasing up to 619-509 systolic. They anticipated he would be restarted on amlodipine when he saw his nephrologist.  Previously, he has had 3 TIAs. His neurologist had started Plavix initially. They note his most recent TIA was very different compared to the others. The Plavix was thought to be ineffective, so it was switched to Brilinta but he developed an adverse reaction. He was switched back to Plavix.  He endorses kidney disease. Previous creatinine was reportedly 2.24-2.5.  He is a former smoker. He quit about 40  years ago, when he was in his 20's.  Previously underwent an emergency bowel resection.  [facility]  Today, he is accompanied by his wife. He states his main complaint today is chronic fatigue, constant all the time. He is not sure of the cause, but suspects it could be his medication. Also he reports that his blood pressures have improved lately. In clinic today his BP is 120/50. Currently he is back on amlodipine.  While in the hospital for his stents, he was instructed that he could no longer take omeprazole. Unfortunately, protonix is not working as well for him. He has needed to take Tums more frequently with his protonix. His acid reflux seems worse after eating foods with sugar, or mayonnaise.  They also present written records of his monitor results, personally reviewed and is reassuring. After seeing his nephrologist, his creatine was noted to be improved.  He denies any palpitations, chest pain, shortness of breath, or peripheral edema. No lightheadedness, headaches, syncope, orthopnea, or PND.  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  They had multiple questions today regarding his recent procedures and next steps. We reviewed these in detail and to their understanding and satisfaction.  There was concern about left main aneurysm.  Had lengthy discussion.  Risk of ligation and bypass would be extremely high.  Covered stent in that region would also be extremely high risk.  He had a family member who had an abdominal  aortic aneurysm that ruptured.  I will check an abdominal ultrasound on him to make sure he does not have any evidence of aortic aneurysm in that region.  Feels well. Recumbent bike.  Pain no syncope no bleeding.  Did have an episode of severe nausea and vomiting back in December after he had COVID.  Ended up in the hospital at [facility].  They did not do an EGD because he was on Plavix.  Ended up resolving.  Past Medical History:   Diagnosis Date   Arthritis    Bowel obstruction (HCC)    CKD (chronic kidney disease)    unkknown stage per patient   Coronary artery disease    Diabetes mellitus ([place])    Hyperlipidemia    Hypertension    Kidney stones     Past Surgical History:  Procedure Laterality Date   CARDIAC CATHETERIZATION     COLONOSCOPY  10/2015   Dr Kenton Kingfisher   COLOSTOMY REVISION Right 09/29/2017   Procedure: COLON RESECTION RIGHT;  Surgeon: Jovita Kussmaul, MD;  Location: [facility];  Service: General;  Laterality: Right;   CORONARY STENT INTERVENTION N/A 01/28/2022   Procedure: CORONARY STENT INTERVENTION;  Surgeon: Jettie Booze, MD;  Location: [place] [facility];  Service: Cardiovascular;  Laterality: N/A;   ELBOW SURGERY     ulnar nerve repair   ESOPHAGOGASTRODUODENOSCOPY     same time    FOOT SURGERY     crushed heel   INTRAVASCULAR ULTRASOUND/IVUS N/A 01/28/2022   Procedure: Intravascular Ultrasound/IVUS;  Surgeon: Jettie Booze, MD;  Location: [place] [facility];  Service: Cardiovascular;  Laterality: N/A;   LAPAROTOMY N/A 09/29/2017   Procedure: EXPLORATORY LAPAROTOMY;  Surgeon: Jovita Kussmaul, MD;  Location: [facility];  Service: General;  Laterality: N/A;   LEFT HEART CATH N/A 01/28/2022   Procedure: Left Heart Cath;  Surgeon: Jettie Booze, MD;  Location: [place] [facility];  Service: Cardiovascular;  Laterality: N/A;   LEFT HEART CATH AND CORONARY ANGIOGRAPHY N/A 01/21/2022   Procedure: LEFT HEART CATH AND CORONARY ANGIOGRAPHY;  Surgeon: Jettie Booze, MD;  Location: [place] [facility];  Service: Cardiovascular;  Laterality: N/A;    Current Medications: Current Meds  Medication Sig   acetaminophen (TYLENOL) 500 MG tablet Take 1,000 mg by mouth every 6 (six) hours as needed for moderate pain.   amLODipine (NORVASC) 5 MG tablet Take 5 mg by mouth daily.   aspirin 81 MG EC tablet Take 81 mg by mouth at bedtime.   atorvastatin (LIPITOR) 80 MG tablet Take 80 mg by  mouth at bedtime.    calcium elemental as carbonate (TUMS ULTRA 1000) 400 MG chewable tablet Chew 2,000 mg by mouth daily as needed for heartburn.   Cholecalciferol (VITAMIN D3) 50 MCG (2000 UT) TABS Take 2,000 Units by mouth daily.   clopidogrel (PLAVIX) 75 MG tablet Take 75 mg by mouth daily.   cyclobenzaprine (FLEXERIL) 10 MG tablet Take 10 mg by mouth as needed (muscle pain).    gabapentin (NEURONTIN) 300 MG capsule Take 300 mg by mouth at bedtime.   Glucosamine-Chondroitin (COSAMIN DS PO) Take 1 capsule by mouth at bedtime.   LANTUS SOLOSTAR 100 UNIT/ML Solostar Pen Inject 38 Units into the skin every morning.   losartan (COZAAR) 100 MG tablet Take 100 mg by mouth at bedtime.   nitroGLYCERIN (NITROSTAT) 0.4 MG SL tablet Place 1 tablet (0.4 mg total) under the tongue every 5 (five) minutes as needed for chest pain.  primidone (MYSOLINE) 50 MG tablet Take 50 mg by mouth at bedtime.   propranolol (INDERAL) 20 MG tablet Take 20-40 mg by mouth every morning. Take 40 mg in the morning and 20 mg at lunch time   tiZANidine (ZANAFLEX) 4 MG tablet Take 4 mg by mouth every 6 (six) hours as needed for muscle spasms.   triamcinolone cream (KENALOG) 0.1 % Apply 1 Application topically 2 (two) times daily as needed (psoriasis).   zinc gluconate 50 MG tablet Take 50 mg by mouth in the morning.     Allergies:   Ticagrelor, Codeine, Hydrocodone, Hydromorphone, Penicillins, and Sulfa antibiotics   Social History   Socioeconomic History   Marital status: Married    Spouse name: [name]    Number of children: 1   Years of education: 12+   Highest education level: Not on file  Occupational History   Occupation: Retired  Tobacco Use   Smoking status: Former   Smokeless tobacco: Never   Tobacco comments:    quit over 40 years ago.Around age [age]   Vaping Use   Vaping Use: Never used  Substance and Sexual Activity   Alcohol use: Yes    Alcohol/week: 0.0 standard drinks of alcohol    Comment:  occasional   Drug use: No   Sexual activity: Not on file  Other Topics Concern   Not on file  Social History Narrative   Lives in [place].    Patient lives at home with wife    Patient has 1 child   Retired    Patient has 12+ years of education    Social Determinants of Health   Financial Resource Strain: Not on file  Food Insecurity: No Food Insecurity (03/18/2020)   Hunger Vital Sign    Worried About Running Out of Food in the Last Year: Never true    Ran Out of Food in the Last Year: Never true  Transportation Needs: No Transportation Needs (03/18/2020)   PRAPARE - Hydrologist (Medical): No    Lack of Transportation (Non-Medical): No  Physical Activity: Not on file  Stress: Not on file  Social Connections: Not on file     Family History: The patient's family history includes Heart disease in his mother; Kidney Stones in his mother. There is no history of Colon cancer or Esophageal cancer.  ROS:   Please see the history of present illness.    (+) Chronic fatigue (+) Acid reflux All other systems reviewed and are negative.  EKGs/Labs/Other Studies Reviewed:    The following studies were reviewed today:  Echocardiogram  02/17/2022:  1. Left ventricular ejection fraction, by estimation, is 65 to 70%. Left  ventricular ejection fraction by 3D volume is 68 %. The left ventricle has  normal function. The left ventricle has no regional wall motion  abnormalities. Left ventricular diastolic   parameters are consistent with Grade I diastolic dysfunction (impaired  relaxation).   2. Right ventricular systolic function is normal. The right ventricular  size is normal. There is normal pulmonary artery systolic pressure.   3. Left atrial size was moderately dilated.   4. Right atrial size was mildly dilated.   5. The mitral valve is normal in structure. No evidence of mitral valve  regurgitation. No evidence of mitral stenosis.   6. Tricuspid valve  regurgitation is mild to moderate.   7. The aortic valve is normal in structure. Aortic valve regurgitation is  not visualized. No aortic stenosis is  present.   8. The inferior vena cava is normal in size with greater than 50%  respiratory variability, suggesting right atrial pressure of 3 mmHg.   Comparison(s): No significant change from prior study. Prior images  reviewed side by side.   Coronary Stent Intervention  01/28/2022:   Ost RCA lesion is 70% stenosed.  A drug-eluting stent was successfully placed using a SYNERGY XD 4.0X12, postdilated to 22 Atm and optmized with IVUS. THere were a few struts hanging into the aorta.   Post intervention, there is a 0% residual stenosis.   Mid RCA lesion is 90% stenosed in a very tortuous segment.  A drug-eluting stent was successfully placed using a SYNERGY XD 3.0X20, postdilated to 3.5 mm and optimized with IVUS.   Post intervention, there is a 0% residual stenosis.   LV end diastolic pressure is normal.   Continue DAPT for 6 months.  Contrast use minimized.  Aggressive hydration post procedure.  Diagnostic: Dominance: Right   Intervention:    Left Heart Cath  01/21/2022:   Mid RCA lesion is 90% stenosed.   Ost RCA lesion is 40% stenosed.   Prox LAD lesion is 25% stenosed.   LV end diastolic pressure is normal.   There is no aortic valve stenosis.   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   Will review with primary cardiologist and likely CT surgery regarding aneurysm.     If no plans for surgery, will bring him back to RCA PCI.  Renal insufficiency as well, so beneficial to defer PCI today to minimize contrast exposure.   Diagnostic: Dominance: Right   Nuclear stress test [facility]- abnormal with moderate-sized perfusion defect in the basal to mid inferior inferior septal wall worse at stress consistent with ischemia.  3 times daily ratio was also abnormal at 1.24 suggestive of possible  multivessel coronary artery disease.  Ejection fraction was 66%.   Echo 05/02/2021:  1. Left ventricular ejection fraction, by estimation, is 60 to 65%. The  left ventricle has normal function. The left ventricle has no regional  wall motion abnormalities. Left ventricular diastolic parameters are  consistent with Grade I diastolic dysfunction (impaired relaxation).   2. The tricuspid valve is abnormal. Tricuspid valve regurgitation is  mild.   3. The aortic valve is tricuspid. Aortic valve regurgitation is not  visualized.   4. There is normal pulmonary artery systolic pressure.   5. The inferior vena cava is normal in size with greater than 50%  respiratory variability, suggesting right atrial pressure of 3 mmHg.   Bilateral Carotid Doppler 05/02/2021: Summary:  Right Carotid: Velocities in the right ICA are consistent with a 1-39%  stenosis.   Left Carotid: Velocities in the left ICA are consistent with a 1-39%  stenosis.   Vertebrals:  Bilateral vertebral arteries demonstrate antegrade flow.  Subclavians: Normal flow hemodynamics were seen in bilateral subclavian arteries.    EKG:  EKG is personally reviewed and interpreted. 03/15/2022:  EKG was not ordered. 01/18/2022: Sinus rhythm. Rate 67 bpm. Septal infarct pattern, nonspecific T wave versions noted.  J-point elevation precordial leads.  Recent Labs: 01/27/2022: Hemoglobin 13.3; Platelets 113 02/04/2022: BUN 37; Creatinine, Ser 2.71; Potassium 4.7; Sodium 140   Recent Lipid Panel    Component Value Date/Time   CHOL 97 05/02/2021 0341   TRIG 84 05/02/2021 0341   HDL 25 (L) 05/02/2021 0341   CHOLHDL 3.9 05/02/2021 0341   VLDL 17 05/02/2021 0341   LDLCALC 55  05/02/2021 0341     Risk Assessment/Calculations:          Physical Exam:    VS:  BP 100/64   Pulse 64   Ht '5\' 9"'$  (1.753 m)   Wt 180 lb (81.6 kg)   BMI 26.58 kg/m     Wt Readings from Last 3 Encounters:  10/21/22 180 lb (81.6 kg)  04/29/22 186 lb 8.2  oz (84.6 kg)  04/04/22 187 lb (84.8 kg)     GEN: Well nourished, well developed in no acute distress HEENT: Normal NECK: No JVD; No carotid bruits LYMPHATICS: No lymphadenopathy CARDIAC: RRR, 1/6 systolic murmur, rubs, gallops RESPIRATORY:  Clear to auscultation without rales, wheezing or rhonchi  ABDOMEN: Soft, non-tender, Protuberant, ventral hernias noted MUSCULOSKELETAL:  No edema; No deformity  SKIN: Warm and dry NEUROLOGIC:  Alert and oriented x 3 PSYCHIATRIC:  Normal affect   ASSESSMENT:    1. Coronary artery disease involving native coronary artery of native heart without angina pectoris   2. Gastroesophageal reflux disease, unspecified whether esophagitis present   3. Chronic kidney disease, stage IV (severe) (HCC)      PLAN:    In order of problems listed above:  CAD (coronary artery disease) Right coronary artery stenting x2.  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  Discussed at length.  Has chronic kidney disease.  Overall started cardiac rehab.  He does wish he had more energy.  We went through his medications.  The propranolol has been on for quite some time for essential tremor.  This nonselective beta-blocker can cause fatigue.  Also gabapentin although he is only taking it at night can cause fatigue as well.  Encouraged continued exercise.  I do believe he can perform this safely.  Continue with dual antiplatelet therapy for a total of 6 months.  In the future, need to weigh the risks and benefits of using coronary CT to monitor left main aneurysm given contrast load and chronic kidney disease stage IV.  Multiple colleagues discussion.  Doing very well with exercise.  Recumbent tricycle bike.  They are going to use it on the [place].  Has E assist.  In May 2024 it will be 1 years post stent placement.  I am fine with him coming off the aspirin 81 mg and just staying on Plavix long-term.  He was on Plavix previously because of TIAs in  the past.  HTN (hypertension) He is back on low-dose amlodipine.  Has had some difficulty with lower extremity swelling at times.  Mild constipation as well.  MiraLAX would be helpful.  Stable.  Chronic kidney disease, stage IV (severe) ([place]) Last nephrologist visit creatinine was 2.7.  Understands the risks with contrast in the future.  I would worry about contrast load unless we absolutely need to use it.  GERD (gastroesophageal reflux disease) Omeprazole used to be better for him than current pantoprazole.  Unfortunately he is on Plavix currently.  Could try Pepcid.  I asked him to discuss this with a gastroenterologist or perhaps his primary care physician.  He may have seen a gastroenterologist when he was admitted to [facility] with his nausea.  If symptoms worsen or become worrisome he knows to seek medical attention.    Follow-up:  6 months  Medication Adjustments/Labs and Tests Ordered: Current medicines are reviewed at length with the patient today.  Concerns regarding medicines are outlined above.   No orders of the defined types were placed in this encounter.  No  orders of the defined types were placed in this encounter.  Patient Instructions  Medication Instructions:  The current medical regimen is effective;  continue present plan and medications.  *If you need a refill on your cardiac medications before your next appointment, please call your pharmacy*  Follow-Up: At [facility], you and your health needs are our priority.  As part of our continuing mission to provide you with exceptional heart care, we have created designated Provider Care Teams.  These Care Teams include your primary Cardiologist (physician) and Advanced Practice Providers (APPs -  Physician Assistants and Nurse Practitioners) who all work together to provide you with the care you need, when you need it.  We recommend signing up for the patient portal called "MyChart".  Sign up information is  provided on this After Visit Summary.  MyChart is used to connect with patients for Virtual Visits (Telemedicine).  Patients are able to view lab/test results, encounter notes, upcoming appointments, etc.  Non-urgent messages can be sent to your provider as well.   To learn more about what you can do with MyChart, go to [email or web address].    Your next appointment:   6 month(s)  Provider:   Candee Furbish, MD         Signed, Candee Furbish, MD  10/21/2022 5:35 PM    [place]

## 2022-10-21 NOTE — Patient Instructions (Signed)
Medication Instructions:  The current medical regimen is effective;  continue present plan and medications.  *If you need a refill on your cardiac medications before your next appointment, please call your pharmacy*  Follow-Up: At [facility], you and your health needs are our priority.  As part of our continuing mission to provide you with exceptional heart care, we have created designated Provider Care Teams.  These Care Teams include your primary Cardiologist (physician) and Advanced Practice Providers (APPs -  Physician Assistants and Nurse Practitioners) who all work together to provide you with the care you need, when you need it.  We recommend signing up for the patient portal called "MyChart".  Sign up information is provided on this After Visit Summary.  MyChart is used to connect with patients for Virtual Visits (Telemedicine).  Patients are able to view lab/test results, encounter notes, upcoming appointments, etc.  Non-urgent messages can be sent to your provider as well.   To learn more about what you can do with MyChart, go to [email or web address].    Your next appointment:   6 month(s)  Provider:   Mark Skains, MD     

## 2022-11-15 ENCOUNTER — Encounter: Payer: Self-pay | Admitting: Gastroenterology

## 2022-12-06 ENCOUNTER — Encounter (INDEPENDENT_AMBULATORY_CARE_PROVIDER_SITE_OTHER): Payer: HMO | Admitting: Ophthalmology

## 2023-02-02 ENCOUNTER — Other Ambulatory Visit: Payer: Self-pay | Admitting: Gastroenterology

## 2023-02-02 ENCOUNTER — Other Ambulatory Visit: Payer: Self-pay | Admitting: Physician Assistant

## 2023-02-02 DIAGNOSIS — K862 Cyst of pancreas: Secondary | ICD-10-CM

## 2023-02-08 ENCOUNTER — Other Ambulatory Visit (HOSPITAL_BASED_OUTPATIENT_CLINIC_OR_DEPARTMENT_OTHER): Payer: Self-pay

## 2023-02-08 DIAGNOSIS — G4739 Other sleep apnea: Secondary | ICD-10-CM

## 2023-02-14 ENCOUNTER — Ambulatory Visit: Payer: No Typology Code available for payment source

## 2023-02-14 DIAGNOSIS — K862 Cyst of pancreas: Secondary | ICD-10-CM | POA: Diagnosis not present

## 2023-02-14 MED ORDER — GADOBUTROL 1 MMOL/ML IV SOLN
8.0000 mL | Freq: Once | INTRAVENOUS | Status: AC | PRN
  Administered 2023-02-14: 8 mL via INTRAVENOUS

## 2023-02-22 ENCOUNTER — Telehealth: Payer: Self-pay | Admitting: *Deleted

## 2023-02-22 NOTE — Telephone Encounter (Signed)
   Name: Alex Mccarty  DOB: November 30, 1945  MRN: [id number]  Primary Cardiologist: Donato Schultz, MD   Preoperative team, please contact this patient and set up a phone call appointment for further preoperative risk assessment. Please obtain consent and complete medication review. Thank you for your help.  I confirm that guidance regarding antiplatelet and oral anticoagulation therapy has been completed and, if necessary, noted below.  Per office protocol, he may hold Plavix  for 7 days prior to procedure and should resume as soon as hemodynamically stable postoperatively.   Will continue aspirin 81 mg through perioperative phase.    Carlos Levering, NP 02/22/2023, 4:55 PM [facility]

## 2023-02-22 NOTE — Telephone Encounter (Signed)
   Pre-operative Risk Assessment    Patient Name: Alex Mccarty  DOB: 07-07-1946 MRN: [id number]      Request for Surgical Clearance    Procedure:   MOHS SURGERY RIGHT LOWER LID AND SECONDARY RECONSTRUCTION  Date of Surgery:  Clearance TBD                                 Surgeon:  Galen Manila, MD Surgeon's Group or Practice Name:  [facility] Phone number:  [phone] Fax number:  [phone]   Type of Clearance Requested:   - Pharmacy:  Hold Clopidogrel (Plavix) X'S 7 DAYS  AND STATES IT'S OK TO STAY ON ASPIRIN   Type of Anesthesia:  MAC   Additional requests/questions:    Signed, Elliot Cousin   02/22/2023, 11:22 AM

## 2023-02-23 ENCOUNTER — Telehealth: Payer: Self-pay

## 2023-02-23 ENCOUNTER — Encounter: Payer: Self-pay | Admitting: Cardiology

## 2023-02-23 NOTE — Telephone Encounter (Signed)
Pt was scheduled 06/18 at 940am. Med rec and consent done.  

## 2023-02-23 NOTE — Addendum Note (Signed)
Addended by: Anselm Lis A on: 02/23/2023 11:06 AM   Modules accepted: Orders

## 2023-02-23 NOTE — Telephone Encounter (Signed)
Pt was scheduled 06/18 at 940am. Med rec and consent done.

## 2023-03-03 ENCOUNTER — Ambulatory Visit (HOSPITAL_BASED_OUTPATIENT_CLINIC_OR_DEPARTMENT_OTHER): Payer: No Typology Code available for payment source | Attending: Internal Medicine | Admitting: Internal Medicine

## 2023-03-03 VITALS — Ht 69.0 in | Wt 182.0 lb

## 2023-03-03 DIAGNOSIS — G4739 Other sleep apnea: Secondary | ICD-10-CM | POA: Insufficient documentation

## 2023-03-03 DIAGNOSIS — G4733 Obstructive sleep apnea (adult) (pediatric): Secondary | ICD-10-CM

## 2023-03-07 ENCOUNTER — Ambulatory Visit: Payer: No Typology Code available for payment source | Attending: Cardiovascular Disease | Admitting: Nurse Practitioner

## 2023-03-07 DIAGNOSIS — Z01810 Encounter for preprocedural cardiovascular examination: Secondary | ICD-10-CM | POA: Diagnosis not present

## 2023-03-07 NOTE — Progress Notes (Signed)
Virtual Visit via Telephone Note   Because of Alex Mccarty's co-morbid illnesses, he is at least at moderate risk for complications without adequate follow up.  This format is felt to be most appropriate for this patient at this time.  The patient did not have access to video technology/had technical difficulties with video requiring transitioning to audio format only (telephone).  All issues noted in this document were discussed and addressed.  No physical exam could be performed with this format.  Please refer to the patient's chart for his consent to telehealth for [facility].  Evaluation Performed:  Preoperative cardiovascular risk assessment _____________   Date:  03/07/2023   Patient ID:  Alex Mccarty, DOB 1946/06/13, MRN [id number] Patient Location:  Home Provider location:   Office  Primary Care Provider:  Benjiman Core, MD Primary Cardiologist:  Donato Schultz, MD  Chief Complaint / Patient Profile   77 y.o. y/o male with a h/o CAD s/p DES x 2-RCA in 01/2022, hypertension, hyperlipidemia, TIA, type 2 diabetes, and CKD stage IV who is pending MOHS SURGERY RIGHT LOWER LID AND SECONDARY RECONSTRUCTION with Dr.Tal Westley Foots of [facility] and presents today for telephonic preoperative cardiovascular risk assessment.  History of Present Illness    Alex Mccarty is a 77 y.o. male who presents via audio/video conferencing for a telehealth visit today.  Wife is present by phone as well.  Pt was last seen in cardiology clinic on 10/21/2022 by Dr. Anne Fu.  At that time Alex Mccarty was doing well. The patient is now pending procedure as outlined above. Since his last visit, he has done well from a cardiac standpoint.   He denies chest pain, palpitations, dyspnea, pnd, orthopnea, n, v, dizziness, syncope, edema, weight gain, or early satiety. All other systems reviewed and are otherwise negative except as noted above.   Past Medical History    Past  Medical History:  Diagnosis Date   Arthritis    Bowel obstruction (HCC)    CKD (chronic kidney disease)    unkknown stage per patient   Coronary artery disease    Diabetes mellitus (HCC)    Hyperlipidemia    Hypertension    Kidney stones    Past Surgical History:  Procedure Laterality Date   CARDIAC CATHETERIZATION     COLONOSCOPY  10/2015   Dr Glenice Bow   COLOSTOMY REVISION Right 09/29/2017   Procedure: COLON RESECTION RIGHT;  Surgeon: Griselda Miner, MD;  Location: [facility];  Service: General;  Laterality: Right;   CORONARY STENT INTERVENTION N/A 01/28/2022   Procedure: CORONARY STENT INTERVENTION;  Surgeon: Corky Crafts, MD;  Location: [facility] INVASIVE CV LAB;  Service: Cardiovascular;  Laterality: N/A;   CORONARY ULTRASOUND/IVUS N/A 01/28/2022   Procedure: Intravascular Ultrasound/IVUS;  Surgeon: Corky Crafts, MD;  Location: [facility] INVASIVE CV LAB;  Service: Cardiovascular;  Laterality: N/A;   ELBOW SURGERY     ulnar nerve repair   ESOPHAGOGASTRODUODENOSCOPY     same time    FOOT SURGERY     crushed heel   LAPAROTOMY N/A 09/29/2017   Procedure: EXPLORATORY LAPAROTOMY;  Surgeon: Griselda Miner, MD;  Location: [facility];  Service: General;  Laterality: N/A;   LEFT HEART CATH N/A 01/28/2022   Procedure: Left Heart Cath;  Surgeon: Corky Crafts, MD;  Location: [facility] INVASIVE CV LAB;  Service: Cardiovascular;  Laterality: N/A;   LEFT HEART CATH AND CORONARY ANGIOGRAPHY N/A 01/21/2022   Procedure: LEFT HEART CATH AND CORONARY ANGIOGRAPHY;  Surgeon: Corky Crafts, MD;  Location: [facility] INVASIVE CV LAB;  Service: Cardiovascular;  Laterality: N/A;    Allergies  Allergies  Allergen Reactions   Ticagrelor Shortness Of Breath   Codeine Nausea And Vomiting   Hydrocodone Nausea And Vomiting   Hydromorphone     Confusion (intolerance) Hallucinations   Penicillins Other (See Comments)    Childhood reaction Has patient had a PCN reaction causing immediate rash,  facial/tongue/throat swelling, SOB or lightheadedness with hypotension: Unknown Has patient had a PCN reaction causing severe rash involving mucus membranes or skin necrosis: Unknown Has patient had a PCN reaction that required hospitalization: Unknown Has patient had a PCN reaction occurring within the last 10 years: Unknown If all of the above answers are "NO", then may proceed with Cephalosporin use.     Sulfa Antibiotics Nausea And Vomiting    Home Medications    Prior to Admission medications   Medication Sig Start Date End Date Taking? Authorizing Provider  acetaminophen (TYLENOL) 500 MG tablet Take 1,000 mg by mouth every 6 (six) hours as needed for moderate pain.    [provider]  amLODipine (NORVASC) 5 MG tablet Take 5 mg by mouth daily.    [provider]  aspirin 81 MG EC tablet Take 81 mg by mouth at bedtime. 04/30/19   [provider]  atorvastatin (LIPITOR) 80 MG tablet Take 80 mg by mouth at bedtime.  02/23/14   [provider]  calcium elemental as carbonate (TUMS ULTRA 1000) 400 MG chewable tablet Chew 2,000 mg by mouth daily as needed for heartburn.    [provider]  Cholecalciferol (VITAMIN D3) 50 MCG (2000 UT) TABS Take 2,000 Units by mouth daily.    [provider]  clopidogrel (PLAVIX) 75 MG tablet Take 75 mg by mouth daily. 07/23/21   [provider]  cyclobenzaprine (FLEXERIL) 10 MG tablet Take 10 mg by mouth as needed (muscle pain).     [provider]  empagliflozin (JARDIANCE) 25 MG TABS tablet 12.5 mg. 05/19/22   [provider]  gabapentin (NEURONTIN) 300 MG capsule Take 300 mg by mouth at bedtime.    [provider]  Glucosamine-Chondroitin (COSAMIN DS PO) Take 1 capsule by mouth at bedtime.    [provider]  LANTUS SOLOSTAR 100 UNIT/ML Solostar Pen Inject 38 Units into the skin every morning. 06/02/14   [provider]  losartan (COZAAR) 100 MG tablet  Take 100 mg by mouth at bedtime. 08/08/14   [provider]  nitroGLYCERIN (NITROSTAT) 0.4 MG SL tablet Place 1 tablet (0.4 mg total) under the tongue every 5 (five) minutes as needed for chest pain. 03/15/22 03/15/23  Jake Bathe, MD  pantoprazole (PROTONIX) 40 MG tablet Take 1 tablet (40 mg total) by mouth daily. 03/15/22   Jake Bathe, MD  propranolol (INDERAL) 20 MG tablet Take 20-40 mg by mouth every morning. Take 40 mg in the morning and 20 mg at lunch time    [provider]  tiZANidine (ZANAFLEX) 4 MG tablet Take 4 mg by mouth every 6 (six) hours as needed for muscle spasms.    [provider]  triamcinolone cream (KENALOG) 0.1 % Apply 1 Application topically 2 (two) times daily as needed (psoriasis).    [provider]  zinc gluconate 50 MG tablet Take 50 mg by mouth in the morning.    [provider]    Physical Exam    Vital Signs:  Alex Mccarty does not have vital signs available for review today.  Given telephonic nature of communication, physical exam is limited. AAOx3. NAD. Normal affect.  Speech and respirations are unlabored.  Accessory Clinical Findings    None  Assessment & Plan    1.  Preoperative Cardiovascular Risk Assessment:  According to the Revised Cardiac Risk Index (RCRI), his Perioperative Risk of Major Cardiac Event is (%): 11. His Functional Capacity in METs is: 8.91 according to the Duke Activity Status Index (DASI).Therefore, based on ACC/AHA guidelines, patient would be at acceptable risk for the planned procedure without further cardiovascular testing.   The patient was advised that if he develops new symptoms prior to surgery to contact our office to arrange for a follow-up visit, and he verbalized understanding.  Per office protocol, he may hold Plavix for 5-7 days prior to procedure. Please resume Plavix as soon as possible postprocedure, at the discretion of the surgeon.  Patient continue aspirin  throughout the perioperative.   A copy of this note will be routed to requesting surgeon.  Time:   Today, I have spent 10 minutes with the patient with telehealth technology discussing medical history, symptoms, and management plan.     Joylene Grapes, NP  03/07/2023, 9:56 AM

## 2023-03-11 DIAGNOSIS — G4739 Other sleep apnea: Secondary | ICD-10-CM

## 2023-03-11 NOTE — Procedures (Signed)
   Patient Name: Alex Mccarty, Alex Mccarty Date: 03/03/2023 Gender: Male D.O.B: 1945/11/26 Age (years): 71 Referring Provider: Smith Robert MD Height (inches): 69 Interpreting Physician: Jetty Duhamel MD, ABSM Weight (lbs): 182 RPSGT: Lowry Ram BMI: 27 MRN: [id number] Neck Size: 16.00  CLINICAL INFORMATION Sleep Study Type: NPSG Indication for sleep study: Diabetes, Hypertension, Morning Headaches, Obesity, Re-Evaluation, Snoring Epworth Sleepiness Score: 13  Most recent polysomnogram was on 08/08/2020.  SLEEP STUDY TECHNIQUE As per the AASM Manual for the Scoring of Sleep and Associated Events v2.3 (April 2016) with a hypopnea requiring 4% desaturations.  The channels recorded and monitored were frontal, central and occipital EEG, electrooculogram (EOG), submentalis EMG (chin), nasal and oral airflow, thoracic and abdominal wall motion, anterior tibialis EMG, snore microphone, electrocardiogram, and pulse oximetry.  MEDICATIONS Medications self-administered by patient taken the night of the study : Atorvastatin, Clopidogrel Bisulfate, CosaminDS, GABAPENTIN, LOSARTAN, Vitamin D3  SLEEP ARCHITECTURE The study was initiated at 10:09:04 PM and ended at 4:34:47 AM.  Sleep onset time was 6.2 minutes and the sleep efficiency was 82.6%. The total sleep time was 318.5 minutes.  Stage REM latency was 58.0 minutes.  The patient spent 3.3% of the night in stage N1 sleep, 79.4% in stage N2 sleep, 0.0% in stage N3 and 17.3% in REM.  Alpha intrusion was absent.  Supine sleep was 18.05%.  RESPIRATORY PARAMETERS The overall apnea/hypopnea index (AHI) was 5.1 per hour. There were 4 total apneas, including 4 obstructive, 0 central and 0 mixed apneas. There were 23 hypopneas and 25 RERAs.  The AHI during Stage REM sleep was 4.4 per hour.  AHI while supine was 23.0 per hour.  The mean oxygen saturation was 91.5%. The minimum SpO2 during sleep was 81.0%.  moderate snoring  was noted during this study.  CARDIAC DATA The 2 lead EKG demonstrated sinus rhythm. The mean heart rate was 64.6 beats per minute. Other EKG findings include: PVCs.  LEG MOVEMENT DATA The total PLMS were 0 with a resulting PLMS index of 0.0. Associated arousal with leg movement index was 2.8 .  IMPRESSIONS - Minimal obstructive sleep apnea occurred during this study (AHI = 5.1/h). - Mild oxygen desaturation was noted during this study (Min O2 = 81.0%, Mean 91.5%). Time with O2 saturation 88% or less was 5.8 minutes. - The patient snored with moderate snoring volume. - EKG findings include PVCs. - Clinically significant periodic limb movements did not occur during sleep. No significant associated arousals.  DIAGNOSIS - Obstructive Sleep Apnea (G47.33)  RECOMMENDATIONS - This study shows respiratory events barely above the upper limit of normal. Decision to treat would consider symptoms and co-morbidity. Conservative measures may include observation, weight loss and sleep position off back. Other options would be based on clinical judgment. - Be careful with alcohol, sedatives and other CNS depressants that may worsen sleep apnea and disrupt normal sleep architecture. - Sleep hygiene should be reviewed to assess factors that may improve sleep quality. - Weight management and regular exercise should be initiated or continued if appropriate.  [Electronically signed] 03/11/2023 11:32 AM  Jetty Duhamel MD, ABSM Diplomate, American Board of Sleep Medicine NPI: [id number]              Jetty Duhamel Diplomate, American Board of Sleep Medicine  ELECTRONICALLY SIGNED ON:  03/11/2023, 11:23 AM [facility] PH: [phone]   FX: [phone] ACCREDITED BY THE AMERICAN ACADEMY OF SLEEP MEDICINE

## 2023-03-22 NOTE — Telephone Encounter (Signed)
error 

## 2023-03-23 ENCOUNTER — Emergency Department (HOSPITAL_BASED_OUTPATIENT_CLINIC_OR_DEPARTMENT_OTHER)
Admission: EM | Admit: 2023-03-23 | Discharge: 2023-03-23 | Disposition: A | Discharge status: Home / Self Care | Payer: No Typology Code available for payment source | Attending: Emergency Medicine | Admitting: Emergency Medicine

## 2023-03-23 ENCOUNTER — Other Ambulatory Visit: Payer: Self-pay

## 2023-03-23 DIAGNOSIS — I2510 Atherosclerotic heart disease of native coronary artery without angina pectoris: Secondary | ICD-10-CM | POA: Insufficient documentation

## 2023-03-23 DIAGNOSIS — Z23 Encounter for immunization: Secondary | ICD-10-CM | POA: Diagnosis not present

## 2023-03-23 DIAGNOSIS — E1122 Type 2 diabetes mellitus with diabetic chronic kidney disease: Secondary | ICD-10-CM | POA: Diagnosis not present

## 2023-03-23 DIAGNOSIS — Z79899 Other long term (current) drug therapy: Secondary | ICD-10-CM | POA: Insufficient documentation

## 2023-03-23 DIAGNOSIS — Z794 Long term (current) use of insulin: Secondary | ICD-10-CM | POA: Diagnosis not present

## 2023-03-23 DIAGNOSIS — N189 Chronic kidney disease, unspecified: Secondary | ICD-10-CM | POA: Insufficient documentation

## 2023-03-23 DIAGNOSIS — I129 Hypertensive chronic kidney disease with stage 1 through stage 4 chronic kidney disease, or unspecified chronic kidney disease: Secondary | ICD-10-CM | POA: Diagnosis not present

## 2023-03-23 DIAGNOSIS — S61412A Laceration without foreign body of left hand, initial encounter: Secondary | ICD-10-CM | POA: Diagnosis present

## 2023-03-23 DIAGNOSIS — Z7902 Long term (current) use of antithrombotics/antiplatelets: Secondary | ICD-10-CM | POA: Insufficient documentation

## 2023-03-23 DIAGNOSIS — W260XXA Contact with knife, initial encounter: Secondary | ICD-10-CM | POA: Diagnosis not present

## 2023-03-23 DIAGNOSIS — Z7982 Long term (current) use of aspirin: Secondary | ICD-10-CM | POA: Insufficient documentation

## 2023-03-23 MED ORDER — LIDOCAINE HCL (PF) 1 % IJ SOLN
10.0000 mL | Freq: Once | INTRAMUSCULAR | Status: AC
  Administered 2023-03-23: 10 mL
  Filled 2023-03-23: qty 10

## 2023-03-23 MED ORDER — TETANUS-DIPHTH-ACELL PERTUSSIS 5-2.5-18.5 LF-MCG/0.5 IM SUSY
0.5000 mL | PREFILLED_SYRINGE | Freq: Once | INTRAMUSCULAR | Status: AC
  Administered 2023-03-23: 0.5 mL via INTRAMUSCULAR
  Filled 2023-03-23: qty 0.5

## 2023-03-23 NOTE — ED Triage Notes (Signed)
Pt reports cutting left index finger with a pocket knife. 1.5 inch laceration to left index finger. Pt currently taking Plavix and Asprin daily.

## 2023-03-23 NOTE — ED Notes (Signed)
ED Provider at bedside. 

## 2023-03-23 NOTE — ED Provider Notes (Signed)
[place] [facility] Provider Note   CSN: [id number] Arrival date & time: 03/23/23  4098     History  Chief Complaint  Patient presents with   Laceration    Alex Mccarty is a 77 y.o. male.  Patient with history of hypertension, hyperlipidemia, diabetes, CKD, CAD presents today with complaints of right hand laceration.  He states that same occurred immediately prior to arrival today when he cut his hand with a pocket knife.  He is on aspirin and Plavix and therefore did have some difficulty getting the bleeding under control.  He also notes some difficulty ranging his finger where the wound is present.  He is unsure of his last tetanus.  Denies any other injuries or complaints.  The history is provided by the patient. No language interpreter was used.  Laceration      Home Medications Prior to Admission medications   Medication Sig Start Date End Date Taking? Authorizing Provider  acetaminophen (TYLENOL) 500 MG tablet Take 1,000 mg by mouth every 6 (six) hours as needed for moderate pain.    [provider]  amLODipine (NORVASC) 5 MG tablet Take 5 mg by mouth daily.    [provider]  aspirin 81 MG EC tablet Take 81 mg by mouth at bedtime. 04/30/19   [provider]  atorvastatin (LIPITOR) 80 MG tablet Take 80 mg by mouth at bedtime.  02/23/14   [provider]  calcium elemental as carbonate (TUMS ULTRA 1000) 400 MG chewable tablet Chew 2,000 mg by mouth daily as needed for heartburn.    [provider]  Cholecalciferol (VITAMIN D3) 50 MCG (2000 UT) TABS Take 2,000 Units by mouth daily.    [provider]  clopidogrel (PLAVIX) 75 MG tablet Take 75 mg by mouth daily. 07/23/21   [provider]  cyclobenzaprine (FLEXERIL) 10 MG tablet Take 10 mg by mouth as needed (muscle pain).     [provider]  empagliflozin (JARDIANCE) 25 MG TABS tablet 12.5 mg. 05/19/22   [provider]  gabapentin (NEURONTIN) 300 MG capsule Take 300 mg by mouth at bedtime.    [provider]  Glucosamine-Chondroitin (COSAMIN DS PO) Take 1 capsule by mouth at bedtime.    [provider]  LANTUS SOLOSTAR 100 UNIT/ML Solostar Pen Inject 38 Units into the skin every morning. 06/02/14   [provider]  losartan (COZAAR) 100 MG tablet Take 100 mg by mouth at bedtime. 08/08/14   [provider]  nitroGLYCERIN (NITROSTAT) 0.4 MG SL tablet Place 1 tablet (0.4 mg total) under the tongue every 5 (five) minutes as needed for chest pain. 03/15/22 03/15/23  Jake Bathe, MD  pantoprazole (PROTONIX) 40 MG tablet Take 1 tablet (40 mg total) by mouth daily. 03/15/22   Jake Bathe, MD  propranolol (INDERAL) 20 MG tablet Take 20-40 mg by mouth every morning. Take 40 mg in the morning and 20 mg at lunch time    [provider]  tiZANidine (ZANAFLEX) 4 MG tablet Take 4 mg by mouth every 6 (six) hours as needed for muscle spasms.    [provider]  triamcinolone cream (KENALOG) 0.1 % Apply 1 Application topically 2 (two) times daily as needed (psoriasis).    [provider]  zinc gluconate 50 MG tablet Take 50 mg by mouth in the morning.    [provider]      Allergies    Ticagrelor, Codeine, Hydrocodone,  Hydromorphone, Penicillins, and Sulfa antibiotics    Review of Systems   Review of Systems  Skin:  Positive for wound.  All other systems reviewed and are negative.   Physical Exam Updated Vital Signs BP (!) 145/87   Pulse 65   Temp 98.1 F (36.7 C) (Oral)   Resp 18   SpO2 99%  Physical Exam Vitals and nursing note reviewed.  Constitutional:      General: He is not in acute distress.    Appearance: Normal appearance. He is normal weight. He is not ill-appearing, toxic-appearing or diaphoretic.  HENT:     Head: Normocephalic and atraumatic.  Cardiovascular:     Rate and Rhythm: Normal rate.  Pulmonary:      Effort: Pulmonary effort is normal. No respiratory distress.  Musculoskeletal:        General: Normal range of motion.     Cervical back: Normal range of motion.  Skin:    General: Skin is warm and dry.     Comments: 3 cm horizontal laceration overlying the MCP of the left first finger.  No active bleeding.  ROM somewhat limited with both flexion and extension.  Good capillary refill and distal sensation.  Neurological:     General: No focal deficit present.     Mental Status: He is alert.  Psychiatric:        Mood and Affect: Mood normal.        Behavior: Behavior normal.     ED Results / Procedures / Treatments   Labs (all labs ordered are listed, but only abnormal results are displayed) Labs Reviewed - No data to display  EKG None  Radiology No results found.  Procedures .Marland KitchenLaceration Repair  Date/Time: 03/23/2023 10:23 AM  Performed by: Silva Bandy, PA-C Authorized by: Silva Bandy, PA-C   Consent:    Consent obtained:  Verbal   Consent given by:  Patient   Risks, benefits, and alternatives were discussed: yes     Risks discussed:  Infection, pain, retained foreign body, tendon damage, vascular damage, poor wound healing, poor cosmetic result, need for additional repair and nerve damage   Alternatives discussed:  No treatment, delayed treatment, observation and referral Universal protocol:    Procedure explained and questions answered to patient or proxy's satisfaction: yes     Imaging studies available: yes     Patient identity confirmed:  Verbally with patient Anesthesia:    Anesthesia method:  Local infiltration   Local anesthetic:  Lidocaine 1% w/o epi Laceration details:    Location:  Finger   Finger location:  L index finger   Length (cm):  3   Depth (mm):  2 Exploration:    Hemostasis achieved with:  Direct pressure   Imaging outcome: foreign body not noted     Wound exploration: wound explored through full range of motion and entire depth of wound  visualized   Treatment:    Area cleansed with:  Soap and water   Amount of cleaning:  Standard   Irrigation solution:  Sterile saline   Irrigation volume:  1 L   Irrigation method:  Pressure wash Skin repair:    Repair method:  Sutures   Suture size:  4-0   Suture material:  Prolene   Suture technique:  Simple interrupted   Number of sutures:  6 Repair type:    Repair type:  Simple Post-procedure details:    Dressing:  Splint for protection, non-adherent dressing and antibiotic ointment  Procedure completion:  Tolerated well, no immediate complications     Medications Ordered in ED Medications - No data to display  ED Course/ Medical Decision Making/ A&P                             Medical Decision Making Risk Prescription drug management.   Patient presents today with complaints of laceration to the MCP of the left first finger immediately prior to arrival today.  He is afebrile, nontoxic-appearing, and in no acute distress with reassuring vital signs.  Physical exam does reveal approximately 3 cm horizontal laceration overlying the MCP.  Good capillary refill and distal sensation.  ROM is somewhat limited with both flexion and extension, potentially pain limiting as he is able to range a few degrees. Pressure irrigation performed of the wound. Wound explored and base of wound visualized in a bloodless field without evidence of foreign body.  Laceration occurred < 8 hours prior to repair which was well tolerated. Tdap updated.  Pt discharged without antibiotics. Will give hand referral for follow-up as patient may have an underlying tendon injury given his limitations with ROM.  I have placed him in a splint for support to wear in the interim.  Discussed suture home care with patient and answered questions. Pt to follow-up for wound check and suture removal in 7 days; they are to return to the ED sooner for signs of infection. Pt is hemodynamically stable with no complaints prior  to dc. Evaluation and diagnostic testing in the emergency department does not suggest an emergent condition requiring admission or immediate intervention beyond what has been performed at this time.  Plan for discharge with close PCP follow-up.  Patient is understanding and amenable with plan, educated on red flag symptoms that would prompt immediate return.  Patient discharged in stable condition.  Final Clinical Impression(s) / ED Diagnoses Final diagnoses:  Laceration of left hand without foreign body, initial encounter    Rx / DC Orders ED Discharge Orders     None     An After Visit Summary was printed and given to the patient.     Vear Clock 03/23/23 1030    Alvira Monday, MD 03/24/23 [phone]

## 2023-03-23 NOTE — Discharge Instructions (Addendum)
You were seen in the emergency department for your left hand laceration.   We have closed your laceration(s) with sutures. These need to be removed in 7 days. This can be done at any doctor's office, urgent care, or emergency department.   If any of the sutures come out before it is time for removal, that is okay. Make sure to keep the area as clean and dry as possible. You can let warm soapy warm run over the area, but do NOT scrub it.   Watch out for signs of infection, like we discussed, including: increased redness, tenderness, or drainage of pus from the area. If this happens and you have not been prescribed an antibiotic, please seek medical attention for possible infection.   Additionally, I have given you a referral to a hand specialist with a number to call to schedule an appointment for follow-up as you could have an underlying tendon injury given your limitations in range of motion.  I have placed you in a immobilizing splint to wear until you can follow-up with them.  Please wear this at all times except while showering.  You can take over the counter pain medicine like ibuprofen or tylenol as needed.   Return if development of any new or worsening symptoms.

## 2023-04-18 ENCOUNTER — Encounter: Payer: Self-pay | Admitting: Cardiology

## 2023-04-26 ENCOUNTER — Other Ambulatory Visit: Payer: Self-pay

## 2023-05-02 ENCOUNTER — Ambulatory Visit: Payer: No Typology Code available for payment source | Admitting: Cardiology

## 2023-05-03 ENCOUNTER — Encounter (HOSPITAL_BASED_OUTPATIENT_CLINIC_OR_DEPARTMENT_OTHER): Payer: Self-pay | Admitting: Cardiology

## 2023-05-03 ENCOUNTER — Ambulatory Visit (HOSPITAL_BASED_OUTPATIENT_CLINIC_OR_DEPARTMENT_OTHER): Payer: No Typology Code available for payment source | Admitting: Cardiology

## 2023-05-03 VITALS — BP 136/76 | HR 60 | Ht 69.0 in | Wt 182.0 lb

## 2023-05-03 DIAGNOSIS — E782 Mixed hyperlipidemia: Secondary | ICD-10-CM | POA: Diagnosis not present

## 2023-05-03 DIAGNOSIS — Z95820 Peripheral vascular angioplasty status with implants and grafts: Secondary | ICD-10-CM

## 2023-05-03 DIAGNOSIS — N184 Chronic kidney disease, stage 4 (severe): Secondary | ICD-10-CM | POA: Diagnosis not present

## 2023-05-03 DIAGNOSIS — I2510 Atherosclerotic heart disease of native coronary artery without angina pectoris: Secondary | ICD-10-CM | POA: Diagnosis not present

## 2023-05-03 NOTE — Progress Notes (Signed)
Cardiology Office Note:  .   Date:  05/03/2023  ID:  Alex Mccarty, DOB May 22, 1946, MRN [id number] PCP: Gillian Scarce  [facility] Providers Cardiologist:  Donato Schultz, MD    History of Present Illness: .     Discussed the use of AI scribe software for clinical note transcription with the patient, who gave verbal consent to proceed.  History of Present Illness   The patient, a 77 year old individual with a history of coronary artery disease, chronic kidney disease (stage 4), and gastroesophageal reflux disease (GERD), presents for a follow-up visit. The patient underwent a staged cardiac catheterization procedure with two stents placed in the right coronary artery in May of the previous year. 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  The patient's kidney function has been stable with creatinine levels around 2.7. He is under the care of a nephrologist for this condition. For GERD, the patient is on omeprazole, and he was considering a switch to Pepcid due to concurrent use of Plavix.  The patient's carotid dopplers from August of the previous year showed mild disease bilaterally. An echocardiogram from the same year showed an ejection fraction of 65-70% with grade one diastolic dysfunction and mild to moderate tricuspid valve regurgitation.  The patient is also being managed by the [facility] for hyperlipidemia and is on atorvastatin 80 mg daily. He is also on aspirin 81 mg and Plavix 75 mg for coronary artery disease. For hypertension, he is on propranolol 20-40 mg in the morning and at lunchtime, losartan 100 mg, and amlodipine 5 mg.  The patient is a former smoker, having quit over 40 years ago. He had an emergency bowel resection in the past and report chronic fatigue.  During the visit, the patient reported occasional shortness of breath, which he attributed to a lack  of exercise. He also reported ongoing heartburn, which he believed was related to his diet.      His new neurologist questioned if he needed to be on both aspirin and Plavix.  I agree that we can stop the aspirin and continue Plavix lifelong.  He is on propranolol for essential tremor.  Studies Reviewed: [name]   EKG Interpretation Date/Time:  Wednesday May 03 2023 09:19:41 EDT Ventricular Rate:  60 PR Interval:  184 QRS Duration:  82 QT Interval:  394 QTC Calculation: 394 R Axis:   38  Text Interpretation: Normal sinus rhythm Normal ECG When compared with ECG of 28-Jan-2022 14:45, Criteria for Septal infarct are no longer Present T wave inversion no longer evident in Inferior leads Nonspecific T wave abnormality no longer evident in Lateral leads Confirmed by Donato Schultz ([id number]) on 05/03/2023 9:26:32 AM    LABS Creatinine: 2.7 LDL: 55 ALT: 35  RADIOLOGY Bilateral carotid dopplers: Mild disease bilaterally (05/02/2021)  DIAGNOSTIC Cardiac catheterization: Stent placement times two to the right coronary artery (01/28/2022) Echocardiogram: EF 65-70%, grade one diastolic dysfunction, mild to moderate tricuspid valve regurgitation (2023) Risk Assessment/Calculations:            Physical Exam:   VS:  BP 136/76   Pulse 60   Ht 5\' 9"  (1.753 m)   Wt 182 lb (82.6 kg)   BMI 26.88 kg/m    Wt Readings from Last 3 Encounters:  05/03/23 182 lb (82.6 kg)  03/03/23 182 lb (82.6 kg)  10/21/22 180 lb (81.6 kg)    GEN: Well  nourished, well developed in no acute distress NECK: No JVD; No carotid bruits CARDIAC: RRR, no murmurs, rubs, gallops RESPIRATORY:  Clear to auscultation without rales, wheezing or rhonchi  ABDOMEN: Soft, non-tender, non-distended EXTREMITIES:  No edema; No deformity   ASSESSMENT AND PLAN: .   Assessment and Plan    Coronary Artery Disease Status post cardiac catheterization with two stents placed in the right coronary artery on 01/28/2022. Noted to have a large  distal left main aneurysm with multiple vessels originating from the aneurysm, managed medically. No current chest pain. Echocardiogram in 2023 showed EF of 65-70% with grade one diastolic dysfunction, mild to moderate tricuspid valve regurgitation. -Discontinue Aspirin 81mg  and continue Plavix 75mg  daily.  Chronic Kidney Disease (Stage 4) Creatinine in the 2.7 range, managed by nephrology. -No changes to current management plan.  Gastroesophageal Reflux Disease (GERD) Currently taking omeprazole. -Continue current management plan.  Hyperlipidemia Currently taking atorvastatin 80mg  daily. Prior LDL 55, ALT 35, no myalgias. -Continue atorvastatin 80mg  daily.  Hypertension Currently taking propranolol 20 to 40mg  in the morning and at lunchtime, losartan 100mg , and amlodipine 5mg . -Continue current management plan.  Follow-up in 1 year unless changes occur.              Signed, Donato Schultz, MD

## 2023-05-03 NOTE — Patient Instructions (Signed)
Medication Instructions:  Your physician has recommended you make the following change in your medication:  1) STOP taking Aspirin *If you need a refill on your cardiac medications before your next appointment, please call your pharmacy*  Follow-Up: At [facility], you and your health needs are our priority.  As part of our continuing mission to provide you with exceptional heart care, we have created designated Provider Care Teams.  These Care Teams include your primary Cardiologist (physician) and Advanced Practice Providers (APPs -  Physician Assistants and Nurse Practitioners) who all work together to provide you with the care you need, when you need it.  Your next appointment:   1 year   Provider:   Donato Schultz, MD

## 2023-05-23 ENCOUNTER — Encounter: Payer: Self-pay | Admitting: Gastroenterology

## 2023-06-02 ENCOUNTER — Other Ambulatory Visit: Payer: Self-pay | Admitting: Cardiology

## 2023-06-02 MED ORDER — NITROGLYCERIN 0.4 MG SL SUBL: 0.4000 mg | SUBLINGUAL_TABLET | SUBLINGUAL | 3 refills | Status: DC | PRN

## 2023-06-05 ENCOUNTER — Other Ambulatory Visit (HOSPITAL_COMMUNITY): Payer: Self-pay

## 2023-06-05 MED ORDER — LOSARTAN POTASSIUM 50 MG PO TABS
75.0000 mg | ORAL_TABLET | Freq: Every day | ORAL | 0 refills | Status: AC
  Filled 2023-06-05: qty 135, 135d supply, fill #0
  Filled 2023-06-06 – 2023-06-20 (×2): qty 135, 90d supply, fill #0

## 2023-06-05 MED ORDER — INSULIN GLARGINE 100 UNIT/ML SOLOSTAR PEN
40.0000 [IU] | PEN_INJECTOR | Freq: Every day | SUBCUTANEOUS | 0 refills | Status: DC
  Filled 2023-06-05: qty 36, 90d supply, fill #0

## 2023-06-06 ENCOUNTER — Other Ambulatory Visit: Payer: Self-pay

## 2023-06-06 ENCOUNTER — Other Ambulatory Visit (HOSPITAL_COMMUNITY): Payer: Self-pay

## 2023-06-06 MED ORDER — LANTUS SOLOSTAR 100 UNIT/ML ~~LOC~~ SOPN
45.0000 [IU] | PEN_INJECTOR | Freq: Every day | SUBCUTANEOUS | 0 refills | Status: DC
  Filled 2023-06-06: qty 45, 100d supply, fill #0

## 2023-06-07 ENCOUNTER — Other Ambulatory Visit: Payer: Self-pay

## 2023-06-20 ENCOUNTER — Other Ambulatory Visit: Payer: Self-pay

## 2023-06-20 ENCOUNTER — Other Ambulatory Visit (HOSPITAL_COMMUNITY): Payer: Self-pay

## 2023-07-07 ENCOUNTER — Other Ambulatory Visit (HOSPITAL_COMMUNITY): Payer: Self-pay

## 2023-08-22 ENCOUNTER — Ambulatory Visit: Payer: No Typology Code available for payment source | Admitting: Cardiology

## 2023-09-09 ENCOUNTER — Other Ambulatory Visit (HOSPITAL_COMMUNITY): Payer: Self-pay

## 2023-09-11 ENCOUNTER — Other Ambulatory Visit (HOSPITAL_BASED_OUTPATIENT_CLINIC_OR_DEPARTMENT_OTHER): Payer: Self-pay

## 2023-09-11 ENCOUNTER — Other Ambulatory Visit (HOSPITAL_COMMUNITY): Payer: Self-pay

## 2023-09-11 MED ORDER — LANTUS SOLOSTAR 100 UNIT/ML ~~LOC~~ SOPN
45.0000 [IU] | PEN_INJECTOR | Freq: Every day | SUBCUTANEOUS | 0 refills | Status: AC
  Filled 2023-09-11 (×2): qty 45, 100d supply, fill #0

## 2023-09-14 ENCOUNTER — Other Ambulatory Visit (HOSPITAL_COMMUNITY): Payer: Self-pay

## 2023-11-23 ENCOUNTER — Telehealth: Payer: Self-pay | Admitting: Cardiology

## 2023-11-23 ENCOUNTER — Other Ambulatory Visit (HOSPITAL_BASED_OUTPATIENT_CLINIC_OR_DEPARTMENT_OTHER): Payer: Self-pay

## 2023-11-23 MED ORDER — NITROGLYCERIN 0.4 MG SL SUBL
0.4000 mg | SUBLINGUAL_TABLET | SUBLINGUAL | 5 refills | Status: DC | PRN
  Filled 2023-11-23: qty 25, 30d supply, fill #0
  Filled 2024-05-16: qty 25, 30d supply, fill #1

## 2023-11-23 NOTE — Telephone Encounter (Signed)
 Pt's medication was sent to pt's pharmacy as requested. Confirmation received.

## 2023-11-23 NOTE — Telephone Encounter (Signed)
*  STAT* If patient is at the pharmacy, call can be transferred to refill team.   1. Which medications need to be refilled? (please list name of each medication and dose if known)  nitroGLYCERIN (NITROSTAT) 0.4 MG SL tablet   2. Which pharmacy/location (including street and city if local pharmacy) is medication to be sent to? [facility]   3. Do they need a 30 day or 90 day supply? 30

## 2023-11-24 ENCOUNTER — Other Ambulatory Visit (HOSPITAL_BASED_OUTPATIENT_CLINIC_OR_DEPARTMENT_OTHER): Payer: Self-pay

## 2023-12-04 ENCOUNTER — Other Ambulatory Visit (HOSPITAL_BASED_OUTPATIENT_CLINIC_OR_DEPARTMENT_OTHER): Payer: Self-pay

## 2023-12-04 MED ORDER — AMOXICILLIN 500 MG PO CAPS
2000.0000 mg | ORAL_CAPSULE | ORAL | 2 refills | Status: AC
  Filled 2023-12-04 – 2024-01-15 (×2): qty 8, 2d supply, fill #0
  Filled 2024-03-04: qty 8, 2d supply, fill #1
  Filled 2024-05-16: qty 8, 2d supply, fill #2

## 2024-01-11 ENCOUNTER — Encounter (HOSPITAL_BASED_OUTPATIENT_CLINIC_OR_DEPARTMENT_OTHER): Payer: Self-pay | Admitting: Cardiology

## 2024-01-11 ENCOUNTER — Other Ambulatory Visit (HOSPITAL_COMMUNITY): Payer: Self-pay

## 2024-01-12 ENCOUNTER — Telehealth: Payer: Self-pay

## 2024-01-12 NOTE — Telephone Encounter (Signed)
   Pre-operative Risk Assessment    Patient Name: Alex Mccarty  DOB: 1945-12-19 MRN: [id number]   Date of last office visit: 05/03/23 Date of next office visit: Not scheduled   Request for Surgical Clearance    Procedure:   EUS  Date of Surgery:  Clearance 02/01/24                                Surgeon:  Dr. Laneta Pintos. [place] Surgeon's Group or Practice Name:  Department of [facility] Phone number:  [phone] Fax number:  [phone]   Type of Clearance Requested:   - Medical    Type of Anesthesia:  Not Indicated   Additional requests/questions:    Gardiner Jumper   01/12/2024, 5:12 PM

## 2024-01-14 ENCOUNTER — Other Ambulatory Visit (HOSPITAL_COMMUNITY): Payer: Self-pay

## 2024-01-15 ENCOUNTER — Telehealth: Payer: Self-pay

## 2024-01-15 ENCOUNTER — Other Ambulatory Visit: Payer: Self-pay

## 2024-01-15 ENCOUNTER — Other Ambulatory Visit (HOSPITAL_COMMUNITY): Payer: Self-pay

## 2024-01-15 NOTE — Telephone Encounter (Signed)
  Patient Consent for Virtual Visit        Alex Mccarty has provided verbal consent on 01/15/2024 for a virtual visit (video or telephone).   CONSENT FOR VIRTUAL VISIT FOR:  Alex Mccarty  By participating in this virtual visit I agree to the following:  I hereby voluntarily request, consent and authorize [facility] and its employed or contracted physicians, physician assistants, nurse practitioners or other licensed health care professionals (the Practitioner), to provide me with telemedicine health care services (the "Services") as deemed necessary by the treating Practitioner. I acknowledge and consent to receive the Services by the Practitioner via telemedicine. I understand that the telemedicine visit will involve communicating with the Practitioner through live audiovisual communication technology and the disclosure of certain medical information by electronic transmission. I acknowledge that I have been given the opportunity to request an in-person assessment or other available alternative prior to the telemedicine visit and am voluntarily participating in the telemedicine visit.  I understand that I have the right to withhold or withdraw my consent to the use of telemedicine in the course of my care at any time, without affecting my right to future care or treatment, and that the Practitioner or I may terminate the telemedicine visit at any time. I understand that I have the right to inspect all information obtained and/or recorded in the course of the telemedicine visit and may receive copies of available information for a reasonable fee.  I understand that some of the potential risks of receiving the Services via telemedicine include:  Delay or interruption in medical evaluation due to technological equipment failure or disruption; Information transmitted may not be sufficient (e.g. poor resolution of images) to allow for appropriate medical decision making by the  Practitioner; and/or  In rare instances, security protocols could fail, causing a breach of personal health information.  Furthermore, I acknowledge that it is my responsibility to provide information about my medical history, conditions and care that is complete and accurate to the best of my ability. I acknowledge that Practitioner's advice, recommendations, and/or decision may be based on factors not within their control, such as incomplete or inaccurate data provided by me or distortions of diagnostic images or specimens that may result from electronic transmissions. I understand that the practice of medicine is not an exact science and that Practitioner makes no warranties or guarantees regarding treatment outcomes. I acknowledge that a copy of this consent can be made available to me via my patient portal [facility] MyChart), or I can request a printed copy by calling the office of [facility].    I understand that my insurance will be billed for this visit.   I have read or had this consent read to me. I understand the contents of this consent, which adequately explains the benefits and risks of the Services being provided via telemedicine.  I have been provided ample opportunity to ask questions regarding this consent and the Services and have had my questions answered to my satisfaction. I give my informed consent for the services to be provided through the use of telemedicine in my medical care

## 2024-01-15 NOTE — Telephone Encounter (Signed)
   Name: Alex Mccarty  DOB: [birth year]  MRN: [id number]  Primary Cardiologist: Dorothye Gathers, MD   Preoperative team, please contact this patient and set up a phone call appointment for further preoperative risk assessment. Please obtain consent and complete medication review. Thank you for your help.  I confirm that guidance regarding antiplatelet and oral anticoagulation therapy has been completed and, if necessary, noted below.  None requested.   I also confirmed the patient resides in the state of [place] . As per [facility] Medical Board telemedicine laws, the patient must reside in the state in which the provider is licensed.   Carie Charity, NP 01/15/2024, 8:18 AM [facility]

## 2024-01-15 NOTE — Telephone Encounter (Signed)
 Patient already have a scheduled tele visit on 5/5 for pre op clearance.

## 2024-01-19 ENCOUNTER — Other Ambulatory Visit (HOSPITAL_COMMUNITY): Payer: Self-pay

## 2024-01-19 ENCOUNTER — Other Ambulatory Visit: Payer: Self-pay

## 2024-01-19 MED ORDER — LANTUS SOLOSTAR 100 UNIT/ML ~~LOC~~ SOPN
38.0000 [IU] | PEN_INJECTOR | Freq: Every morning | SUBCUTANEOUS | 0 refills | Status: DC
  Filled 2024-01-19: qty 36, 95d supply, fill #0

## 2024-01-23 ENCOUNTER — Ambulatory Visit: Attending: Cardiovascular Disease | Admitting: Nurse Practitioner

## 2024-01-23 ENCOUNTER — Encounter: Payer: Self-pay | Admitting: Nurse Practitioner

## 2024-01-23 DIAGNOSIS — Z01810 Encounter for preprocedural cardiovascular examination: Secondary | ICD-10-CM

## 2024-01-23 NOTE — Progress Notes (Signed)
 Virtual Visit via Telephone Note   Because of CHURCHILL GERE co-morbid illnesses, he is at least at moderate risk for complications without adequate follow up.  This format is felt to be most appropriate for this patient at this time.  Due to technical limitations with video connection (technology), today's appointment will be conducted as an audio only telehealth visit, and ALDIS LAMORTE verbally agreed to proceed in this manner.   All issues noted in this document were discussed and addressed.  No physical exam could be performed with this format.  Evaluation Performed:  Preoperative cardiovascular risk assessment _____________   Date:  01/23/2024   Patient ID:  Alex Mccarty, DOB 1946/01/03, MRN [id number] Patient Location:  Home Provider location:   Office  Primary Care Provider:  Chancey Combe, [place]  E, PA-C Primary Cardiologist:  Dorothye Gathers, MD  Chief Complaint / Patient Profile   78 y.o. y/o male with a h/o CAD with history of large distal LM aneurysm being managed medically, PCI/DES to ostial RCA (4.0 x 12 mm) and PCI/DES to mid RCA (3.0 x 20 mm), CKD Stage IV currently on kidney transplant list, mild to moderate TR, HTN, former tobacco abuse, HLD, essential tremor, GERD who is pending EUS with Dr. Reina Cara at [facility] on 02/01/24 and presents today for telephonic preoperative cardiovascular risk assessment.  History of Present Illness    Alex Mccarty is a 78 y.o. male who presents via audio/video conferencing for a telehealth visit today.  Pt was last seen in cardiology clinic on 05/03/23 by Dr. Renna Cary.  At that time MARKECE PIZZIMENTI was doing well.  The patient is now pending procedure as outlined above. Since his last visit, he  denies chest pain, shortness of breath, lower extremity edema, fatigue, palpitations, melena, hematuria, hemoptysis, diaphoresis, weakness, presyncope, syncope, orthopnea, and PND. He remains active with regular riding of a recumbent tricycle  that has been outfitted for outdoors. He is able to achieve > 4 METS activity without concerning cardiac symptoms.   Past Medical History    Past Medical History:  Diagnosis Date   Arthritis    Bowel obstruction (HCC)    CKD (chronic kidney disease)    unkknown stage per patient   Coronary artery disease    Diabetes mellitus (HCC)    Hyperlipidemia    Hypertension    Kidney stones    Past Surgical History:  Procedure Laterality Date   CARDIAC CATHETERIZATION     COLONOSCOPY  10/2015   Dr Marvia Slocumb   COLOSTOMY REVISION Right 09/29/2017   Procedure: COLON RESECTION RIGHT;  Surgeon: Caralyn Chandler, MD;  Location: [facility] ORS;  Service: General;  Laterality: Right;   CORONARY STENT INTERVENTION N/A 01/28/2022   Procedure: CORONARY STENT INTERVENTION;  Surgeon: Lucendia Rusk, MD;  Location: [facility] INVASIVE CV LAB;  Service: Cardiovascular;  Laterality: N/A;   CORONARY ULTRASOUND/IVUS N/A 01/28/2022   Procedure: Intravascular Ultrasound/IVUS;  Surgeon: Lucendia Rusk, MD;  Location: [facility] INVASIVE CV LAB;  Service: Cardiovascular;  Laterality: N/A;   ELBOW SURGERY     ulnar nerve repair   ESOPHAGOGASTRODUODENOSCOPY     same time    FOOT SURGERY     crushed heel   LAPAROTOMY N/A 09/29/2017   Procedure: EXPLORATORY LAPAROTOMY;  Surgeon: Caralyn Chandler, MD;  Location: [facility] ORS;  Service: General;  Laterality: N/A;   LEFT HEART CATH N/A 01/28/2022   Procedure: Left Heart Cath;  Surgeon: Lucendia Rusk, MD;  Location: [facility] INVASIVE  CV LAB;  Service: Cardiovascular;  Laterality: N/A;   LEFT HEART CATH AND CORONARY ANGIOGRAPHY N/A 01/21/2022   Procedure: LEFT HEART CATH AND CORONARY ANGIOGRAPHY;  Surgeon: Lucendia Rusk, MD;  Location: [facility] INVASIVE CV LAB;  Service: Cardiovascular;  Laterality: N/A;    Allergies  Allergies  Allergen Reactions   Ticagrelor  Shortness Of Breath   Codeine Nausea And Vomiting   Hydrocodone Nausea And Vomiting   Hydromorphone      Confusion  (intolerance) Hallucinations   Penicillins Other (See Comments)    Childhood reaction Has patient had a PCN reaction causing immediate rash, facial/tongue/throat swelling, SOB or lightheadedness with hypotension: Unknown Has patient had a PCN reaction causing severe rash involving mucus membranes or skin necrosis: Unknown Has patient had a PCN reaction that required hospitalization: Unknown Has patient had a PCN reaction occurring within the last 10 years: Unknown If all of the above answers are "NO", then may proceed with Cephalosporin use.     Sulfa Antibiotics Nausea And Vomiting    Home Medications    Prior to Admission medications   Medication Sig Start Date End Date Taking? Authorizing Provider  acetaminophen  (TYLENOL ) 500 MG tablet Take 1,000 mg by mouth every 6 (six) hours as needed for moderate pain.    [provider]  amLODipine (NORVASC) 5 MG tablet Take 5 mg by mouth daily.    [provider]  amoxicillin  (AMOXIL ) 500 MG capsule Take 4 capsules (2,000 mg total) by mouth 1 hour prior to dental appointment. 05/27/23     atorvastatin  (LIPITOR ) 80 MG tablet Take 80 mg by mouth at bedtime.  02/23/14   [provider]  Cholecalciferol  (VITAMIN D3) 50 MCG (2000 UT) TABS Take 2,000 Units by mouth daily.    [provider]  clopidogrel  (PLAVIX ) 75 MG tablet Take 75 mg by mouth daily. 07/23/21   [provider]  cyclobenzaprine  (FLEXERIL ) 10 MG tablet Take 10 mg by mouth as needed (muscle pain).  Patient not taking: Reported on 01/15/2024    [provider]  empagliflozin (JARDIANCE) 25 MG TABS tablet 12.5 mg. 05/19/22   [provider]  famotidine -calcium  carbonate-magnesium  hydroxide (PEPCID  COMPLETE) 10-800-165 MG chewable tablet Chew 1 tablet by mouth daily as needed. Patient not taking: Reported on 01/15/2024    [provider]  gabapentin  (NEURONTIN ) 300 MG capsule Take 300 mg by mouth at bedtime.    [provider]  Glucosamine-Chondroitin (COSAMIN DS PO) Take 1 capsule by mouth at bedtime.    [provider]  insulin  glargine (LANTUS ) 100 UNIT/ML Solostar Pen Inject 40 Units under the skin daily. 06/05/23   Fulbright, [place]  E, PA-C  LANTUS  SOLOSTAR 100 UNIT/ML Solostar Pen Inject 40 Units into the skin every morning. 06/02/14   [provider]  LANTUS  SOLOSTAR 100 UNIT/ML Solostar Pen Inject 45 Units into the skin daily. 09/11/23     LANTUS  SOLOSTAR 100 UNIT/ML Solostar Pen Inject 38 Units into the skin every morning. 01/19/24     losartan  (COZAAR ) 50 MG tablet Take 75 mg by mouth daily. 04/05/23 04/04/24  [provider]  losartan  (COZAAR ) 50 MG tablet Take 1.5 tablets (75 mg total) by mouth daily. 06/02/23   Fulbright, [place]  E, PA-C  nitroGLYCERIN  (NITROSTAT ) 0.4 MG SL tablet Place 1 tablet (0.4 mg total) under the tongue every 5 (five) minutes as needed for chest pain. 11/23/23   Hugh Madura, MD  pantoprazole  (PROTONIX ) 40 MG tablet Take 1 tablet (40 mg total) by mouth  daily. 03/15/22   Hugh Madura, MD  propranolol  (INDERAL ) 20 MG tablet Take 20-40 mg by mouth every morning. Take 40 mg in the morning and 20 mg at lunch time    [provider]  tiZANidine  (ZANAFLEX ) 4 MG tablet Take 4 mg by mouth every 6 (six) hours as needed for muscle spasms. Patient not taking: Reported on 01/15/2024    [provider]  triamcinolone  cream (KENALOG ) 0.1 % Apply 1 Application topically 2 (two) times daily as needed (psoriasis).    [provider]  zinc gluconate 50 MG tablet Take 50 mg by mouth in the morning.    [provider]    Physical Exam    Vital Signs:  Alex Mccarty does not have vital signs available for review today.  Given telephonic nature of communication, physical exam is limited. AAOx3. NAD. Normal affect.  Speech and respirations are unlabored.  Accessory Clinical Findings    None  Assessment & Plan    1.   Preoperative Cardiovascular Risk Assessment: According to the Revised Cardiac Risk Index (RCRI), his Perioperative Risk of Major Cardiac Event is (%): 11 due to history of IHD, current CKD,and insulin  dependence. His Functional Capacity in METs is: 7.25 according to the Duke Activity Status Index (DASI). The patient is doing well from a cardiac perspective. Therefore, based on ACC/AHA guidelines, the patient would be at acceptable risk for the planned procedure without further cardiovascular testing.   The patient was advised that if he develops new symptoms prior to surgery to contact our office to arrange for a follow-up visit, and he verbalized understanding.  Per office protocol, he may hold Plavix  for 5 days prior to procedure and should resume as soon as hemodynamically stable postoperatively.   A copy of this note will be routed to requesting surgeon.  Time:   Today, I have spent 10 minutes with the patient with telehealth technology discussing medical history, symptoms, and management plan.     Gerldine Koch, NP-C  01/23/2024, 1:48 PM [address] [place], [place] [address] Office [phone] Fax [phone]

## 2024-01-29 ENCOUNTER — Other Ambulatory Visit (HOSPITAL_COMMUNITY): Payer: Self-pay

## 2024-03-04 ENCOUNTER — Other Ambulatory Visit (HOSPITAL_COMMUNITY): Payer: Self-pay

## 2024-03-04 ENCOUNTER — Other Ambulatory Visit: Payer: Self-pay

## 2024-04-15 ENCOUNTER — Other Ambulatory Visit: Payer: Self-pay

## 2024-04-15 ENCOUNTER — Other Ambulatory Visit (HOSPITAL_COMMUNITY): Payer: Self-pay

## 2024-04-15 MED ORDER — BACITRACIN-POLYMYXIN B 500-10000 UNIT/GM OP OINT
TOPICAL_OINTMENT | Freq: Every evening | OPHTHALMIC | 2 refills | Status: AC
  Filled 2024-04-15: qty 3.5, 30d supply, fill #0
  Filled 2024-04-15: qty 3.5, 2d supply, fill #0

## 2024-04-16 ENCOUNTER — Other Ambulatory Visit: Payer: Self-pay

## 2024-04-22 ENCOUNTER — Other Ambulatory Visit: Payer: Self-pay

## 2024-04-29 ENCOUNTER — Other Ambulatory Visit: Payer: Self-pay

## 2024-04-29 ENCOUNTER — Other Ambulatory Visit (HOSPITAL_COMMUNITY): Payer: Self-pay

## 2024-04-29 ENCOUNTER — Encounter (HOSPITAL_COMMUNITY): Payer: Self-pay

## 2024-04-29 MED ORDER — INSULIN DEGLUDEC FLEXTOUCH 100 UNIT/ML ~~LOC~~ SOPN
25.0000 [IU] | PEN_INJECTOR | Freq: Every day | SUBCUTANEOUS | 2 refills | Status: DC
  Filled 2024-04-29: qty 3, 12d supply, fill #0

## 2024-04-29 MED ORDER — INSULIN DEGLUDEC FLEXTOUCH 100 UNIT/ML ~~LOC~~ SOPN
25.0000 [IU] | PEN_INJECTOR | Freq: Every day | SUBCUTANEOUS | 0 refills | Status: DC
  Filled 2024-04-29: qty 24, 96d supply, fill #0

## 2024-04-30 ENCOUNTER — Other Ambulatory Visit (HOSPITAL_COMMUNITY): Payer: Self-pay

## 2024-05-16 ENCOUNTER — Other Ambulatory Visit (HOSPITAL_COMMUNITY): Payer: Self-pay

## 2024-05-28 ENCOUNTER — Ambulatory Visit: Attending: Cardiology | Admitting: Cardiology

## 2024-05-28 ENCOUNTER — Encounter: Payer: Self-pay | Admitting: Cardiology

## 2024-05-28 VITALS — BP 148/76 | HR 66 | Ht 69.0 in | Wt 177.0 lb

## 2024-05-28 DIAGNOSIS — E782 Mixed hyperlipidemia: Secondary | ICD-10-CM

## 2024-05-28 DIAGNOSIS — I2510 Atherosclerotic heart disease of native coronary artery without angina pectoris: Secondary | ICD-10-CM | POA: Diagnosis not present

## 2024-05-28 DIAGNOSIS — N184 Chronic kidney disease, stage 4 (severe): Secondary | ICD-10-CM | POA: Diagnosis not present

## 2024-05-28 DIAGNOSIS — Z95820 Peripheral vascular angioplasty status with implants and grafts: Secondary | ICD-10-CM | POA: Diagnosis not present

## 2024-05-28 DIAGNOSIS — E1121 Type 2 diabetes mellitus with diabetic nephropathy: Secondary | ICD-10-CM

## 2024-05-28 MED ORDER — NITROGLYCERIN 0.4 MG SL SUBL: 0.4000 mg | SUBLINGUAL_TABLET | SUBLINGUAL | 3 refills | Status: AC | PRN

## 2024-05-28 NOTE — Patient Instructions (Signed)

## 2024-05-28 NOTE — Progress Notes (Signed)
 Cardiology Office Note:  .   Date:  05/28/2024  ID:  Alex Mccarty, DOB 1945-09-29, MRN [id number] PCP: Godfrey Area, MD  [facility] Providers Cardiologist:  Oneil Parchment, MD     History of Present Illness: .   Alex Mccarty is a 78 y.o. male Discussed the use of AI scribe software for clinical note transcription with the patient, who gave verbal consent to proceed.  History of Present Illness Alex Mccarty is a 78 year old male with coronary artery disease who presents for follow-up.  He underwent a staged cardiac catheterization procedure with two stents placed in the right coronary artery in May 2023. 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 No chest pain is reported, but there is occasional discomfort in the lower left chest. He is currently on atorvastatin  80 mg and Plavix  75 mg, having stopped aspirin . His LDL cholesterol was 55 at the last check.  He has chronic kidney disease, stage four, with a creatinine level of approximately 3.5 and an EGFR of 18. He is on the kidney transplant list at [facility] in [place] and is pursuing activation on the list at [facility]. He is seeing a nephrologist and a renal nutritionist to manage his condition. Issues with elevated potassium levels are noted.  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 He underwent an endoscopic procedure, and the cyst is currently stable.  He experiences sleep disturbances, waking up at 3 AM, and has tried various CPAP masks without success. He is considering discussing alternative treatments with his primary care physician.  He is a former smoker, having quit 40 years ago, and is currently enjoying physical activity by riding a recumbent trike, which he finds beneficial for his well-being.     Studies Reviewed: SABRA   EKG  Interpretation Date/Time:  Tuesday May 28 2024 09:53:44 EDT Ventricular Rate:  59 PR Interval:  186 QRS Duration:  70 QT Interval:  386 QTC Calculation: 382 R Axis:   22  Text Interpretation: Sinus bradycardia Poor R wave progression When compared with ECG of 03-May-2023 09:19, No significant change since last tracing Confirmed by Parchment Oneil ([id number]) on 05/28/2024 10:08:58 AM    Results LABS Creatinine: 3.5 LDL cholesterol: 55 EGFR: 18 A1c: 6.4  RADIOLOGY Vascular screening of abdominal aorta: Normal (2023)  DIAGNOSTIC Echocardiogram: EF 65-70% Risk Assessment/Calculations:           Physical Exam:   VS:  BP (!) 148/76   Pulse 66   Ht 5' 9 (1.753 m)   Wt 177 lb (80.3 kg)   SpO2 98%   BMI 26.14 kg/m    Wt Readings from Last 3 Encounters:  05/28/24 177 lb (80.3 kg)  05/03/23 182 lb (82.6 kg)  03/03/23 182 lb (82.6 kg)    GEN: Well nourished, well developed in no acute distress NECK: No JVD; No carotid bruits CARDIAC: RRR, no murmurs, no rubs, no gallops RESPIRATORY:  Clear to auscultation without rales, wheezing or rhonchi  ABDOMEN: Soft, non-tender, non-distended EXTREMITIES:  No edema; No deformity   ASSESSMENT AND PLAN: .    Assessment and Plan Assessment & Plan Coronary artery disease with prior stents and left main coronary artery aneurysm Coronary artery disease with prior stents in the right coronary artery and a large distal left main aneurysm with multiple vessels originating  from it, managed medically. No new chest pain, occasional musculoskeletal discomfort in the lower left chest. LDL cholesterol at 55 mg/dL. Continued on Plavix  monotherapy after stopping aspirin . The aneurysm is a low-pressure system, and surgical intervention is not recommended due to potential complications. - Continue Plavix  75 mg daily - Continue atorvastatin  80 mg daily - Monitor for any new symptoms or changes in chest pain  Chronic kidney disease stage 4, on transplant  list Chronic kidney disease stage 4 with creatinine levels around 3.5 mg/dL and eGFR of 18. On the kidney transplant list at [facility] in [place] and pursuing activation at [facility]. Potassium levels are a concern due to decreased kidney function. Seeing a renal nutritionist to manage dietary restrictions related to diabetes, heart condition, and kidney disease. Nephrologist monitoring potassium levels closely, potential for potassium-binding medication if levels become concerning. - Continue monitoring creatinine and potassium levels - Avoid high potassium foods - Consult with nephrologist for potential potassium-binding medication if levels become concerning - Continue seeing renal nutritionist  Type 2 diabetes mellitus Type 2 diabetes mellitus with A1c at 6.4%. - Continue current diabetes management regimen  Hyperlipidemia Hyperlipidemia managed with atorvastatin  80 mg daily. LDL cholesterol at 55 mg/dL. - Continue atorvastatin  80 mg daily         Dispo: 1 yr  Signed, Oneil Parchment, MD

## 2024-07-03 ENCOUNTER — Other Ambulatory Visit (HOSPITAL_COMMUNITY): Payer: Self-pay

## 2024-07-04 ENCOUNTER — Other Ambulatory Visit (HOSPITAL_COMMUNITY): Payer: Self-pay

## 2024-07-04 MED ORDER — TRESIBA FLEXTOUCH 100 UNIT/ML ~~LOC~~ SOPN
25.0000 [IU] | PEN_INJECTOR | Freq: Every day | SUBCUTANEOUS | 0 refills | Status: DC
  Filled 2024-07-04: qty 24, 96d supply, fill #0

## 2024-07-09 ENCOUNTER — Other Ambulatory Visit (HOSPITAL_COMMUNITY): Payer: Self-pay

## 2024-07-12 ENCOUNTER — Other Ambulatory Visit (HOSPITAL_COMMUNITY): Payer: Self-pay

## 2024-07-12 ENCOUNTER — Other Ambulatory Visit: Payer: Self-pay

## 2024-10-12 ENCOUNTER — Other Ambulatory Visit (HOSPITAL_COMMUNITY): Payer: Self-pay

## 2024-10-14 ENCOUNTER — Other Ambulatory Visit (HOSPITAL_COMMUNITY): Payer: Self-pay

## 2024-10-14 MED ORDER — TRESIBA FLEXTOUCH 100 UNIT/ML ~~LOC~~ SOPN
PEN_INJECTOR | SUBCUTANEOUS | 0 refills | Status: DC
  Filled 2024-10-14: qty 24, 96d supply, fill #0
